# Patient Record
Sex: Female | Born: 1935
Health system: Southern US, Community
[De-identification: ages and names within clinical notes are randomized; demographics above are authoritative.]

## PROBLEM LIST (undated history)

## (undated) DIAGNOSIS — F418 Other specified anxiety disorders: Secondary | ICD-10-CM

## (undated) DIAGNOSIS — D649 Anemia, unspecified: Secondary | ICD-10-CM

## (undated) DIAGNOSIS — E871 Hypo-osmolality and hyponatremia: Secondary | ICD-10-CM

## (undated) DIAGNOSIS — R600 Localized edema: Secondary | ICD-10-CM

## (undated) DIAGNOSIS — Z8619 Personal history of other infectious and parasitic diseases: Secondary | ICD-10-CM

## (undated) DIAGNOSIS — Z5189 Encounter for other specified aftercare: Secondary | ICD-10-CM

## (undated) DIAGNOSIS — M1990 Unspecified osteoarthritis, unspecified site: Secondary | ICD-10-CM

## (undated) DIAGNOSIS — H269 Unspecified cataract: Secondary | ICD-10-CM

## (undated) DIAGNOSIS — L0390 Cellulitis, unspecified: Secondary | ICD-10-CM

## (undated) DIAGNOSIS — F419 Anxiety disorder, unspecified: Secondary | ICD-10-CM

## (undated) DIAGNOSIS — E669 Obesity, unspecified: Secondary | ICD-10-CM

## (undated) DIAGNOSIS — I739 Peripheral vascular disease, unspecified: Secondary | ICD-10-CM

## (undated) DIAGNOSIS — I10 Essential (primary) hypertension: Secondary | ICD-10-CM

## (undated) DIAGNOSIS — R32 Unspecified urinary incontinence: Secondary | ICD-10-CM

## (undated) DIAGNOSIS — K219 Gastro-esophageal reflux disease without esophagitis: Secondary | ICD-10-CM

## (undated) DIAGNOSIS — E785 Hyperlipidemia, unspecified: Secondary | ICD-10-CM

## (undated) DIAGNOSIS — K449 Diaphragmatic hernia without obstruction or gangrene: Secondary | ICD-10-CM

## (undated) HISTORY — PX: TRACHEOSTOMY CLOSURE: SHX458

## (undated) HISTORY — DX: Personal history of other infectious and parasitic diseases: Z86.19

## (undated) HISTORY — DX: Encounter for other specified aftercare: Z51.89

## (undated) HISTORY — DX: Anxiety disorder, unspecified: F41.9

## (undated) HISTORY — DX: Gastro-esophageal reflux disease without esophagitis: K21.9

## (undated) HISTORY — PX: APPENDECTOMY: SHX54

## (undated) HISTORY — DX: Unspecified cataract: H26.9

## (undated) HISTORY — DX: Hypo-osmolality and hyponatremia: E87.1

## (undated) HISTORY — PX: TONSILLECTOMY: SUR1361

## (undated) HISTORY — PX: OTHER SURGICAL HISTORY: SHX169

## (undated) HISTORY — PX: TRACHEOSTOMY: SUR1362

## (undated) HISTORY — DX: Essential (primary) hypertension: I10

## (undated) HISTORY — PX: SKIN GRAFT: SHX250

## (undated) HISTORY — DX: Diaphragmatic hernia without obstruction or gangrene: K44.9

## (undated) HISTORY — DX: Hyperlipidemia, unspecified: E78.5

## (undated) HISTORY — DX: Unspecified urinary incontinence: R32

## (undated) HISTORY — DX: Localized edema: R60.0

## (undated) HISTORY — DX: Cellulitis, unspecified: L03.90

## (undated) HISTORY — DX: Anemia, unspecified: D64.9

## (undated) HISTORY — DX: Obesity, unspecified: E66.9

## (undated) HISTORY — PX: JEJUNOSTOMY FEEDING TUBE: SUR737

## (undated) HISTORY — DX: Peripheral vascular disease, unspecified: I73.9

## (undated) HISTORY — DX: Other specified anxiety disorders: F41.8

## (undated) HISTORY — PX: COSMETIC SURGERY: SHX468

## (undated) HISTORY — DX: Unspecified osteoarthritis, unspecified site: M19.90

---

## 2012-09-16 ENCOUNTER — Ambulatory Visit (INDEPENDENT_AMBULATORY_CARE_PROVIDER_SITE_OTHER): Payer: Medicare Other | Admitting: Family Medicine

## 2012-09-16 VITALS — BP 133/68 | HR 72 | Temp 97.7°F | Resp 16 | Ht 64.0 in | Wt 226.4 lb

## 2012-09-16 DIAGNOSIS — L02419 Cutaneous abscess of limb, unspecified: Secondary | ICD-10-CM

## 2012-09-16 DIAGNOSIS — I8310 Varicose veins of unspecified lower extremity with inflammation: Secondary | ICD-10-CM

## 2012-09-16 DIAGNOSIS — I872 Venous insufficiency (chronic) (peripheral): Secondary | ICD-10-CM

## 2012-09-16 DIAGNOSIS — L03115 Cellulitis of right lower limb: Secondary | ICD-10-CM

## 2012-09-16 DIAGNOSIS — R600 Localized edema: Secondary | ICD-10-CM

## 2012-09-16 DIAGNOSIS — R609 Edema, unspecified: Secondary | ICD-10-CM

## 2012-09-16 MED ORDER — NYSTATIN 100000 UNIT/GM EX CREA: TOPICAL_CREAM | Freq: Two times a day (BID) | CUTANEOUS | Status: DC
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
## 2012-09-16 NOTE — Patient Instructions (Signed)
Primary care: [email or web address]  Continue clindamycin 4 times per day as prescribed by your previous doctor,  Keep legs elevated as able, compressive stockings, and ok to use nystatin cream twice per day. Lasix as prescribed by your previous primary provider.  Recheck in 2 days. Return to the clinic or go to the nearest emergency room if any of your symptoms worsen or new symptoms occur.   Cellulitis Cellulitis is an infection of the skin and the tissue beneath it. The infected area is usually red and tender. Cellulitis occurs most often in the arms and lower legs.  CAUSES  Cellulitis is caused by bacteria that enter the skin through cracks or cuts in the skin. The most common types of bacteria that cause cellulitis are Staphylococcus and Streptococcus. SYMPTOMS   Redness and warmth.  Swelling.  Tenderness or pain.  Fever. DIAGNOSIS  Your caregiver can usually determine what is wrong based on a physical exam. Blood tests may also be done. TREATMENT  Treatment usually involves taking an antibiotic medicine. HOME CARE INSTRUCTIONS   Take your antibiotics as directed. Finish them even if you start to feel better.  Keep the infected arm or leg elevated to reduce swelling.  Apply a warm cloth to the affected area up to 4 times per day to relieve pain.  Only take over-the-counter or prescription medicines for pain, discomfort, or fever as directed by your caregiver.  Keep all follow-up appointments as directed by your caregiver. SEEK MEDICAL CARE IF:   You notice red streaks coming from the infected area.  Your red area gets larger or turns dark in color.  Your bone or joint underneath the infected area becomes painful after the skin has healed.  Your infection returns in the same area or another area.  You notice a swollen bump in the infected area.  You develop new symptoms. SEEK IMMEDIATE MEDICAL CARE IF:   You have a fever.  You feel  very sleepy.  You develop vomiting or diarrhea.  You have a general ill feeling (malaise) with muscle aches and pains. MAKE SURE YOU:   Understand these instructions.  Will watch your condition.  Will get help right away if you are not doing well or get worse. Document Released: 11/03/2004 Document Revised: 07/26/2011 Document Reviewed: 04/11/2011 [facility] Patient Information 2014 [place], [place].   Stasis Dermatitis Stasis dermatitis occurs when veins lose the ability to pump blood back to the heart (poor venous circulation). It causes a reddish-purple to brownish scaly, itchy rash on the legs. The rash comes from pooling of blood (stasis). CAUSES  This occurs because the veins do not work very well anymore or because pressure may be increased in the veins due to other conditions. With blood pooling, the increased pressure in the tiny blood vessels (capillaries) causes fluid to leak out of the capillaries into the tissue. The extra fluid makes it harder for the blood to feed the cells and get rid of waste products. SYMPTOMS  Stasis dermatitis appears as red, scaly, itchy patches on the legs. A yellowish or light brown discoloration is also present. Due to scratching or other injury, these patches can become an ulcer. This ulcer may remain for long periods of time. The ulcer can also become infected. Swelling of the legs is often present with stasis dermatitis. If the leg is swollen, this increases the risk of infection and further damage to the skin. Sometimes, intense itching, tingling, and burning occurs before signs of stasis dermatitis appear. You may find  yourself scratching the insides of your ankles or rubbing your ankles together before the rash appears. After healing, there are often brown spots on the affected skin. DIAGNOSIS  Your caregiver makes this diagnosis based on an exam. Other tests may be done to better understand the cause. TREATMENT  If underlying conditions are  present, they must be treated. Some of these conditions are heart failure, thyroid problems, poor nutrition, and varicose veins.  Cortisone creams and ointments applied to the skin (topically) may be needed, as well as medicine to reduce swelling in the legs (diuretics).  Compression stockings or an elastic wrap may also be needed to reduce swelling.  If there is an infection, antibiotic medicines may also be used. HOME CARE INSTRUCTIONS   Try to rest and raise (elevate) the affected leg above the level of the heart, if possible.  Burow's solution wet packs applied for 30 minutes, 3 times daily, will help the weepy rash. Stop using the packs before your skin gets too dry. You can also use a mixture of 3 parts white vinegar to 1 quart water.  Grease your legs daily with ointments, such as petroleum jelly, to fight dryness.  Avoid scratching or injuring the area. SEEK IMMEDIATE MEDICAL CARE IF:   Your rash gets worse.  An ulcer forms.  You have an oral temperature above 102 F (38.9 C), not controlled by medicine.  You have any other severe symptoms. Document Released: 05/05/2005 Document Revised: 04/18/2011 Document Reviewed: 06/15/2009 [facility] Patient Information 2014 [place], [place].

## 2012-09-18 ENCOUNTER — Ambulatory Visit (INDEPENDENT_AMBULATORY_CARE_PROVIDER_SITE_OTHER): Payer: Medicare Other | Admitting: Family Medicine

## 2012-09-18 VITALS — BP 130/84 | HR 69 | Temp 97.7°F | Resp 18 | Ht 64.0 in | Wt 225.0 lb

## 2012-09-18 DIAGNOSIS — L02419 Cutaneous abscess of limb, unspecified: Secondary | ICD-10-CM

## 2012-09-18 DIAGNOSIS — I8310 Varicose veins of unspecified lower extremity with inflammation: Secondary | ICD-10-CM

## 2012-09-18 DIAGNOSIS — R600 Localized edema: Secondary | ICD-10-CM

## 2012-09-18 DIAGNOSIS — L03119 Cellulitis of unspecified part of limb: Secondary | ICD-10-CM

## 2012-09-18 DIAGNOSIS — I872 Venous insufficiency (chronic) (peripheral): Secondary | ICD-10-CM

## 2012-09-18 DIAGNOSIS — E871 Hypo-osmolality and hyponatremia: Secondary | ICD-10-CM

## 2012-09-18 DIAGNOSIS — R609 Edema, unspecified: Secondary | ICD-10-CM

## 2012-09-18 LAB — BASIC METABOLIC PANEL
BUN: 21 mg/dL (ref 6–23)
CO2: 27 mEq/L (ref 19–32)
Chloride: 96 mEq/L (ref 96–112)
Creat: 0.79 mg/dL (ref 0.50–1.10)
Glucose, Bld: 95 mg/dL (ref 70–99)
Potassium: 4.3 mEq/L (ref 3.5–5.3)

## 2012-09-18 MED ORDER — FUROSEMIDE 20 MG PO TABS: 20.0000 mg | ORAL_TABLET | ORAL | Status: DC

## 2012-09-18 NOTE — Progress Notes (Signed)
  Subjective:    Patient ID: Chelsea Browning, female    DOB: 22-Sep-1932, 79 y.o.   MRN: [id number]  HPI Chelsea Browning is a 79 y.o. female See last ov 2 days ago - here for follow up stasis dermatitis/cellulitis.  Using the nystatin cream - feels like this is helping.  Taking clindamycin since 09/15/12 - has had C diff in January of  last year, resolved with Flagyl. Taking yogurt and acidophilus.  No fever. Redness lessening in legs, less warm.    Only took 1 lasix. Ran out - needs refill. Has had low sodium in past - had to have bag of sodium here 2 years ago. Na 133 July 7th at PCP - plan to recheck in September. On water restriction for hyponatremia. Usually feels fatigued if it runs low. Last creatinine in normal range in July. No known hx of kidney disease. Was taking lasix about twice per week. - prior recommendation 3 x/week.    Has appt October 28th with [facility].   Review of Systems  Constitutional: Negative for fever, chills and fatigue.  Respiratory: Negative for shortness of breath.   Cardiovascular: Positive for leg swelling.  Gastrointestinal: Negative for abdominal pain and diarrhea.  Skin: Positive for color change.         Objective:   Physical Exam  Vitals reviewed. Constitutional: She is oriented to person, place, and time. She appears well-developed and well-nourished. No distress.  HENT:  Head: Normocephalic and atraumatic.  Eyes: Conjunctivae and EOM are normal. Pupils are equal, round, and reactive to light.  Neck: Carotid bruit is not present.  Cardiovascular:  Pulses:      Dorsalis pedis pulses are 1+ on the right side, and 1+ on the left side.  Pulmonary/Chest: Effort normal.  Abdominal: Soft. She exhibits no pulsatile midline mass. There is no tenderness.  Neurological: She is alert and oriented to person, place, and time.  Skin: Skin is warm and dry. There is erythema.     Psychiatric: She has a normal mood and affect. Her behavior is  normal.       Assessment & Plan:  Chelsea Browning is a 79 y.o. female Leg edema - Plan: furosemide (LASIX) 20 MG tablet 2x/week for now - may need additional dosing, but with hx of hyponatremia - check BMP, stay at current dosing for now.   Stasis dermatitis of both legs, with cellulitis - improving. Continue clindamycin and nystatin top for now - goal of 7-10 days of treatment.  rtc if any diarrhea as hx of cdiff prior.    Hx of hyponatremia - asx currently and prior Na 133.  BMP as above. rtc precautions.    Meds ordered this encounter  Medications  . furosemide (LASIX) 20 MG tablet    Sig: Take 1 tablet (20 mg total) by mouth 2 (two) times a week.    Dispense:  30 tablet    Refill:  0   Patient Instructions  Continue clindamycin for now.  Recheck Saturday 09/15/12 8am - 2pm, or Sunday 09/16/12 10am - 4pm.  Lasix sent to your pharmacy - 2 times per week for now. Stockings as discussed. Elevate legs, ok to continue nystatin cream and clindamycin for now. If any diarrhea - return to clinic to discuss.  Return to the clinic or go to the nearest emergency room if any of your symptoms worsen or new symptoms occur.

## 2012-09-18 NOTE — Patient Instructions (Addendum)
Continue clindamycin for now.  Recheck Saturday 09/15/12 8am - 2pm, or Sunday 09/16/12 10am - 4pm.  Lasix sent to your pharmacy - 2 times per week for now. Stockings as discussed. Elevate legs, ok to continue nystatin cream and clindamycin for now. If any diarrhea - return to clinic to discuss.  Return to the clinic or go to the nearest emergency room if any of your symptoms worsen or new symptoms occur.

## 2012-09-20 ENCOUNTER — Telehealth: Payer: Self-pay

## 2012-09-20 NOTE — Telephone Encounter (Signed)
Patient called concerned over lab results. Was told she would receive a call today. Please call at [phone].

## 2012-09-21 ENCOUNTER — Encounter: Payer: Self-pay | Admitting: Family Medicine

## 2012-09-21 NOTE — Telephone Encounter (Signed)
Please advise on lab results.

## 2012-09-21 NOTE — Telephone Encounter (Signed)
See lab notes. Thanks.  

## 2012-09-23 ENCOUNTER — Ambulatory Visit (INDEPENDENT_AMBULATORY_CARE_PROVIDER_SITE_OTHER): Payer: Medicare Other | Admitting: Family Medicine

## 2012-09-23 VITALS — BP 156/70 | HR 69 | Temp 98.9°F | Resp 16 | Ht 64.0 in | Wt 226.0 lb

## 2012-09-23 DIAGNOSIS — L0291 Cutaneous abscess, unspecified: Secondary | ICD-10-CM

## 2012-09-23 DIAGNOSIS — I8310 Varicose veins of unspecified lower extremity with inflammation: Secondary | ICD-10-CM

## 2012-09-23 DIAGNOSIS — E876 Hypokalemia: Secondary | ICD-10-CM

## 2012-09-23 DIAGNOSIS — L0390 Cellulitis, unspecified: Secondary | ICD-10-CM

## 2012-09-23 DIAGNOSIS — E871 Hypo-osmolality and hyponatremia: Secondary | ICD-10-CM

## 2012-09-23 LAB — BASIC METABOLIC PANEL WITH GFR
BUN: 29 mg/dL — ABNORMAL HIGH (ref 6–23)
CO2: 23 meq/L (ref 19–32)
Calcium: 9.1 mg/dL (ref 8.4–10.5)
Chloride: 100 meq/L (ref 96–112)
Creat: 0.93 mg/dL (ref 0.50–1.10)
Glucose, Bld: 114 mg/dL — ABNORMAL HIGH (ref 70–99)
Potassium: 4.8 meq/L (ref 3.5–5.3)
Sodium: 129 meq/L — ABNORMAL LOW (ref 135–145)

## 2012-09-23 NOTE — Progress Notes (Signed)
Subjective:    Patient ID: Chelsea Browning, female    DOB: 09-17-1932, 79 y.o.   MRN: [id number]  HPI Chelsea Browning is a 79 y.o. female See prior ov's.   Stasis dermatitis/cellulitis of legs.  Last ov 09/18/12. Improving/stable then on clindamycin and nystatin topical.  Feels much better.  Less red, no fever, not as warm.  Still taking clindamycin -s/p 1 week of this. No diarrhea. Still taking probiotic yogurt.   Hyponatremia - borderline low in past as well.  See prior labs below.  Here for follow up. Feels tired at times. Lowest in past 128or 129? Has only taken 1 lasix since last ov.     Results for orders placed in visit on 09/18/12  BASIC METABOLIC PANEL      Result Value Range   Sodium 130 (*) 135 - 145 mEq/L   Potassium 4.3  3.5 - 5.3 mEq/L   Chloride 96  96 - 112 mEq/L   CO2 27  19 - 32 mEq/L   Glucose, Bld 95  70 - 99 mg/dL   BUN 21  6 - 23 mg/dL   Creat 4.78  2.95 - 6.21 mg/dL   Calcium 9.8  8.4 - 30.8 mg/dL   Review of Systems  Constitutional: Negative for fever and chills.  Respiratory: Negative for shortness of breath.   Musculoskeletal: Negative for myalgias.  Skin: Positive for rash (less redness. ).       Objective:   Physical Exam  Vitals reviewed. Constitutional: She is oriented to person, place, and time. She appears well-developed and well-nourished.  HENT:  Head: Normocephalic.  Pulmonary/Chest: Effort normal.  Musculoskeletal: She exhibits edema (1 + le's bilaterally. ).  Neurological: She is alert and oriented to person, place, and time.  Skin: Skin is warm and dry. There is erythema (min erythema with stasis changes of lower legs, dry skin , but without warmth. few dry abraded areas on l lower leg. ).  Psychiatric: She has a normal mood and affect. Her behavior is normal.        Assessment & Plan:  Chelsea Browning is a 79 y.o. female Stasis dermatitis, unspecified laterality Cellulitis - improving.  Less warmth and symptomatically feels like  at baseline. continue clindamycin for 3 more days, nystatin topical for 3 more days (for total 10 days treatment).  Continue eucerin or aveeno for dry skin, compression stockings, elevation, and rtc precautions.   Hyponatremia - Plan: Basic metabolic panel.  Ok to hold on lasix for now as LE's appear improved. Recheck BMP, then decide on further workup. ER/RTC precautions.   Discussed primary care options - has paperwork for [facility], but also requested info on provider associated with teaching hospital.  Numbers given for [facility] and Internal Medicine teaching program. Can also check into [facility] if needed.   Patient Instructions  You should receive a call or letter about your lab results within the next week to 10 days.  3 more days of clindamycin and nystatin, then recheck if not continuing to improve or redness returns. continue eucerin or Aveeno for dry skin, elevate legs, stockings for compression.  Can hold Lasix for now until result of sodium level.  [facility]: [phone]. Can call to determine if they are taking new patients as another option for primary care. There is also an Internal Medicine Residency Program at [facility] - the main hospital number is [phone]. Return to the clinic or go to the nearest emergency room if any  of your symptoms worsen or new symptoms occur.

## 2012-09-23 NOTE — Patient Instructions (Addendum)
You should receive a call or letter about your lab results within the next week to 10 days.  3 more days of clindamycin and nystatin, then recheck if not continuing to improve or redness returns. continue eucerin or Aveeno for dry skin, elevate legs, stockings for compression.  Can hold Lasix for now until result of sodium level.  [facility]: [phone]. Can call to determine if they are taking new patients as another option for primary care. There is also an Internal Medicine Residency Program at [facility] - the main hospital number is [phone]. Return to the clinic or go to the nearest emergency room if any of your symptoms worsen or new symptoms occur.

## 2012-09-25 NOTE — Telephone Encounter (Signed)
Pt calling again regarding labs.   [phone]

## 2012-09-26 ENCOUNTER — Telehealth: Payer: Self-pay

## 2012-09-26 NOTE — Telephone Encounter (Signed)
Pt wants to know her lab results she is very anxious she says!!! Call back number is [phone] or [phone]

## 2012-09-26 NOTE — Telephone Encounter (Signed)
Dr Neva Seat please review and comment.

## 2012-09-27 ENCOUNTER — Ambulatory Visit (INDEPENDENT_AMBULATORY_CARE_PROVIDER_SITE_OTHER): Payer: Medicare Other | Admitting: Family Medicine

## 2012-09-27 VITALS — BP 140/50 | HR 68 | Temp 98.6°F | Resp 17 | Ht 64.0 in | Wt 227.0 lb

## 2012-09-27 DIAGNOSIS — R600 Localized edema: Secondary | ICD-10-CM

## 2012-09-27 DIAGNOSIS — E871 Hypo-osmolality and hyponatremia: Secondary | ICD-10-CM

## 2012-09-27 DIAGNOSIS — R609 Edema, unspecified: Secondary | ICD-10-CM

## 2012-09-27 NOTE — Progress Notes (Signed)
Subjective:    Patient ID: Chelsea Browning, female    DOB: April 05, 1932, 79 y.o.   MRN: [id number]  HPI Chelsea Browning is a 79 y.o. female  See prior office visits. intially seen 09/16/12 for LE cellulitis/stasis dermatitis after driving from [place]. Prior primary care provider in [place] and will be establishing primary care locally. Stasis derm/cellulitis improving last ov, but asked to return for discussion of hyponatremia. Still on clindamycin and nystatin cream - legs much better. Not much swelling. Stopping antibiotics tonight. appt with PCP on Sept 28th?  Hyponatremia - apparetnly Na 133 at PCP office in [facility] July 7th, then 130 on 09/18/12 here. Noted to me it has been low in past as well, with lowest in past 118? Had only taken 1 lasix prior to last ov, and held since then. Na 129 on 09/23/12. Has taken lasix a total of 3 times since moving him. Feels fatigued, but no headache, n/v, or diarrhea.  No hx of seizures. Decreased energy. Similar to when low in the past - lowest 118. Had sodium infusion then in emergency room.  No trouble breathing, no chest pain. Has had low sodium for about 4-5 years and has been on fluid restriction of 4 glasses of water during the day,    Urinary frequency at night with leaking - chronic, started on Detrol by OBGYN few years ago, not taking recently (no Detrol LA  in months).    Takes Persantine twice per day, mother had carotid artery blockage.  Has been on persantine for few years for prevention of Carotid artery disease, started by primary care doctor few years. Has ultrasound of carotids yearly, but has been ok by report - about 12-13%. No hx of CHF.  Results for orders placed in visit on 09/23/12  BASIC METABOLIC PANEL      Result Value Range   Sodium 129 (*) 135 - 145 mEq/L   Potassium 4.8  3.5 - 5.3 mEq/L   Chloride 100  96 - 112 mEq/L   CO2 23  19 - 32 mEq/L   Glucose, Bld 114 (*) 70 - 99 mg/dL   BUN 29 (*) 6 - 23 mg/dL   Creat 4.09  8.11 - 9.14 mg/dL   Calcium 9.1  8.4 - 78.2 mg/dL    Review of Systems  Constitutional: Positive for fatigue. Negative for fever and chills.  Respiratory: Negative for cough and shortness of breath.   Cardiovascular: Negative for chest pain.   And as above.       Objective:   Physical Exam  Vitals reviewed. Constitutional: She is oriented to person, place, and time. She appears well-developed and well-nourished.  HENT:  Head: Normocephalic and atraumatic.  Eyes: Conjunctivae and EOM are normal. Pupils are equal, round, and reactive to light.  Neck: Carotid bruit is not present.  Cardiovascular: Normal rate, regular rhythm, normal heart sounds and intact distal pulses.   Pulmonary/Chest: Effort normal and breath sounds normal. She has no wheezes. She has no rales.  Abdominal: Soft. She exhibits no pulsatile midline mass. There is no tenderness.  Musculoskeletal: She exhibits edema (1-2+ le pretibial with stasis changes, min erythema without apparent warmth. ).  Neurological: She is alert and oriented to person, place, and time.  Skin: Skin is warm and dry.  Psychiatric: She has a normal mood and affect. Her behavior is normal.      Assessment & Plan:  Chelsea Browning is a 79 y.o. female Hyponatremia - Plan: Comprehensive metabolic panel, TSH  Bilateral leg edema - Plan: Comprehensive metabolic panel, TSH   Hyponatremia - mild to moderate on recent testing, and only symptom of fatigue.  Chronic by history, and fluid restriction in past.  Consider hypertonic saline/ER eval if worsening levels, or new/worsening symptoms as only fatigue at present.  If worsening ha, n/v, or other worsening - to go to ER. Will check CMP (to look for cirrhosis and recheck Na) and TSH.  Consider BNP and other testing if these are normal. understanding expressed. Continue to avoid lasix.   Le edema improved. Can stop clindamycin, but watch for worsening, rtc precautions.   Outside records not available, and unsure of  reason for Persantine other than preventive with FH of Carotid stenosis (but by her report the recent ultrasound no obstruction), but did not see that this medicine contributes to hyponatremia. Can also be discussed with PCP.   Can discuss with PCP re: detrol LA.    Rtc/er precautions discussed.    Patient Instructions  You should receive a call or letter about your lab results within the next 4-5 days.  If any nausea, vomiting, headache, diarrhea, or worsening of fatigue (or other new worsening symptoms) go to the emergency room. Fluid restriction as discussed. No lasix for now.

## 2012-09-27 NOTE — Patient Instructions (Signed)
You should receive a call or letter about your lab results within the next 4-5 days.  If any nausea, vomiting, headache, diarrhea, or worsening of fatigue (or other new worsening symptoms) go to the emergency room. Fluid restriction as discussed. No lasix for now.

## 2012-09-28 LAB — COMPREHENSIVE METABOLIC PANEL
ALT: 16 U/L (ref 0–35)
Alkaline Phosphatase: 105 U/L (ref 39–117)
Creat: 0.85 mg/dL (ref 0.50–1.10)
Sodium: 135 mEq/L (ref 135–145)
Total Bilirubin: 0.5 mg/dL (ref 0.3–1.2)
Total Protein: 6.2 g/dL (ref 6.0–8.3)

## 2012-09-28 LAB — TSH: TSH: 1.043 u[IU]/mL (ref 0.350–4.500)

## 2012-09-29 ENCOUNTER — Other Ambulatory Visit: Payer: Self-pay | Admitting: Family Medicine

## 2012-09-29 DIAGNOSIS — E871 Hypo-osmolality and hyponatremia: Secondary | ICD-10-CM

## 2012-10-13 ENCOUNTER — Other Ambulatory Visit (INDEPENDENT_AMBULATORY_CARE_PROVIDER_SITE_OTHER): Payer: Medicare Other

## 2012-10-13 DIAGNOSIS — E871 Hypo-osmolality and hyponatremia: Secondary | ICD-10-CM

## 2012-10-13 LAB — BASIC METABOLIC PANEL
CO2: 27 mEq/L (ref 19–32)
Calcium: 9.2 mg/dL (ref 8.4–10.5)
Chloride: 104 mEq/L (ref 96–112)
Glucose, Bld: 88 mg/dL (ref 70–99)
Sodium: 135 mEq/L (ref 135–145)

## 2012-10-16 ENCOUNTER — Telehealth: Payer: Self-pay

## 2012-10-16 DIAGNOSIS — E871 Hypo-osmolality and hyponatremia: Secondary | ICD-10-CM

## 2012-10-16 NOTE — Telephone Encounter (Signed)
Pt called back to get results of labs. Gave pt normal levels including sodium. Pt wanted to ask Dr Neva Seat if he wants to repeat the Na test in another 2 weeks or when he wants her to follow up. Please advise.

## 2012-10-19 NOTE — Telephone Encounter (Signed)
It depends on when her appt is with new primary care.  If that is in next few weeks, can be repeated then.  If it is more time than that - I can do a lab order for BMP in 2-3 weeks for hyponatremia.

## 2012-10-19 NOTE — Telephone Encounter (Signed)
appt with PCP has already come and gone.  The new PCP did not check this, patient wants you to do this I have put in order for the BMP and patient will have this done in 2weeks. To you FYI

## 2012-11-04 ENCOUNTER — Other Ambulatory Visit (INDEPENDENT_AMBULATORY_CARE_PROVIDER_SITE_OTHER): Payer: Medicare Other

## 2012-11-04 DIAGNOSIS — E871 Hypo-osmolality and hyponatremia: Secondary | ICD-10-CM

## 2012-11-04 LAB — BASIC METABOLIC PANEL
BUN: 22 mg/dL (ref 6–23)
CO2: 28 mEq/L (ref 19–32)
Calcium: 9.5 mg/dL (ref 8.4–10.5)
Chloride: 102 mEq/L (ref 96–112)
Creat: 0.83 mg/dL (ref 0.50–1.10)

## 2012-11-08 ENCOUNTER — Telehealth: Payer: Self-pay

## 2012-11-08 ENCOUNTER — Encounter: Payer: Self-pay | Admitting: *Deleted

## 2012-11-08 NOTE — Telephone Encounter (Signed)
See labs-we sent a letter-pt notified of lab results.

## 2012-11-08 NOTE — Telephone Encounter (Signed)
Pt is very anxious to get her lab work about her sodium please call [phone] and if busy please use the cell at [phone]

## 2012-11-14 ENCOUNTER — Telehealth: Payer: Self-pay

## 2012-11-14 NOTE — Telephone Encounter (Signed)
Pt called to thank Denny Peon for sending her a copy of her labs. She said it was very important for her to have the info for her PCP and she just received it in the mail. I advised her that I will send a message to Denny Peon thanking her.

## 2012-12-04 ENCOUNTER — Ambulatory Visit: Payer: Medicare Other | Admitting: Internal Medicine

## 2013-01-18 ENCOUNTER — Ambulatory Visit (INDEPENDENT_AMBULATORY_CARE_PROVIDER_SITE_OTHER): Payer: Medicare Other | Admitting: Family Medicine

## 2013-01-18 VITALS — BP 116/16 | HR 64 | Temp 97.4°F | Resp 17 | Ht 64.0 in | Wt 227.0 lb

## 2013-01-18 DIAGNOSIS — L259 Unspecified contact dermatitis, unspecified cause: Secondary | ICD-10-CM

## 2013-01-18 DIAGNOSIS — B351 Tinea unguium: Secondary | ICD-10-CM

## 2013-01-18 DIAGNOSIS — L02419 Cutaneous abscess of limb, unspecified: Secondary | ICD-10-CM

## 2013-01-18 DIAGNOSIS — L239 Allergic contact dermatitis, unspecified cause: Secondary | ICD-10-CM

## 2013-01-18 LAB — POCT CBC
Granulocyte percent: 61.9 %G (ref 37–80)
HCT, POC: 31.4 % — AB (ref 37.7–47.9)
Hemoglobin: 9.5 g/dL — AB (ref 12.2–16.2)
MCV: 96 fL (ref 80–97)
POC Granulocyte: 3.4 (ref 2–6.9)
POC LYMPH PERCENT: 27.1 %L (ref 10–50)
RBC: 3.27 M/uL — AB (ref 4.04–5.48)
RDW, POC: 16.3 %

## 2013-01-18 LAB — COMPREHENSIVE METABOLIC PANEL
ALT: 14 U/L (ref 0–35)
Albumin: 4.1 g/dL (ref 3.5–5.2)
Alkaline Phosphatase: 112 U/L (ref 39–117)
CO2: 24 mEq/L (ref 19–32)
Glucose, Bld: 109 mg/dL — ABNORMAL HIGH (ref 70–99)
Potassium: 4.5 mEq/L (ref 3.5–5.3)
Sodium: 137 mEq/L (ref 135–145)
Total Bilirubin: 0.4 mg/dL (ref 0.3–1.2)
Total Protein: 6.3 g/dL (ref 6.0–8.3)

## 2013-01-18 MED ORDER — CEPHALEXIN 500 MG PO CAPS: 500.0000 mg | ORAL_CAPSULE | Freq: Four times a day (QID) | ORAL | Status: DC

## 2013-01-18 MED ORDER — METHYLPREDNISOLONE ACETATE 80 MG/ML IJ SUSP
80.0000 mg | Freq: Once | INTRAMUSCULAR | Status: AC
  Administered 2013-01-18: 80 mg via INTRAMUSCULAR

## 2013-01-18 MED ORDER — TRIAMCINOLONE ACETONIDE 0.1 % EX LOTN: 1.0000 "application " | TOPICAL_LOTION | Freq: Three times a day (TID) | CUTANEOUS | Status: DC

## 2013-01-18 NOTE — Progress Notes (Signed)
Subjective: Elderly lady who has a long history of cellulitis of her legs. She's been treated with antibiotics and not numerous times. She is allergic to doxycycline and Augmentin. She has been on a long course of clindamycin several times in the past, but last year had bad Clostridium from that. About 3 days ago her cellulitis started to a lot worse on her legs again. She is on terbinafine from a doctor in [place] for the fungus of her toenails. She apparently had just taken a course of sulfamethoxazole which she finished about 10 days ago for her cellulitis. 3 days ago she also broke out with a pruritic rash on her forearms and on her legs up to the lower thighs.  She took a Benadryl at midnight still groggy from that.  She also has apparently used some Neosporin on some places. She also uses some Silvadene on her legs.  Objective: Somewhat groggy but very talkative lady. Her daughter is with her. The rash is diffusely on her forearms and she has a bright red cellulitis of both legs from ankle to just below the knee. She has some vesicular areas which are weeping. She has some chronic stasis edema of both legs. She is very obese. She has onychomycosis on all of her toenails. She keeps scratching herself.  Assessment: Onychomycosis Cellulitis Allergic dermatitis, by pattern on the forearms and below the mid-thighs I believe it is something she is contacted rather than a systemic problem. There is no the truck. That makes sulfa allergy less likely though not impossible.  Plan: Culture leg Check CBC Depo-Medrol 80 Return in several days Triamcinolone cream Keflex 500 4 times a day Results for orders placed in visit on 01/18/13  POCT CBC      Result Value Range   WBC 5.5  4.6 - 10.2 K/uL   Lymph, poc 1.5  0.6 - 3.4   POC LYMPH PERCENT 27.1  10 - 50 %L   MID (cbc) 0.6  0 - 0.9   POC MID % 11.0  0 - 12 %M   POC Granulocyte 3.4  2 - 6.9   Granulocyte percent 61.9  37 - 80 %G   RBC  3.27 (*) 4.04 - 5.48 M/uL   Hemoglobin 9.5 (*) 12.2 - 16.2 g/dL   HCT, POC 91.4 (*) 78.2 - 47.9 %   MCV 96.0  80 - 97 fL   MCH, POC 29.1  27 - 31.2 pg   MCHC 30.3 (*) 31.8 - 35.4 g/dL   RDW, POC 95.6     Platelet Count, POC 201  142 - 424 K/uL   MPV 7.8  0 - 99.8 fL

## 2013-01-18 NOTE — Patient Instructions (Signed)
Take cephalexin 500 mg 4 times daily for cellulitis  Use triamcinolone lotion twice daily on the allergic rash  Continue your Silvadene on the skin ulcers from the cellulitis  Take Claritin or Allegra(loratadine or fexofenadine) one daily for antihistamine  Avoid scratching  Keep her legs elevated  Stop using any Neosporin. Continue to think carefully about anything else that could have caused an allergic rash. I do have a little concerned about you recently been on sulfa, and actually the Silvadene has some sulfa in it also. If it seems to make anything worse we will have to stop using it.  Return in about 3 days for recheck. Sooner if worse

## 2013-01-19 ENCOUNTER — Telehealth: Payer: Self-pay

## 2013-01-19 NOTE — Telephone Encounter (Signed)
Pt will be in on Monday for recheck.  Pt will stop Lamisil per Dr. Alwyn Ren for now until lab results come back in.  She also states that her rash has moved a little bit to her shoulders and back.

## 2013-01-19 NOTE — Telephone Encounter (Signed)
Patient saw Chelsea Browning yesterday and she said that her rash is getting worse.  It is now on her shoulders, chest, and back.  She is wondering if she should stop using the medication.  She is hoping to get a call back this morning.

## 2013-01-19 NOTE — Telephone Encounter (Signed)
I advise her to come back in for a recheck.

## 2013-01-21 ENCOUNTER — Ambulatory Visit (INDEPENDENT_AMBULATORY_CARE_PROVIDER_SITE_OTHER): Payer: Medicare Other | Admitting: Family Medicine

## 2013-01-21 VITALS — BP 132/78 | HR 57 | Temp 97.2°F | Resp 18 | Ht 62.5 in | Wt 232.6 lb

## 2013-01-21 DIAGNOSIS — L02419 Cutaneous abscess of limb, unspecified: Secondary | ICD-10-CM

## 2013-01-21 DIAGNOSIS — L97909 Non-pressure chronic ulcer of unspecified part of unspecified lower leg with unspecified severity: Secondary | ICD-10-CM

## 2013-01-21 DIAGNOSIS — L259 Unspecified contact dermatitis, unspecified cause: Secondary | ICD-10-CM

## 2013-01-21 DIAGNOSIS — I872 Venous insufficiency (chronic) (peripheral): Secondary | ICD-10-CM | POA: Insufficient documentation

## 2013-01-21 DIAGNOSIS — L97911 Non-pressure chronic ulcer of unspecified part of right lower leg limited to breakdown of skin: Secondary | ICD-10-CM

## 2013-01-21 LAB — WOUND CULTURE: Gram Stain: NONE SEEN

## 2013-01-21 NOTE — Progress Notes (Addendum)
Subjective: Here for recheck of the cellulitis. It has improved.  Objective: Cellulitis is much improved on her lower legs. There is still erythema, but it is not nearly the bright red that was last time. Ulcer is about the same, nice and clean. Her arms and lower thighs or not nearly as inflamed though still excoriated. She thinks it is from wool that makes her strength so much especially at nighttime.  Assessment: Cellulitis Leg ulcer Contact dermatitis Stasis dermatitis  Plan: Return next week for one more recheck. Have to to mark out the Keflex back to 3 times a day. With her history of clostridium she is to stop antibiotics immediately if she gets diarrhea.

## 2013-01-21 NOTE — Patient Instructions (Signed)
On Wednesday decrease the cephalexin to 3 times daily  Return for recheck Christmas Eve, sooner if problems  Elevate legs frequently as discussed. It is okay to use the compression stockings when you're up and around.  Try to let air get to the wound at least once a day for an hour or so

## 2013-01-30 ENCOUNTER — Ambulatory Visit (INDEPENDENT_AMBULATORY_CARE_PROVIDER_SITE_OTHER): Payer: Medicare Other | Admitting: Family Medicine

## 2013-01-30 VITALS — BP 132/68 | HR 70 | Temp 97.9°F | Resp 16 | Ht 63.75 in | Wt 235.0 lb

## 2013-01-30 DIAGNOSIS — L0291 Cutaneous abscess, unspecified: Secondary | ICD-10-CM

## 2013-01-30 DIAGNOSIS — D649 Anemia, unspecified: Secondary | ICD-10-CM | POA: Insufficient documentation

## 2013-01-30 DIAGNOSIS — I872 Venous insufficiency (chronic) (peripheral): Secondary | ICD-10-CM

## 2013-01-30 DIAGNOSIS — I8310 Varicose veins of unspecified lower extremity with inflammation: Secondary | ICD-10-CM

## 2013-01-30 DIAGNOSIS — L0390 Cellulitis, unspecified: Secondary | ICD-10-CM

## 2013-01-30 LAB — POCT CBC
HCT, POC: 32.3 % — AB (ref 37.7–47.9)
Hemoglobin: 9.9 g/dL — AB (ref 12.2–16.2)
Lymph, poc: 0.8 (ref 0.6–3.4)
MCHC: 30.7 g/dL — AB (ref 31.8–35.4)
MID (cbc): 0.4 (ref 0–0.9)
MPV: 7.7 fL (ref 0–99.8)
POC Granulocyte: 3.3 (ref 2–6.9)
POC LYMPH PERCENT: 17.6 %L (ref 10–50)
POC MID %: 8.1 %M (ref 0–12)
Platelet Count, POC: 192 10*3/uL (ref 142–424)
RDW, POC: 16.6 %

## 2013-01-30 LAB — BASIC METABOLIC PANEL
CO2: 27 mEq/L (ref 19–32)
Calcium: 9.4 mg/dL (ref 8.4–10.5)
Creat: 0.84 mg/dL (ref 0.50–1.10)
Sodium: 139 mEq/L (ref 135–145)

## 2013-01-30 LAB — FERRITIN: Ferritin: 65 ng/mL (ref 10–291)

## 2013-01-30 MED ORDER — MUPIROCIN 2 % EX OINT: 1.0000 "application " | TOPICAL_OINTMENT | Freq: Three times a day (TID) | CUTANEOUS | Status: DC

## 2013-01-30 NOTE — Progress Notes (Signed)
Subjective: Patient is here for a recheck of her cellulitis her legs. She says it's continued to improve. Not oozing as much. No new complaints.  Objective: We discussed her anemia from last visit Cellulitis has less erythema and the area of it has continued to decrease. She does have several little sore still but they're healing in. She has been using Silvadene on them, and we talked about changing to mupirocin. 2-3+ pitting edema  Assessment: Cellulitis, edema, anemia  Plan  Results for orders placed in visit on 01/30/13  POCT CBC      Result Value Range   WBC 4.5 (*) 4.6 - 10.2 K/uL   Lymph, poc 0.8  0.6 - 3.4   POC LYMPH PERCENT 17.6  10 - 50 %L   MID (cbc) 0.4  0 - 0.9   POC MID % 8.1  0 - 12 %M   POC Granulocyte 3.3  2 - 6.9   Granulocyte percent 74.3  37 - 80 %G   RBC 3.36 (*) 4.04 - 5.48 M/uL   Hemoglobin 9.9 (*) 12.2 - 16.2 g/dL   HCT, POC 19.1 (*) 47.8 - 47.9 %   MCV 96.0  80 - 97 fL   MCH, POC 29.5  27 - 31.2 pg   MCHC 30.7 (*) 31.8 - 35.4 g/dL   RDW, POC 29.5     Platelet Count, POC 192  142 - 424 K/uL   MPV 7.7  0 - 99.8 fL   Check stool Hemoccult See her back in 2 weeks Let me know if the cellulitis does not continue to improve after she finishes the Keflex in another 3 or 4 days. I may have to extend the course of the antibiotic were changed to a different one. They're many antibiotics she did not tolerate.

## 2013-01-30 NOTE — Patient Instructions (Signed)
Drink plenty of fluids  Get sufficient rest heating your legs elevated.  Use the mupirocin ointment on the sore areas  Continue out the antibiotics  We will let you know the results of your labs.  I think I would like to check this again in a couple of weeks unless you have any concerns it is getting worse. You will need to call and find out my schedule since it is not yet finalized

## 2013-02-04 ENCOUNTER — Telehealth: Payer: Self-pay | Admitting: Family Medicine

## 2013-02-04 LAB — IFOBT (OCCULT BLOOD): IFOBT: POSITIVE

## 2013-02-04 NOTE — Telephone Encounter (Signed)
Informed pt about diminished kidney function. Encouraged more water and less salt. She is coming Jan 8 for a recheck.

## 2013-02-04 NOTE — Telephone Encounter (Signed)
Message copied by Tilman Neat on Mon Feb 04, 2013  8:58 AM ------      Message from: HOPPER, DAVID H      Created: Fri Feb 01, 2013  8:36 PM       Call:      The labs are good except for the kidney function is not rinsing the waste products out as well as they should.  I still encourage drinking more water, but do not eat too much salt.             Although she is anemic, her iron level is okay.  We will see what it does, and if it persists I many need to send her to a hematologist to get checked. ------

## 2013-02-04 NOTE — Telephone Encounter (Signed)
LMOM to CB. 

## 2013-02-04 NOTE — Telephone Encounter (Signed)
Message copied by Tilman Neat on Mon Feb 04, 2013  8:38 AM ------      Message from: HOPPER, DAVID H      Created: Fri Feb 01, 2013  8:36 PM       Call:      The labs are good except for the kidney function is not rinsing the waste products out as well as they should.  I still encourage drinking more water, but do not eat too much salt.             Although she is anemic, her iron level is okay.  We will see what it does, and if it persists I many need to send her to a hematologist to get checked. ------

## 2013-02-12 ENCOUNTER — Telehealth: Payer: Self-pay

## 2013-02-12 ENCOUNTER — Other Ambulatory Visit: Payer: Self-pay

## 2013-02-12 DIAGNOSIS — K921 Melena: Secondary | ICD-10-CM

## 2013-02-12 NOTE — Telephone Encounter (Signed)
Referral was made to GI , to [facility] patient states she would like Dr Deatra Ina, have asked Becky in Referrals to send to Dr Deatra Ina.

## 2013-02-12 NOTE — Telephone Encounter (Signed)
Pt states someone called their house this morning and spoke with her husband, but the pt has questions regarding a referral that is going to be made on her behalf

## 2013-02-14 ENCOUNTER — Ambulatory Visit (INDEPENDENT_AMBULATORY_CARE_PROVIDER_SITE_OTHER): Payer: Medicare HMO | Admitting: Family Medicine

## 2013-02-14 VITALS — BP 132/82 | HR 66 | Temp 98.6°F | Resp 17 | Ht 64.0 in | Wt 235.0 lb

## 2013-02-14 DIAGNOSIS — L02419 Cutaneous abscess of limb, unspecified: Secondary | ICD-10-CM

## 2013-02-14 DIAGNOSIS — L03116 Cellulitis of left lower limb: Principal | ICD-10-CM

## 2013-02-14 DIAGNOSIS — I8310 Varicose veins of unspecified lower extremity with inflammation: Secondary | ICD-10-CM

## 2013-02-14 DIAGNOSIS — I872 Venous insufficiency (chronic) (peripheral): Secondary | ICD-10-CM

## 2013-02-14 DIAGNOSIS — R609 Edema, unspecified: Secondary | ICD-10-CM

## 2013-02-14 DIAGNOSIS — L03119 Cellulitis of unspecified part of limb: Secondary | ICD-10-CM

## 2013-02-14 DIAGNOSIS — L03115 Cellulitis of right lower limb: Secondary | ICD-10-CM

## 2013-02-14 DIAGNOSIS — R7989 Other specified abnormal findings of blood chemistry: Secondary | ICD-10-CM

## 2013-02-14 LAB — POCT CBC
Granulocyte percent: 66.8 %G (ref 37–80)
HEMATOCRIT: 32.8 % — AB (ref 37.7–47.9)
HEMOGLOBIN: 9.9 g/dL — AB (ref 12.2–16.2)
LYMPH, POC: 1.1 (ref 0.6–3.4)
MCH: 28.9 pg (ref 27–31.2)
MCHC: 30.2 g/dL — AB (ref 31.8–35.4)
MCV: 95.7 fL (ref 80–97)
MID (cbc): 0.4 (ref 0–0.9)
MPV: 6.7 fL (ref 0–99.8)
POC Granulocyte: 2.9 (ref 2–6.9)
POC LYMPH PERCENT: 24.3 %L (ref 10–50)
POC MID %: 8.9 %M (ref 0–12)
Platelet Count, POC: 226 10*3/uL (ref 142–424)
RBC: 3.43 M/uL — AB (ref 4.04–5.48)
RDW, POC: 16.5 %
WBC: 4.4 10*3/uL — AB (ref 4.6–10.2)

## 2013-02-14 LAB — BASIC METABOLIC PANEL
BUN: 28 mg/dL — AB (ref 6–23)
CHLORIDE: 103 meq/L (ref 96–112)
CO2: 27 mEq/L (ref 19–32)
Calcium: 9.6 mg/dL (ref 8.4–10.5)
Creat: 0.82 mg/dL (ref 0.50–1.10)
Glucose, Bld: 103 mg/dL — ABNORMAL HIGH (ref 70–99)
Potassium: 4.5 mEq/L (ref 3.5–5.3)
Sodium: 138 mEq/L (ref 135–145)

## 2013-02-14 NOTE — Progress Notes (Signed)
Subjective: Patient is feeling fairly well. She is here for a followup with regard to her stasis dermatitis. She finds if she stretches her legs during the night.  Objective: Much less erythema. She still has 3+ pitting edema of her ankles and shins. Her legs both have stasis dermatitis there is just some superficial ulcerations on the medial aspect of the left leg where she is has scratched herself.  Chest clear. Heart regular.  Reviewed her recent labs with her and discussed them.  Assessment: Stasis dermatitis and superficial ulcerations Itching of legs Obesity Edema Renal insufficiency/azotemia  Plan: Drink plenty of fluids and watch her salt intake. Recheck her in about 3 weeks.   Results for orders placed in visit on 02/14/13  POCT CBC      Result Value Range   WBC 4.4 (*) 4.6 - 10.2 K/uL   Lymph, poc 1.1  0.6 - 3.4   POC LYMPH PERCENT 24.3  10 - 50 %L   MID (cbc) 0.4  0 - 0.9   POC MID % 8.9  0 - 12 %M   POC Granulocyte 2.9  2 - 6.9   Granulocyte percent 66.8  37 - 80 %G   RBC 3.43 (*) 4.04 - 5.48 M/uL   Hemoglobin 9.9 (*) 12.2 - 16.2 g/dL   HCT, POC 32.8 (*) 37.7 - 47.9 %   MCV 95.7  80 - 97 fL   MCH, POC 28.9  27 - 31.2 pg   MCHC 30.2 (*) 31.8 - 35.4 g/dL   RDW, POC 16.5     Platelet Count, POC 226  142 - 424 K/uL   MPV 6.7  0 - 99.8 fL

## 2013-02-14 NOTE — Patient Instructions (Signed)
Drink more water on a regular basis  Avoid excessive salt  Keep legs elevated when possible  Wear cotton gloves at night to keep you from scratching her legs  Return in 3 weeks for recheck

## 2013-02-18 ENCOUNTER — Encounter: Payer: Self-pay | Admitting: Family Medicine

## 2013-02-20 ENCOUNTER — Telehealth: Payer: Self-pay

## 2013-02-20 NOTE — Telephone Encounter (Signed)
Pt notified of labs and that a copy was mailed.

## 2013-02-20 NOTE — Telephone Encounter (Signed)
Patient is calling to get the results of some bloodwork. Patient requested to speak with Erin.  [phone]

## 2013-02-25 ENCOUNTER — Encounter: Payer: Self-pay | Admitting: Podiatry

## 2013-02-25 ENCOUNTER — Ambulatory Visit (INDEPENDENT_AMBULATORY_CARE_PROVIDER_SITE_OTHER): Payer: Medicare HMO | Admitting: Podiatry

## 2013-02-25 VITALS — BP 173/73 | HR 64 | Resp 16

## 2013-02-25 DIAGNOSIS — B351 Tinea unguium: Secondary | ICD-10-CM

## 2013-02-25 DIAGNOSIS — M79609 Pain in unspecified limb: Secondary | ICD-10-CM

## 2013-02-25 NOTE — Progress Notes (Signed)
   Subjective:    Patient ID: Chelsea Browning, female    DOB: [birth year], 80 y.o.   MRN: [id number]  HPI Comments: "I need my feet checked"  Pt moved here in August and needs her regular check up. Trim the toenails. She is on lamisil for her nail fungus by infectious disease doc at [facility]. Doctor stated that it would help her cellulitis.     Review of Systems  Cardiovascular: Positive for chest pain, palpitations and leg swelling.       Calf pain with walking   Musculoskeletal: Positive for arthralgias, back pain and gait problem.  Neurological: Positive for dizziness, weakness and numbness.  All other systems reviewed and are negative.       Objective:   Physical Exam        Assessment & Plan:

## 2013-02-25 NOTE — Progress Notes (Signed)
Subjective:     Patient ID: Chelsea Browning, female   DOB: March 12, 1932, 80 y.o.   MRN: [id number]  HPI patient presents with painful nailbeds 1-5 both feet with long history of cellulitis edema in the legs and diminished circulatory status   Review of Systems  All other systems reviewed and are negative.       Objective:   Physical Exam  Constitutional: She is oriented to person, place, and time.  Musculoskeletal: Normal range of motion.  Neurological: She is oriented to person, place, and time.  Skin: Skin is warm.   neurovascular status mildly diminished with significant edema in the lower legs both feet diminished circulatory status neurological status and hair growth     Assessment:     Chronic painful mycotic nails 1-5 both feet with at risk condition surrounding the area    Plan:     H&P performed and debridement nailbeds 1-5 both feet with no bleeding noted reappoint her recheck in 3 months

## 2013-02-27 ENCOUNTER — Encounter: Payer: Self-pay | Admitting: Gastroenterology

## 2013-03-06 ENCOUNTER — Ambulatory Visit (INDEPENDENT_AMBULATORY_CARE_PROVIDER_SITE_OTHER): Payer: Medicare HMO | Admitting: Family Medicine

## 2013-03-06 VITALS — BP 170/60 | HR 69 | Temp 98.7°F | Resp 20

## 2013-03-06 DIAGNOSIS — I8310 Varicose veins of unspecified lower extremity with inflammation: Secondary | ICD-10-CM

## 2013-03-06 DIAGNOSIS — I872 Venous insufficiency (chronic) (peripheral): Secondary | ICD-10-CM | POA: Insufficient documentation

## 2013-03-06 DIAGNOSIS — R609 Edema, unspecified: Secondary | ICD-10-CM

## 2013-03-06 NOTE — Patient Instructions (Signed)
Continue to treat your legs in the same fashion. Elevate whenever possible. Wear the compression hose when you are going to need to have your legs down.  Take a regular aspirin before flying. Make sure you ambulate often or at least stretch her legs out outside the seat.  Return in about 2 months, sooner if problems

## 2013-03-06 NOTE — Progress Notes (Signed)
Subjective: Patient is here for a regular visit to followup on the stasis dermatitis. She feels like it doing better. She hopes to fly out to [place] next week to visit her friend for a week. She has bought some compression stockings.  Objective: Continues to have erythema two thirds of her lower leg, but is not as red as it has been at time. She does have some excoriated areas. We again discussed that.  Assessment: Stasis dermatitis and edema  Plan: Continue current treatments. Return in about 2 months for recheck.

## 2013-04-01 ENCOUNTER — Ambulatory Visit (INDEPENDENT_AMBULATORY_CARE_PROVIDER_SITE_OTHER): Payer: Commercial Managed Care - HMO | Admitting: Gastroenterology

## 2013-04-01 ENCOUNTER — Encounter: Payer: Self-pay | Admitting: Gastroenterology

## 2013-04-01 VITALS — BP 150/68 | HR 68 | Ht 62.5 in | Wt 229.0 lb

## 2013-04-01 DIAGNOSIS — K921 Melena: Secondary | ICD-10-CM

## 2013-04-01 DIAGNOSIS — D649 Anemia, unspecified: Secondary | ICD-10-CM

## 2013-04-01 NOTE — Assessment & Plan Note (Signed)
Anemia .  With Hemoccult-positive stool she could be suffering from chronic GI blood loss.  A colonic bleeding sources unlikely in the face of relatively recent colonoscopy.  Bleeding from AVMs, active peptic disease are possibilities (patient is on Prilosec) minimal rectal bleeding and heme-positive stool could be do to hemorrhoidal disease.    Recommendations #1 serial Hemoccults #2 upper endoscopy

## 2013-04-01 NOTE — Patient Instructions (Signed)
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
## 2013-04-03 ENCOUNTER — Ambulatory Visit (INDEPENDENT_AMBULATORY_CARE_PROVIDER_SITE_OTHER): Payer: Medicare HMO | Admitting: Family Medicine

## 2013-04-03 VITALS — BP 168/64 | HR 64 | Temp 97.8°F | Resp 16 | Ht 64.0 in | Wt 232.4 lb

## 2013-04-03 DIAGNOSIS — L02419 Cutaneous abscess of limb, unspecified: Secondary | ICD-10-CM

## 2013-04-03 DIAGNOSIS — L03119 Cellulitis of unspecified part of limb: Secondary | ICD-10-CM

## 2013-04-03 DIAGNOSIS — R300 Dysuria: Secondary | ICD-10-CM

## 2013-04-03 DIAGNOSIS — I872 Venous insufficiency (chronic) (peripheral): Secondary | ICD-10-CM

## 2013-04-03 DIAGNOSIS — D649 Anemia, unspecified: Secondary | ICD-10-CM

## 2013-04-03 DIAGNOSIS — I8310 Varicose veins of unspecified lower extremity with inflammation: Secondary | ICD-10-CM

## 2013-04-03 DIAGNOSIS — R350 Frequency of micturition: Secondary | ICD-10-CM

## 2013-04-03 LAB — POCT UA - MICROSCOPIC ONLY
CASTS, UR, LPF, POC: NEGATIVE
Crystals, Ur, HPF, POC: NEGATIVE
Mucus, UA: NEGATIVE
Yeast, UA: NEGATIVE

## 2013-04-03 LAB — POCT URINALYSIS DIPSTICK
Bilirubin, UA: NEGATIVE
Blood, UA: NEGATIVE
Glucose, UA: NEGATIVE
Ketones, UA: NEGATIVE
Leukocytes, UA: NEGATIVE
Nitrite, UA: NEGATIVE
Protein, UA: 100
Urobilinogen, UA: 0.2
pH, UA: 5.5

## 2013-04-03 LAB — POCT CBC
Granulocyte percent: 80.5 %G — AB (ref 37–80)
HCT, POC: 36.9 % — AB (ref 37.7–47.9)
Hemoglobin: 11.3 g/dL — AB (ref 12.2–16.2)
LYMPH, POC: 0.8 (ref 0.6–3.4)
MCH, POC: 29.1 pg (ref 27–31.2)
MCHC: 30.6 g/dL — AB (ref 31.8–35.4)
MCV: 95.2 fL (ref 80–97)
MID (CBC): 0.6 (ref 0–0.9)
MPV: 8 fL (ref 0–99.8)
PLATELET COUNT, POC: 178 10*3/uL (ref 142–424)
POC Granulocyte: 5.6 (ref 2–6.9)
POC LYMPH PERCENT: 11.5 %L (ref 10–50)
POC MID %: 8 % (ref 0–12)
RBC: 3.88 M/uL — AB (ref 4.04–5.48)
RDW, POC: 15.7 %
WBC: 7 10*3/uL (ref 4.6–10.2)

## 2013-04-03 MED ORDER — CEPHALEXIN 500 MG PO CAPS: 500.0000 mg | ORAL_CAPSULE | Freq: Four times a day (QID) | ORAL | Status: DC

## 2013-04-03 NOTE — Patient Instructions (Addendum)
Hold off on antibiotics yet. If the redness gets worse over the next couple of weeks, then go ahead and take another round of antibiotics.  Drink more fluids as discussed. Contact us if urinary symptoms get worse. I will check a culture and let you know if it shows any problems.  Return in 2

## 2013-04-03 NOTE — Progress Notes (Signed)
Subjective: Patient is continued to have problems with inflammation in her legs. She got enough cellulitis that she was unable to go on her trip to [place]. She did get a 5 day course of antibiotics from her PCP. She continues to scratch at her legs are good deal, and says she is going to get some gloves today.  Last night she had eaten some popcorn and had a very bad episode of gas within it gas ex did not help. She is feeling well this morning.  She has had some dysuria and use a topical numbing medication around the opening. Is here for a urine check.  Objective: Pleasant alert no acute distress. Chest clear. Heart regular without murmur. 1+ edema of the ankles. Has erythema but is not real angry looking of the whole area of cellulitis from her shins down. Multiple excoriations.   Results for orders placed in visit on 04/03/13  POCT UA - MICROSCOPIC ONLY      Result Value Ref Range   WBC, Ur, HPF, POC 2-5     RBC, urine, microscopic 0-3     Bacteria, U Microscopic 1+     Mucus, UA neg     Epithelial cells, urine per micros 3-5     Crystals, Ur, HPF, POC neg     Casts, Ur, LPF, POC neg     Yeast, UA neg    POCT URINALYSIS DIPSTICK      Result Value Ref Range   Color, UA yellow     Clarity, UA clear     Glucose, UA neg     Bilirubin, UA neg     Ketones, UA neg     Spec Grav, UA >=1.030     Blood, UA neg     pH, UA 5.5     Protein, UA 100     Urobilinogen, UA 0.2     Nitrite, UA neg     Leukocytes, UA Negative    POCT CBC      Result Value Ref Range   WBC 7.0  4.6 - 10.2 K/uL   Lymph, poc 0.8  0.6 - 3.4   POC LYMPH PERCENT 11.5  10 - 50 %L   MID (cbc) 0.6  0 - 0.9   POC MID % 8.0  0 - 12 %M   POC Granulocyte 5.6  2 - 6.9   Granulocyte percent 80.5 (*) 37 - 80 %G   RBC 3.88 (*) 4.04 - 5.48 M/uL   Hemoglobin 11.3 (*) 12.2 - 16.2 g/dL   HCT, POC 36.9 (*) 37.7 - 47.9 %   MCV 95.2  80 - 97 fL   MCH, POC 29.1  27 - 31.2 pg   MCHC 30.6 (*) 31.8 - 35.4 g/dL   RDW, POC 15.7      Platelet Count, POC 178  142 - 424 K/uL   MPV 8.0  0 - 99.8 fL    Assessment: Stasis dermatitis Dysuria Anemia improved  Plan: Hold off on antibiotics. If the legs are getting redder her more angry seeming over the next couple weeks she is to take a course of antibiotics for which is giving her a prescription today. Return in about 2 months for me to recheck her.

## 2013-04-04 LAB — URINE CULTURE

## 2013-04-06 ENCOUNTER — Encounter: Payer: Self-pay | Admitting: Family Medicine

## 2013-04-26 ENCOUNTER — Ambulatory Visit (INDEPENDENT_AMBULATORY_CARE_PROVIDER_SITE_OTHER): Payer: Medicare HMO | Admitting: Family Medicine

## 2013-04-26 VITALS — BP 180/66 | HR 66 | Temp 97.8°F | Resp 20 | Ht 64.0 in | Wt 231.0 lb

## 2013-04-26 DIAGNOSIS — L239 Allergic contact dermatitis, unspecified cause: Secondary | ICD-10-CM

## 2013-04-26 DIAGNOSIS — I872 Venous insufficiency (chronic) (peripheral): Secondary | ICD-10-CM

## 2013-04-26 DIAGNOSIS — L0390 Cellulitis, unspecified: Secondary | ICD-10-CM

## 2013-04-26 DIAGNOSIS — I8310 Varicose veins of unspecified lower extremity with inflammation: Secondary | ICD-10-CM

## 2013-04-26 MED ORDER — TRIAMCINOLONE ACETONIDE 0.1 % EX LOTN: 1.0000 "application " | TOPICAL_LOTION | Freq: Three times a day (TID) | CUTANEOUS | Status: DC

## 2013-04-26 MED ORDER — CEPHALEXIN 500 MG PO CAPS: 500.0000 mg | ORAL_CAPSULE | Freq: Four times a day (QID) | ORAL | Status: DC

## 2013-04-26 MED ORDER — DIPYRIDAMOLE 75 MG PO TABS: 75.0000 mg | ORAL_TABLET | Freq: Two times a day (BID) | ORAL | Status: DC

## 2013-04-26 NOTE — Progress Notes (Signed)
Subjective: Patient is here for a recheck with regard to the cellulitis or legs. The left leg is a little worse, red and warm. Both are red. She would like to be rechecked and put back on antibiotics. They itch she scratches them. She bumped it on the edge of an exercise bike the other day and got a new excoriation in the medial aspect of the left calf. She has a large cyst above her left knee which has been aspirated by her orthopedist. They are waiting the fluid results. She apparently has had that for years. She also has been on Persantine for years. She is very fearful of stroke since her mother had a bad stroke at an earlier age. The patient's insurance company has ceased be willing to pay for it, stating he needed prior approval. She feels like she's done well on it with her circulation in her legs and her head.  Objective: Pleasant lady in no major distress. Chest clear. Heart regular without murmurs. Blood pressure was high when she came in to repeat was 162/70. Her left leg feels warmer to touch than the right. Both have cellulitis from just below the knee all the way down to the ankle. Large cyst medial and above the left knee. It is been there for years and presumably is benign.  Assessment: Stasis dermatitis Cellulitis Cyst left leg Peripheral vascular venous insufficiency Hypertension  Plan: Continue her on the Persantine Go back on Keflex 500 4 times a day return one month, sooner if worse. I told her I am happy to see her regularly though I don't take regular patient's his other appointments. However it will not always be here and she will have see others when I'm not available. Continue current medications

## 2013-04-26 NOTE — Patient Instructions (Addendum)
Take the Persantine one twice daily as you have been for mild blood thinner effect. Because you have tolerated well for many years I see no need to change  Take the cephalexin 500 mg one 4 times daily for cellulitis  Use a very thin amount of the triamcinolone once or twice daily on the rash to prevent itching

## 2013-05-03 ENCOUNTER — Telehealth: Payer: Self-pay | Admitting: Gastroenterology

## 2013-05-03 ENCOUNTER — Telehealth: Payer: Self-pay

## 2013-05-03 MED ORDER — OMEPRAZOLE 20 MG PO CPDR: 20.0000 mg | DELAYED_RELEASE_CAPSULE | Freq: Every day | ORAL | Status: DC

## 2013-05-03 NOTE — Telephone Encounter (Signed)
Medication sent to pharmacy  

## 2013-05-03 NOTE — Telephone Encounter (Signed)
PA approved for dipyridamole 75 through 02/06/2014. Notified pharmacy.Dr Linna Darner cont pt's Rx that pt has been taking for years (pt fears stroke d/t fam hx). Diagnoses used for PA were chronic venous insufficiency (459.81) and stasis dermatitis (454.1) w/note that pt has been stable on med for years and Dr sees no reason to change therapy.

## 2013-05-17 ENCOUNTER — Telehealth: Payer: Self-pay | Admitting: Gastroenterology

## 2013-05-17 ENCOUNTER — Other Ambulatory Visit: Payer: Self-pay | Admitting: *Deleted

## 2013-05-17 DIAGNOSIS — D649 Anemia, unspecified: Secondary | ICD-10-CM

## 2013-05-17 NOTE — Telephone Encounter (Signed)
Pt aware.

## 2013-05-17 NOTE — Telephone Encounter (Signed)
Okay for endoscopy next week

## 2013-05-17 NOTE — Telephone Encounter (Signed)
Just came off Kelflex wants to make sure she is ok for her procedure on 4/14  Was taking for cellulitis.Marland Kitchen  Please advise Dr Deatra Ina

## 2013-05-20 ENCOUNTER — Telehealth: Payer: Self-pay | Admitting: Gastroenterology

## 2013-05-20 NOTE — Telephone Encounter (Signed)
Patient called to cancel 8am procedure She is sick with high blood pressure

## 2013-05-20 NOTE — Telephone Encounter (Signed)
Message copied by Oda Kilts on Mon May 20, 2013  3:33 PM ------      Message from: Webb Laws D      Created: Mon May 20, 2013  2:19 PM       Pt said she wants to talk to you before she makes a decision to cancel tomorrow or not            [phone]  ------

## 2013-05-21 ENCOUNTER — Encounter: Payer: Commercial Managed Care - HMO | Admitting: Gastroenterology

## 2013-05-21 NOTE — Telephone Encounter (Signed)
Need to reschedule this pts procedure

## 2013-05-29 ENCOUNTER — Telehealth: Payer: Self-pay

## 2013-05-29 NOTE — Telephone Encounter (Signed)
Tried to contact patient to see if she is ready to reschedule her EGD.

## 2013-05-29 NOTE — Telephone Encounter (Signed)
Confirmed with pt Dr. Clayborn Heron hours.

## 2013-05-29 NOTE — Telephone Encounter (Signed)
Patient want to leave a message for Dr. Linna Darner. She is coming in on April 27th to see Dr. Linna Darner. Please call patient if Dr. Linna Darner schedule change for that day.

## 2013-06-03 ENCOUNTER — Ambulatory Visit (INDEPENDENT_AMBULATORY_CARE_PROVIDER_SITE_OTHER): Payer: Medicare HMO | Admitting: Family Medicine

## 2013-06-03 VITALS — BP 138/62 | HR 76 | Temp 98.3°F | Resp 16 | Ht 64.0 in | Wt 224.0 lb

## 2013-06-03 DIAGNOSIS — L0390 Cellulitis, unspecified: Secondary | ICD-10-CM

## 2013-06-03 DIAGNOSIS — L0291 Cutaneous abscess, unspecified: Secondary | ICD-10-CM

## 2013-06-03 DIAGNOSIS — I872 Venous insufficiency (chronic) (peripheral): Secondary | ICD-10-CM

## 2013-06-03 DIAGNOSIS — I8310 Varicose veins of unspecified lower extremity with inflammation: Secondary | ICD-10-CM

## 2013-06-03 DIAGNOSIS — D649 Anemia, unspecified: Secondary | ICD-10-CM

## 2013-06-03 LAB — POCT CBC
Granulocyte percent: 76.4 %G (ref 37–80)
HCT, POC: 33.3 % — AB (ref 37.7–47.9)
HEMOGLOBIN: 11.2 g/dL — AB (ref 12.2–16.2)
LYMPH, POC: 1 (ref 0.6–3.4)
MCH: 29.9 pg (ref 27–31.2)
MCHC: 33.6 g/dL (ref 31.8–35.4)
MCV: 89.1 fL (ref 80–97)
MID (CBC): 0.4 (ref 0–0.9)
MPV: 7.7 fL (ref 0–99.8)
PLATELET COUNT, POC: 244 10*3/uL (ref 142–424)
POC GRANULOCYTE: 4.5 (ref 2–6.9)
POC LYMPH PERCENT: 17.5 %L (ref 10–50)
POC MID %: 6.1 % (ref 0–12)
RBC: 3.74 M/uL — AB (ref 4.04–5.48)
RDW, POC: 16.1 %
WBC: 5.9 10*3/uL (ref 4.6–10.2)

## 2013-06-03 LAB — COMPREHENSIVE METABOLIC PANEL
ALK PHOS: 95 U/L (ref 39–117)
ALT: 14 U/L (ref 0–35)
AST: 18 U/L (ref 0–37)
Albumin: 4.5 g/dL (ref 3.5–5.2)
BUN: 26 mg/dL — ABNORMAL HIGH (ref 6–23)
CO2: 25 meq/L (ref 19–32)
CREATININE: 0.84 mg/dL (ref 0.50–1.10)
Calcium: 9.8 mg/dL (ref 8.4–10.5)
Chloride: 98 mEq/L (ref 96–112)
Glucose, Bld: 127 mg/dL — ABNORMAL HIGH (ref 70–99)
Potassium: 5.1 mEq/L (ref 3.5–5.3)
Sodium: 131 mEq/L — ABNORMAL LOW (ref 135–145)
TOTAL PROTEIN: 6.6 g/dL (ref 6.0–8.3)
Total Bilirubin: 0.5 mg/dL (ref 0.2–1.2)

## 2013-06-03 MED ORDER — TRAMADOL HCL 50 MG PO TABS: 50.0000 mg | ORAL_TABLET | Freq: Two times a day (BID) | ORAL | Status: DC | PRN

## 2013-06-03 MED ORDER — MUPIROCIN 2 % EX OINT: 1.0000 "application " | TOPICAL_OINTMENT | Freq: Three times a day (TID) | CUTANEOUS | Status: DC

## 2013-06-03 NOTE — Patient Instructions (Signed)
Take tramadol when needed for severe pain  Tramadol taken with one tylenol makes it work even better  Return in 2 months

## 2013-06-03 NOTE — Progress Notes (Signed)
Subjective: 80 year old lady who is here for recheck. She is very happy today because she says her legs are the best they've been. She's been going exercising 3 days a week. Apparently the sports therapist that she is has helping her is someone who knows me. She has a negative review of systems. A no chest pain or shortness of breath except if she exerts real hard she gets a little winded. She has no GI or GU complaints. The legs are nonswollen, but do swell some as the day goes on. She has not been wearing her stockings all the time, but sometimes does.  Objective: Has one deeper area of excoriation on her right leg and 2 other superficial erythematous areas but the rest of the legs have dry skin without excoriations. She does not have the red cellulitis appears that she has had at times. Chest clear. Heart regular without murmurs. Soft without mass or tenderness. Trace edema.  Assessment: Stasis dermatitis Anemia Leg excoriations, improved  Plan: Commended her on her weight loss Hemoglobin is about the same See her back in 6 or 8 weeks. Same treatment   Results for orders placed in visit on 06/03/13  POCT CBC      Result Value Ref Range   WBC 5.9  4.6 - 10.2 K/uL   Lymph, poc 1.0  0.6 - 3.4   POC LYMPH PERCENT 17.5  10 - 50 %L   MID (cbc) 0.4  0 - 0.9   POC MID % 6.1  0 - 12 %M   POC Granulocyte 4.5  2 - 6.9   Granulocyte percent 76.4  37 - 80 %G   RBC 3.74 (*) 4.04 - 5.48 M/uL   Hemoglobin 11.2 (*) 12.2 - 16.2 g/dL   HCT, POC 33.3 (*) 37.7 - 47.9 %   MCV 89.1  80 - 97 fL   MCH, POC 29.9  27 - 31.2 pg   MCHC 33.6  31.8 - 35.4 g/dL   RDW, POC 16.1     Platelet Count, POC 244  142 - 424 K/uL   MPV 7.7  0 - 99.8 fL

## 2013-06-10 ENCOUNTER — Ambulatory Visit (INDEPENDENT_AMBULATORY_CARE_PROVIDER_SITE_OTHER): Payer: Medicare HMO | Admitting: Family Medicine

## 2013-06-10 VITALS — BP 156/86 | HR 78 | Temp 97.5°F | Resp 16 | Ht 64.0 in | Wt 226.0 lb

## 2013-06-10 DIAGNOSIS — E871 Hypo-osmolality and hyponatremia: Secondary | ICD-10-CM

## 2013-06-10 LAB — BASIC METABOLIC PANEL
BUN: 29 mg/dL — ABNORMAL HIGH (ref 6–23)
CO2: 23 mEq/L (ref 19–32)
Calcium: 9.2 mg/dL (ref 8.4–10.5)
Chloride: 104 mEq/L (ref 96–112)
Creat: 0.84 mg/dL (ref 0.50–1.10)
Glucose, Bld: 104 mg/dL — ABNORMAL HIGH (ref 70–99)
POTASSIUM: 4.5 meq/L (ref 3.5–5.3)
Sodium: 134 mEq/L — ABNORMAL LOW (ref 135–145)

## 2013-06-10 NOTE — Patient Instructions (Signed)
If you do not hear the results of the sodium in the next couple of days please check back with the lab  If the labs looked good I would not persist with eating more salt, to try and mildly decreased fluid intake  Return at any time if problems, otherwise see me in June  Continue to keep the leg elevated.

## 2013-06-10 NOTE — Progress Notes (Signed)
Subjective: I have asked the patient to come back to recheck her labs because her sodium was low. She still feels a little fatigued, but is doing well.  Her legs are doing satisfactorily and the sores are healing up.  Objective: Chest clear. Heart regular without murmurs. 1-2+ pitting edema of the lower ankles, right slightly worse than left. Stasis dermatitis of both lower legs, mild erythema, and the small abrasion on the right leg are almost completely healed.  Assessment: Hyponatremia Stasis dermatitis with abrasions  Plan: Don't scratch her legs. We'll recheck chemistries today and let you know the results of them.

## 2013-06-25 DIAGNOSIS — N3946 Mixed incontinence: Secondary | ICD-10-CM | POA: Insufficient documentation

## 2013-06-30 ENCOUNTER — Ambulatory Visit (INDEPENDENT_AMBULATORY_CARE_PROVIDER_SITE_OTHER): Payer: Medicare HMO | Admitting: Family Medicine

## 2013-06-30 VITALS — BP 128/46 | HR 66 | Temp 98.4°F | Ht 64.0 in | Wt 228.7 lb

## 2013-06-30 DIAGNOSIS — I8310 Varicose veins of unspecified lower extremity with inflammation: Secondary | ICD-10-CM

## 2013-06-30 DIAGNOSIS — L0390 Cellulitis, unspecified: Secondary | ICD-10-CM

## 2013-06-30 DIAGNOSIS — I872 Venous insufficiency (chronic) (peripheral): Secondary | ICD-10-CM

## 2013-06-30 DIAGNOSIS — L0291 Cutaneous abscess, unspecified: Secondary | ICD-10-CM

## 2013-06-30 DIAGNOSIS — L299 Pruritus, unspecified: Secondary | ICD-10-CM

## 2013-06-30 MED ORDER — CEPHALEXIN 500 MG PO CAPS: 500.0000 mg | ORAL_CAPSULE | Freq: Four times a day (QID) | ORAL | Status: DC

## 2013-06-30 MED ORDER — MUPIROCIN 2 % EX OINT: 1.0000 "application " | TOPICAL_OINTMENT | Freq: Three times a day (TID) | CUTANEOUS | Status: DC

## 2013-06-30 NOTE — Patient Instructions (Signed)
Use lubriderm lotion, triamcinolone (or cortizone cream), and the bactroban on your legs twice daily. Do not scratch them. If you notice increased redness, or if they feel hot take the keflex, you were given a prescription for this today. If you get significantly worse, come back in, even if you have already started the keflex. Try to elevate your legs as much as possible.   Venous Stasis or Chronic Venous Insufficiency Chronic venous insufficiency, also called venous stasis, is a condition that affects the veins in the legs. The condition prevents blood from being pumped through these veins effectively. Blood may no longer be pumped effectively from the legs back to the heart. This condition can range from mild to severe. With proper treatment, you should be able to continue with an active life. CAUSES  Chronic venous insufficiency occurs when the vein walls become stretched, weakened, or damaged or when valves within the vein are damaged. Some common causes of this include:  High blood pressure inside the veins (venous hypertension).  Increased blood pressure in the leg veins from long periods of sitting or standing.  A blood clot that blocks blood flow in a vein (deep vein thrombosis).  Inflammation of a superficial vein (phlebitis) that causes a blood clot to form. RISK FACTORS Various things can make you more likely to develop chronic venous insufficiency, including:  Family history of this condition.  Obesity.  Pregnancy.  Sedentary lifestyle.  Smoking.  Jobs requiring long periods of standing or sitting in one place.  Being a certain age. Women in their 45s and 42s and men in their 50s are more likely to develop this condition. SIGNS AND SYMPTOMS  Symptoms may include:   Varicose veins.  Skin breakdown or ulcers.  Reddened or discolored skin on the leg.  Brown, smooth, tight, and painful skin just above the ankle, usually on the inside surface  (lipodermatosclerosis).  Swelling. DIAGNOSIS  To diagnose this condition, your health care provider will take a medical history and do a physical exam. The following tests may be ordered to confirm the diagnosis:  Duplex ultrasound A procedure that produces a picture of a blood vessel and nearby organs and also provides information on blood flow through the blood vessel.  Plethysmography A procedure that tests blood flow.  A venogram, or venography A procedure used to look at the veins using X-ray and dye. TREATMENT The goals of treatment are to help you return to an active life and to minimize pain or disability. Treatment will depend on the severity of the condition. Medical procedures may be needed for severe cases. Treatment options may include:   Use of compression stockings. These can help with symptoms and lower the chances of the problem getting worse, but they do not cure the problem.  Sclerotherapy A procedure involving an injection of a material that "dissolves" the damaged veins. Other veins in the network of blood vessels take over the function of the damaged veins.  Surgery to remove the vein or cut off blood flow through the vein (vein stripping or laser ablation surgery).  Surgery to repair a valve. HOME CARE INSTRUCTIONS   Wear compression stockings as directed by your health care provider.  Only take over-the-counter or prescription medicines for pain, discomfort, or fever as directed by your health care provider.  Follow up with your health care provider as directed. SEEK MEDICAL CARE IF:   You have redness, swelling, or increasing pain in the affected area.  You see a red streak  or line that extends up or down from the affected area.  You have a breakdown or loss of skin in the affected area, even if the breakdown is small.  You have an injury to the affected area. SEEK IMMEDIATE MEDICAL CARE IF:   You have an injury and open wound in the affected  area.  Your pain is severe and does not improve with medicine.  You have sudden numbness or weakness in the foot or ankle below the affected area, or you have trouble moving your foot or ankle.  You have a fever or persistent symptoms for more than 2 3 days.  You have a fever and your symptoms suddenly get worse. MAKE SURE YOU:   Understand these instructions.  Will watch your condition.  Will get help right away if you are not doing well or get worse. Document Released: 05/30/2006 Document Revised: 11/14/2012 Document Reviewed: 10/01/2012 [facility] Patient Information 2014 [place].
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
## 2013-07-14 ENCOUNTER — Emergency Department (HOSPITAL_COMMUNITY)
Admission: EM | Admit: 2013-07-14 | Discharge: 2013-07-14 | Disposition: A | Discharge status: Home / Self Care | Payer: Medicare PPO | Attending: Emergency Medicine | Admitting: Emergency Medicine

## 2013-07-14 ENCOUNTER — Encounter (HOSPITAL_COMMUNITY): Payer: Self-pay | Admitting: Emergency Medicine

## 2013-07-14 DIAGNOSIS — Z8669 Personal history of other diseases of the nervous system and sense organs: Secondary | ICD-10-CM | POA: Insufficient documentation

## 2013-07-14 DIAGNOSIS — R4689 Other symptoms and signs involving appearance and behavior: Secondary | ICD-10-CM

## 2013-07-14 DIAGNOSIS — F1510 Other stimulant abuse, uncomplicated: Secondary | ICD-10-CM | POA: Insufficient documentation

## 2013-07-14 DIAGNOSIS — F919 Conduct disorder, unspecified: Secondary | ICD-10-CM | POA: Insufficient documentation

## 2013-07-14 DIAGNOSIS — M129 Arthropathy, unspecified: Secondary | ICD-10-CM | POA: Insufficient documentation

## 2013-07-14 DIAGNOSIS — IMO0002 Reserved for concepts with insufficient information to code with codable children: Secondary | ICD-10-CM | POA: Insufficient documentation

## 2013-07-14 DIAGNOSIS — F411 Generalized anxiety disorder: Secondary | ICD-10-CM | POA: Insufficient documentation

## 2013-07-14 DIAGNOSIS — L539 Erythematous condition, unspecified: Secondary | ICD-10-CM | POA: Insufficient documentation

## 2013-07-14 DIAGNOSIS — Z87828 Personal history of other (healed) physical injury and trauma: Secondary | ICD-10-CM | POA: Insufficient documentation

## 2013-07-14 DIAGNOSIS — E785 Hyperlipidemia, unspecified: Secondary | ICD-10-CM | POA: Insufficient documentation

## 2013-07-14 DIAGNOSIS — Z79899 Other long term (current) drug therapy: Secondary | ICD-10-CM | POA: Insufficient documentation

## 2013-07-14 DIAGNOSIS — M25559 Pain in unspecified hip: Secondary | ICD-10-CM | POA: Insufficient documentation

## 2013-07-14 DIAGNOSIS — R51 Headache: Secondary | ICD-10-CM | POA: Insufficient documentation

## 2013-07-14 DIAGNOSIS — Z792 Long term (current) use of antibiotics: Secondary | ICD-10-CM | POA: Insufficient documentation

## 2013-07-14 DIAGNOSIS — R609 Edema, unspecified: Secondary | ICD-10-CM | POA: Insufficient documentation

## 2013-07-14 DIAGNOSIS — F1110 Opioid abuse, uncomplicated: Secondary | ICD-10-CM | POA: Insufficient documentation

## 2013-07-14 DIAGNOSIS — D649 Anemia, unspecified: Secondary | ICD-10-CM | POA: Insufficient documentation

## 2013-07-14 LAB — RAPID URINE DRUG SCREEN, HOSP PERFORMED
AMPHETAMINES: POSITIVE — AB
Barbiturates: NOT DETECTED
Benzodiazepines: NOT DETECTED
Cocaine: NOT DETECTED
Opiates: POSITIVE — AB
TETRAHYDROCANNABINOL: NOT DETECTED

## 2013-07-14 LAB — PROTIME-INR
INR: 0.98 (ref 0.00–1.49)
Prothrombin Time: 12.8 s (ref 11.6–15.2)

## 2013-07-14 LAB — URINALYSIS, ROUTINE W REFLEX MICROSCOPIC
Bilirubin Urine: NEGATIVE
Glucose, UA: NEGATIVE mg/dL
Hgb urine dipstick: NEGATIVE
Ketones, ur: NEGATIVE mg/dL
Leukocytes, UA: NEGATIVE
Nitrite: NEGATIVE
Protein, ur: NEGATIVE mg/dL
Specific Gravity, Urine: 1.014 (ref 1.005–1.030)
Urobilinogen, UA: 0.2 mg/dL (ref 0.0–1.0)
pH: 5.5 (ref 5.0–8.0)

## 2013-07-14 LAB — CBC WITH DIFFERENTIAL/PLATELET
Basophils Absolute: 0 K/uL (ref 0.0–0.1)
Basophils Relative: 0 % (ref 0–1)
Eosinophils Absolute: 0.2 K/uL (ref 0.0–0.7)
Eosinophils Relative: 4 % (ref 0–5)
HCT: 31.7 % — ABNORMAL LOW (ref 36.0–46.0)
Hemoglobin: 10.7 g/dL — ABNORMAL LOW (ref 12.0–15.0)
Lymphocytes Relative: 18 % (ref 12–46)
Lymphs Abs: 1 K/uL (ref 0.7–4.0)
MCH: 29.8 pg (ref 26.0–34.0)
MCHC: 33.8 g/dL (ref 30.0–36.0)
MCV: 88.3 fL (ref 78.0–100.0)
Monocytes Absolute: 0.7 K/uL (ref 0.1–1.0)
Monocytes Relative: 12 % (ref 3–12)
Neutro Abs: 3.6 K/uL (ref 1.7–7.7)
Neutrophils Relative %: 66 % (ref 43–77)
Platelets: 181 K/uL (ref 150–400)
RBC: 3.59 MIL/uL — ABNORMAL LOW (ref 3.87–5.11)
RDW: 14.7 % (ref 11.5–15.5)
WBC: 5.5 K/uL (ref 4.0–10.5)

## 2013-07-14 LAB — COMPREHENSIVE METABOLIC PANEL
ALK PHOS: 124 U/L — AB (ref 39–117)
ALT: 11 U/L (ref 0–35)
AST: 18 U/L (ref 0–37)
Albumin: 3.9 g/dL (ref 3.5–5.2)
BILIRUBIN TOTAL: 0.5 mg/dL (ref 0.3–1.2)
BUN: 26 mg/dL — AB (ref 6–23)
CHLORIDE: 99 meq/L (ref 96–112)
CO2: 23 meq/L (ref 19–32)
Calcium: 9.7 mg/dL (ref 8.4–10.5)
Creatinine, Ser: 0.93 mg/dL (ref 0.50–1.10)
GFR, EST AFRICAN AMERICAN: 66 mL/min — AB (ref >90)
GFR, EST NON AFRICAN AMERICAN: 57 mL/min — AB (ref >90)
GLUCOSE: 104 mg/dL — AB (ref 70–99)
POTASSIUM: 4.3 meq/L (ref 3.7–5.3)
SODIUM: 135 meq/L — AB (ref 137–147)
Total Protein: 6.8 g/dL (ref 6.0–8.3)

## 2013-07-14 LAB — AMMONIA: Ammonia: 20 umol/L (ref 11–60)

## 2013-07-14 LAB — ETHANOL: Alcohol, Ethyl (B): <11 mg/dL (ref 0–11)

## 2013-07-14 MED ORDER — SODIUM CHLORIDE 0.9 % IV SOLN: 1000.0000 mL | INTRAVENOUS | Status: DC

## 2013-07-14 MED ORDER — ALPRAZOLAM 0.25 MG PO TABS: 0.2500 mg | ORAL_TABLET | Freq: Three times a day (TID) | ORAL | Status: DC | PRN

## 2013-07-14 NOTE — ED Notes (Signed)
Per family, pt has been altered.  Pt has been more "hyper" than normal.  Pt very talkative on assessment.  Pt is alert and oriented but family reports some confusion noted over past few days.  Pt recently started detrol for urinary frequency/urgency.
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
## 2013-07-22 ENCOUNTER — Telehealth: Payer: Self-pay | Admitting: Gastroenterology

## 2013-07-22 NOTE — Telephone Encounter (Signed)
Patient cancelled a procedure needs to reschedule an EGD  Have to see if its here or hospital

## 2013-07-23 NOTE — Telephone Encounter (Signed)
PATIENT NEEDS TO RESCHEDULE EGD WITH DR Deatra Ina BUT WANTS TO SEE STACY BLYTH FIRST AND CALL BACK TO SCHEDULE.

## 2013-07-24 ENCOUNTER — Encounter: Payer: Self-pay | Admitting: Family Medicine

## 2013-07-24 ENCOUNTER — Ambulatory Visit (INDEPENDENT_AMBULATORY_CARE_PROVIDER_SITE_OTHER): Payer: Commercial Managed Care - HMO | Admitting: Family Medicine

## 2013-07-24 VITALS — BP 124/62 | HR 72 | Temp 98.0°F | Ht 64.0 in | Wt 223.0 lb

## 2013-07-24 DIAGNOSIS — I10 Essential (primary) hypertension: Secondary | ICD-10-CM

## 2013-07-24 DIAGNOSIS — I779 Disorder of arteries and arterioles, unspecified: Secondary | ICD-10-CM

## 2013-07-24 DIAGNOSIS — R109 Unspecified abdominal pain: Secondary | ICD-10-CM

## 2013-07-24 DIAGNOSIS — I739 Peripheral vascular disease, unspecified: Secondary | ICD-10-CM

## 2013-07-24 DIAGNOSIS — R600 Localized edema: Secondary | ICD-10-CM

## 2013-07-24 DIAGNOSIS — K219 Gastro-esophageal reflux disease without esophagitis: Secondary | ICD-10-CM | POA: Insufficient documentation

## 2013-07-24 DIAGNOSIS — M129 Arthropathy, unspecified: Secondary | ICD-10-CM

## 2013-07-24 DIAGNOSIS — E785 Hyperlipidemia, unspecified: Secondary | ICD-10-CM

## 2013-07-24 DIAGNOSIS — I8310 Varicose veins of unspecified lower extremity with inflammation: Secondary | ICD-10-CM

## 2013-07-24 DIAGNOSIS — E669 Obesity, unspecified: Secondary | ICD-10-CM

## 2013-07-24 DIAGNOSIS — M1990 Unspecified osteoarthritis, unspecified site: Secondary | ICD-10-CM | POA: Insufficient documentation

## 2013-07-24 DIAGNOSIS — R609 Edema, unspecified: Secondary | ICD-10-CM

## 2013-07-24 DIAGNOSIS — I872 Venous insufficiency (chronic) (peripheral): Secondary | ICD-10-CM

## 2013-07-24 DIAGNOSIS — F341 Dysthymic disorder: Secondary | ICD-10-CM

## 2013-07-24 DIAGNOSIS — E871 Hypo-osmolality and hyponatremia: Secondary | ICD-10-CM | POA: Insufficient documentation

## 2013-07-24 DIAGNOSIS — F418 Other specified anxiety disorders: Secondary | ICD-10-CM | POA: Insufficient documentation

## 2013-07-24 DIAGNOSIS — D649 Anemia, unspecified: Secondary | ICD-10-CM

## 2013-07-24 DIAGNOSIS — K449 Diaphragmatic hernia without obstruction or gangrene: Secondary | ICD-10-CM

## 2013-07-24 HISTORY — DX: Obesity, unspecified: E66.9

## 2013-07-24 HISTORY — DX: Essential (primary) hypertension: I10

## 2013-07-24 HISTORY — DX: Gastro-esophageal reflux disease without esophagitis: K21.9

## 2013-07-24 HISTORY — DX: Localized edema: R60.0

## 2013-07-24 HISTORY — DX: Disorder of arteries and arterioles, unspecified: I77.9

## 2013-07-24 LAB — CBC
HEMATOCRIT: 30.3 % — AB (ref 36.0–46.0)
Hemoglobin: 10.5 g/dL — ABNORMAL LOW (ref 12.0–15.0)
MCH: 29.9 pg (ref 26.0–34.0)
MCHC: 34.7 g/dL (ref 30.0–36.0)
MCV: 86.3 fL (ref 78.0–100.0)
Platelets: 189 10*3/uL (ref 150–400)
RBC: 3.51 MIL/uL — ABNORMAL LOW (ref 3.87–5.11)
RDW: 15.6 % — ABNORMAL HIGH (ref 11.5–15.5)
WBC: 4.5 10*3/uL (ref 4.0–10.5)
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
## 2013-07-24 NOTE — Patient Instructions (Signed)
Preventive Care for Adults A healthy lifestyle and preventive care can promote health and wellness. Preventive health guidelines for women include the following key practices.  A routine yearly physical is a good way to check with your health care provider about your health and preventive screening. It is a chance to share any concerns and updates on your health and to receive a thorough exam.  Visit your dentist for a routine exam and preventive care every 6 months. Brush your teeth twice a day and floss once a day. Good oral hygiene prevents tooth decay and gum disease.  The frequency of eye exams is based on your age, health, family medical history, use of contact lenses, and other factors. Follow your health care provider's recommendations for frequency of eye exams.  Eat a healthy diet. Foods like vegetables, fruits, whole grains, low-fat dairy products, and lean protein foods contain the nutrients you need without too many calories. Decrease your intake of foods high in solid fats, added sugars, and salt. Eat the right amount of calories for you.Get information about a proper diet from your health care provider, if necessary.  Regular physical exercise is one of the most important things you can do for your health. Most adults should get at least 150 minutes of moderate-intensity exercise (any activity that increases your heart rate and causes you to sweat) each week. In addition, most adults need muscle-strengthening exercises on 2 or more days a week.  Maintain a healthy weight. The body mass index (BMI) is a screening tool to identify possible weight problems. It provides an estimate of body fat based on height and weight. Your health care provider can find your BMI, and can help you achieve or maintain a healthy weight.For adults 20 years and older:  A BMI below 18.5 is considered underweight.  A BMI of 18.5 to 24.9 is normal.  A BMI of 25 to 29.9 is considered overweight.  A BMI of  30 and above is considered obese.  Maintain normal blood lipids and cholesterol levels by exercising and minimizing your intake of saturated fat. Eat a balanced diet with plenty of fruit and vegetables. Blood tests for lipids and cholesterol should begin at age 52 and be repeated every 5 years. If your lipid or cholesterol levels are high, you are over 50, or you are at high risk for heart disease, you may need your cholesterol levels checked more frequently.Ongoing high lipid and cholesterol levels should be treated with medicines if diet and exercise are not working.  If you smoke, find out from your health care provider how to quit. If you do not use tobacco, do not start.  Lung cancer screening is recommended for adults aged 37-80 years who are at high risk for developing lung cancer because of a history of smoking. A yearly low-dose CT scan of the lungs is recommended for people who have at least a 30-pack-year history of smoking and are a current smoker or have quit within the past 15 years. A pack year of smoking is smoking an average of 1 pack of cigarettes a day for 1 year (for example: 1 pack a day for 30 years or 2 packs a day for 15 years). Yearly screening should continue until the smoker has stopped smoking for at least 15 years. Yearly screening should be stopped for people who develop a health problem that would prevent them from having lung cancer treatment.  If you are pregnant, do not drink alcohol. If you are breastfeeding,  be very cautious about drinking alcohol. If you are not pregnant and choose to drink alcohol, do not have more than 1 drink per day. One drink is considered to be 12 ounces (355 mL) of beer, 5 ounces (148 mL) of wine, or 1.5 ounces (44 mL) of liquor.  Avoid use of street drugs. Do not share needles with anyone. Ask for help if you need support or instructions about stopping the use of drugs.  High blood pressure causes heart disease and increases the risk of  stroke. Your blood pressure should be checked at least every 1 to 2 years. Ongoing high blood pressure should be treated with medicines if weight loss and exercise do not work.  If you are 3-86 years old, ask your health care provider if you should take aspirin to prevent strokes.  Diabetes screening involves taking a blood sample to check your fasting blood sugar level. This should be done once every 3 years, after age 67, if you are within normal weight and without risk factors for diabetes. Testing should be considered at a younger age or be carried out more frequently if you are overweight and have at least 1 risk factor for diabetes.  Breast cancer screening is essential preventive care for women. You should practice "breast self-awareness." This means understanding the normal appearance and feel of your breasts and may include breast self-examination. Any changes detected, no matter how small, should be reported to a health care provider. Women in their 8s and 30s should have a clinical breast exam (CBE) by a health care provider as part of a regular health exam every 1 to 3 years. After age 70, women should have a CBE every year. Starting at age 25, women should consider having a mammogram (breast X-ray test) every year. Women who have a family history of breast cancer should talk to their health care provider about genetic screening. Women at a high risk of breast cancer should talk to their health care providers about having an MRI and a mammogram every year.  Breast cancer gene (BRCA)-related cancer risk assessment is recommended for women who have family members with BRCA-related cancers. BRCA-related cancers include breast, ovarian, tubal, and peritoneal cancers. Having family members with these cancers may be associated with an increased risk for harmful changes (mutations) in the breast cancer genes BRCA1 and BRCA2. Results of the assessment will determine the need for genetic counseling and  BRCA1 and BRCA2 testing.  Routine pelvic exams to screen for cancer are no longer recommended for nonpregnant women who are considered low risk for cancer of the pelvic organs (ovaries, uterus, and vagina) and who do not have symptoms. Ask your health care provider if a screening pelvic exam is right for you.  If you have had past treatment for cervical cancer or a condition that could lead to cancer, you need Pap tests and screening for cancer for at least 20 years after your treatment. If Pap tests have been discontinued, your risk factors (such as having a new sexual partner) need to be reassessed to determine if screening should be resumed. Some women have medical problems that increase the chance of getting cervical cancer. In these cases, your health care provider may recommend more frequent screening and Pap tests.  The HPV test is an additional test that may be used for cervical cancer screening. The HPV test looks for the virus that can cause the cell changes on the cervix. The cells collected during the Pap test can be  tested for HPV. The HPV test could be used to screen women aged 47 years and older, and should be used in women of any age who have unclear Pap test results. After the age of 36, women should have HPV testing at the same frequency as a Pap test.  Colorectal cancer can be detected and often prevented. Most routine colorectal cancer screening begins at the age of 38 years and continues through age 58 years. However, your health care provider may recommend screening at an earlier age if you have risk factors for colon cancer. On a yearly basis, your health care provider may provide home test kits to check for hidden blood in the stool. Use of a small camera at the end of a tube, to directly examine the colon (sigmoidoscopy or colonoscopy), can detect the earliest forms of colorectal cancer. Talk to your health care provider about this at age 64, when routine screening begins. Direct  exam of the colon should be repeated every 5-10 years through age 21 years, unless early forms of pre-cancerous polyps or small growths are found.  People who are at an increased risk for hepatitis B should be screened for this virus. You are considered at high risk for hepatitis B if:  You were born in a country where hepatitis B occurs often. Talk with your health care provider about which countries are considered high risk.  Your parents were born in a high-risk country and you have not received a shot to protect against hepatitis B (hepatitis B vaccine).  You have HIV or AIDS.  You use needles to inject street drugs.  You live with, or have sex with, someone who has Hepatitis B.  You get hemodialysis treatment.  You take certain medicines for conditions like cancer, organ transplantation, and autoimmune conditions.  Hepatitis C blood testing is recommended for all people born from [birth year] through 1965 and any individual with known risks for hepatitis C.  Practice safe sex. Use condoms and avoid high-risk sexual practices to reduce the spread of sexually transmitted infections (STIs). STIs include gonorrhea, chlamydia, syphilis, trichomonas, herpes, HPV, and human immunodeficiency virus (HIV). Herpes, HIV, and HPV are viral illnesses that have no cure. They can result in disability, cancer, and death.  You should be screened for sexually transmitted illnesses (STIs) including gonorrhea and chlamydia if:  You are sexually active and are younger than 24 years.  You are older than 24 years and your health care provider tells you that you are at risk for this type of infection.  Your sexual activity has changed since you were last screened and you are at an increased risk for chlamydia or gonorrhea. Ask your health care provider if you are at risk.  If you are at risk of being infected with HIV, it is recommended that you take a prescription medicine daily to prevent HIV infection. This is  called preexposure prophylaxis (PrEP). You are considered at risk if:  You are a heterosexual woman, are sexually active, and are at increased risk for HIV infection.  You take drugs by injection.  You are sexually active with a partner who has HIV.  Talk with your health care provider about whether you are at high risk of being infected with HIV. If you choose to begin PrEP, you should first be tested for HIV. You should then be tested every 3 months for as long as you are taking PrEP.  Osteoporosis is a disease in which the bones lose minerals and strength  with aging. This can result in serious bone fractures or breaks. The risk of osteoporosis can be identified using a bone density scan. Women ages 65 years and over and women at risk for fractures or osteoporosis should discuss screening with their health care providers. Ask your health care provider whether you should take a calcium supplement or vitamin D to reduce the rate of osteoporosis.  Menopause can be associated with physical symptoms and risks. Hormone replacement therapy is available to decrease symptoms and risks. You should talk to your health care provider about whether hormone replacement therapy is right for you.  Use sunscreen. Apply sunscreen liberally and repeatedly throughout the day. You should seek shade when your shadow is shorter than you. Protect yourself by wearing long sleeves, pants, a wide-brimmed hat, and sunglasses year round, whenever you are outdoors.  Once a month, do a whole body skin exam, using a mirror to look at the skin on your back. Tell your health care provider of new moles, moles that have irregular borders, moles that are larger than a pencil eraser, or moles that have changed in shape or color.  Stay current with required vaccines (immunizations).  Influenza vaccine. All adults should be immunized every year.  Tetanus, diphtheria, and acellular pertussis (Td, Tdap) vaccine. Pregnant women should  receive 1 dose of Tdap vaccine during each pregnancy. The dose should be obtained regardless of the length of time since the last dose. Immunization is preferred during the 27th-36th week of gestation. An adult who has not previously received Tdap or who does not know her vaccine status should receive 1 dose of Tdap. This initial dose should be followed by tetanus and diphtheria toxoids (Td) booster doses every 10 years. Adults with an unknown or incomplete history of completing a 3-dose immunization series with Td-containing vaccines should begin or complete a primary immunization series including a Tdap dose. Adults should receive a Td booster every 10 years.  Varicella vaccine. An adult without evidence of immunity to varicella should receive 2 doses or a second dose if she has previously received 1 dose. Pregnant females who do not have evidence of immunity should receive the first dose after pregnancy. This first dose should be obtained before leaving the health care facility. The second dose should be obtained 4-8 weeks after the first dose.  Human papillomavirus (HPV) vaccine. Females aged 13-26 years who have not received the vaccine previously should obtain the 3-dose series. The vaccine is not recommended for use in pregnant females. However, pregnancy testing is not needed before receiving a dose. If a female is found to be pregnant after receiving a dose, no treatment is needed. In that case, the remaining doses should be delayed until after the pregnancy. Immunization is recommended for any person with an immunocompromised condition through the age of 26 years if she did not get any or all doses earlier. During the 3-dose series, the second dose should be obtained 4-8 weeks after the first dose. The third dose should be obtained 24 weeks after the first dose and 16 weeks after the second dose.  Zoster vaccine. One dose is recommended for adults aged 60 years or older unless certain conditions are  present.  Measles, mumps, and rubella (MMR) vaccine. Adults born before 1957 generally are considered immune to measles and mumps. Adults born in 1957 or later should have 1 or more doses of MMR vaccine unless there is a contraindication to the vaccine or there is laboratory evidence of immunity to   each of the three diseases. A routine second dose of MMR vaccine should be obtained at least 28 days after the first dose for students attending postsecondary schools, health care workers, or international travelers. People who received inactivated measles vaccine or an unknown type of measles vaccine during 1963-1967 should receive 2 doses of MMR vaccine. People who received inactivated mumps vaccine or an unknown type of mumps vaccine before 1979 and are at high risk for mumps infection should consider immunization with 2 doses of MMR vaccine. For females of childbearing age, rubella immunity should be determined. If there is no evidence of immunity, females who are not pregnant should be vaccinated. If there is no evidence of immunity, females who are pregnant should delay immunization until after pregnancy. Unvaccinated health care workers born before 1957 who lack laboratory evidence of measles, mumps, or rubella immunity or laboratory confirmation of disease should consider measles and mumps immunization with 2 doses of MMR vaccine or rubella immunization with 1 dose of MMR vaccine.  Pneumococcal 13-valent conjugate (PCV13) vaccine. When indicated, a person who is uncertain of her immunization history and has no record of immunization should receive the PCV13 vaccine. An adult aged 19 years or older who has certain medical conditions and has not been previously immunized should receive 1 dose of PCV13 vaccine. This PCV13 should be followed with a dose of pneumococcal polysaccharide (PPSV23) vaccine. The PPSV23 vaccine dose should be obtained at least 8 weeks after the dose of PCV13 vaccine. An adult aged 19  years or older who has certain medical conditions and previously received 1 or more doses of PPSV23 vaccine should receive 1 dose of PCV13. The PCV13 vaccine dose should be obtained 1 or more years after the last PPSV23 vaccine dose.  Pneumococcal polysaccharide (PPSV23) vaccine. When PCV13 is also indicated, PCV13 should be obtained first. All adults aged 65 years and older should be immunized. An adult younger than age 65 years who has certain medical conditions should be immunized. Any person who resides in a nursing home or long-term care facility should be immunized. An adult smoker should be immunized. People with an immunocompromised condition and certain other conditions should receive both PCV13 and PPSV23 vaccines. People with human immunodeficiency virus (HIV) infection should be immunized as soon as possible after diagnosis. Immunization during chemotherapy or radiation therapy should be avoided. Routine use of PPSV23 vaccine is not recommended for American Indians, [place] Natives, or people younger than 65 years unless there are medical conditions that require PPSV23 vaccine. When indicated, people who have unknown immunization and have no record of immunization should receive PPSV23 vaccine. One-time revaccination 5 years after the first dose of PPSV23 is recommended for people aged 19-64 years who have chronic kidney failure, nephrotic syndrome, asplenia, or immunocompromised conditions. People who received 1-2 doses of PPSV23 before age 65 years should receive another dose of PPSV23 vaccine at age 65 years or later if at least 5 years have passed since the previous dose. Doses of PPSV23 are not needed for people immunized with PPSV23 at or after age 65 years.  Meningococcal vaccine. Adults with asplenia or persistent complement component deficiencies should receive 2 doses of quadrivalent meningococcal conjugate (MenACWY-D) vaccine. The doses should be obtained at least 2 months apart.  Microbiologists working with certain meningococcal bacteria, military recruits, people at risk during an outbreak, and people who travel to or live in countries with a high rate of meningitis should be immunized. A first-year college student up through age   21 years who is living in a residence hall should receive a dose if she did not receive a dose on or after her 16th birthday. Adults who have certain high-risk conditions should receive one or more doses of vaccine.  Hepatitis A vaccine. Adults who wish to be protected from this disease, have certain high-risk conditions, work with hepatitis A-infected animals, work in hepatitis A research labs, or travel to or work in countries with a high rate of hepatitis A should be immunized. Adults who were previously unvaccinated and who anticipate close contact with an international adoptee during the first 60 days after arrival in the [place] from a country with a high rate of hepatitis A should be immunized.  Hepatitis B vaccine. Adults who wish to be protected from this disease, have certain high-risk conditions, may be exposed to blood or other infectious body fluids, are household contacts or sex partners of hepatitis B positive people, are clients or workers in certain care facilities, or travel to or work in countries with a high rate of hepatitis B should be immunized.  Haemophilus influenzae type b (Hib) vaccine. A previously unvaccinated person with asplenia or sickle cell disease or having a scheduled splenectomy should receive 1 dose of Hib vaccine. Regardless of previous immunization, a recipient of a hematopoietic stem cell transplant should receive a 3-dose series 6-12 months after her successful transplant. Hib vaccine is not recommended for adults with HIV infection. Preventive Services / Frequency Ages 43 to 39years  Blood pressure check.** / Every 1 to 2 years.  Lipid and cholesterol check.** / Every 5 years beginning at age  75.  Clinical breast exam.** / Every 3 years for women in their 32s and 74s.  BRCA-related cancer risk assessment.** / For women who have family members with a BRCA-related cancer (breast, ovarian, tubal, or peritoneal cancers).  Pap test.** / Every 2 years from ages 65 through 91. Every 3 years starting at age 34 through age 93 or 72 with a history of 3 consecutive normal Pap tests.  HPV screening.** / Every 3 years from ages 46 through ages 53 to 26 with a history of 3 consecutive normal Pap tests.  Hepatitis C blood test.** / For any individual with known risks for hepatitis C.  Skin self-exam. / Monthly.  Influenza vaccine. / Every year.  Tetanus, diphtheria, and acellular pertussis (Tdap, Td) vaccine.** / Consult your health care provider. Pregnant women should receive 1 dose of Tdap vaccine during each pregnancy. 1 dose of Td every 10 years.  Varicella vaccine.** / Consult your health care provider. Pregnant females who do not have evidence of immunity should receive the first dose after pregnancy.  HPV vaccine. / 3 doses over 6 months, if 70 and younger. The vaccine is not recommended for use in pregnant females. However, pregnancy testing is not needed before receiving a dose.  Measles, mumps, rubella (MMR) vaccine.** / You need at least 1 dose of MMR if you were born in 1957 or later. You may also need a 2nd dose. For females of childbearing age, rubella immunity should be determined. If there is no evidence of immunity, females who are not pregnant should be vaccinated. If there is no evidence of immunity, females who are pregnant should delay immunization until after pregnancy.  Pneumococcal 13-valent conjugate (PCV13) vaccine.** / Consult your health care provider.  Pneumococcal polysaccharide (PPSV23) vaccine.** / 1 to 2 doses if you smoke cigarettes or if you have certain conditions.  Meningococcal vaccine.** /  1 dose if you are age 70 to 51 years and a [place] living in a residence hall, or have one of several medical conditions, you need to get vaccinated against meningococcal disease. You may also need additional booster doses.  Hepatitis A vaccine.** / Consult your health care provider.  Hepatitis B vaccine.** / Consult your health care provider.  Haemophilus influenzae type b (Hib) vaccine.** / Consult your health care provider. Ages 40 to 64years  Blood pressure check.** / Every 1 to 2 years.  Lipid and cholesterol check.** / Every 5 years beginning at age 58 years.  Lung cancer screening. / Every year if you are aged 56-80 years and have a 30-pack-year history of smoking and currently smoke or have quit within the past 15 years. Yearly screening is stopped once you have quit smoking for at least 15 years or develop a health problem that would prevent you from having lung cancer treatment.  Clinical breast exam.** / Every year after age 35 years.  BRCA-related cancer risk assessment.** / For women who have family members with a BRCA-related cancer (breast, ovarian, tubal, or peritoneal cancers).  Mammogram.** / Every year beginning at age 109 years and continuing for as long as you are in good health. Consult with your health care provider.  Pap test.** / Every 3 years starting at age 44 years through age 94 or 70 years with a history of 3 consecutive normal Pap tests.  HPV screening.** / Every 3 years from ages 109 years through ages 50 to 30 years with a history of 3 consecutive normal Pap tests.  Fecal occult blood test (FOBT) of stool. / Every year beginning at age 73 years and continuing until age 59 years. You may not need to do this test if you get a colonoscopy every 10 years.  Flexible sigmoidoscopy or colonoscopy.** / Every 5 years for a flexible sigmoidoscopy or every 10 years for a colonoscopy beginning at age 68 years and continuing until age 12 years.  Hepatitis C blood test.** / For all people born from [birth year] through  1965 and any individual with known risks for hepatitis C.  Skin self-exam. / Monthly.  Influenza vaccine. / Every year.  Tetanus, diphtheria, and acellular pertussis (Tdap/Td) vaccine.** / Consult your health care provider. Pregnant women should receive 1 dose of Tdap vaccine during each pregnancy. 1 dose of Td every 10 years.  Varicella vaccine.** / Consult your health care provider. Pregnant females who do not have evidence of immunity should receive the first dose after pregnancy.  Zoster vaccine.** / 1 dose for adults aged 2 years or older.  Measles, mumps, rubella (MMR) vaccine.** / You need at least 1 dose of MMR if you were born in 1957 or later. You may also need a 2nd dose. For females of childbearing age, rubella immunity should be determined. If there is no evidence of immunity, females who are not pregnant should be vaccinated. If there is no evidence of immunity, females who are pregnant should delay immunization until after pregnancy.  Pneumococcal 13-valent conjugate (PCV13) vaccine.** / Consult your health care provider.  Pneumococcal polysaccharide (PPSV23) vaccine.** / 1 to 2 doses if you smoke cigarettes or if you have certain conditions.  Meningococcal vaccine.** / Consult your health care provider.  Hepatitis A vaccine.** / Consult your health care provider.  Hepatitis B vaccine.** / Consult your health care provider.  Haemophilus influenzae type b (Hib) vaccine.** / Consult your health care provider. Ages 48 years  and over  Blood pressure check.** / Every 1 to 2 years.  Lipid and cholesterol check.** / Every 5 years beginning at age 84 years.  Lung cancer screening. / Every year if you are aged 50-80 years and have a 30-pack-year history of smoking and currently smoke or have quit within the past 15 years. Yearly screening is stopped once you have quit smoking for at least 15 years or develop a health problem that would prevent you from having lung cancer  treatment.  Clinical breast exam.** / Every year after age 24 years.  BRCA-related cancer risk assessment.** / For women who have family members with a BRCA-related cancer (breast, ovarian, tubal, or peritoneal cancers).  Mammogram.** / Every year beginning at age 14 years and continuing for as long as you are in good health. Consult with your health care provider.  Pap test.** / Every 3 years starting at age 17 years through age 31 or 74 years with 3 consecutive normal Pap tests. Testing can be stopped between 65 and 70 years with 3 consecutive normal Pap tests and no abnormal Pap or HPV tests in the past 10 years.  HPV screening.** / Every 3 years from ages 30 years through ages 70 or 28 years with a history of 3 consecutive normal Pap tests. Testing can be stopped between 65 and 70 years with 3 consecutive normal Pap tests and no abnormal Pap or HPV tests in the past 10 years.  Fecal occult blood test (FOBT) of stool. / Every year beginning at age 64 years and continuing until age 92 years. You may not need to do this test if you get a colonoscopy every 10 years.  Flexible sigmoidoscopy or colonoscopy.** / Every 5 years for a flexible sigmoidoscopy or every 10 years for a colonoscopy beginning at age 73 years and continuing until age 39 years.  Hepatitis C blood test.** / For all people born from [birth year] through 1963 and any individual with known risks for hepatitis C.  Osteoporosis screening.** / A one-time screening for women ages 35 years and over and women at risk for fractures or osteoporosis.  Skin self-exam. / Monthly.  Influenza vaccine. / Every year.  Tetanus, diphtheria, and acellular pertussis (Tdap/Td) vaccine.** / 1 dose of Td every 10 years.  Varicella vaccine.** / Consult your health care provider.  Zoster vaccine.** / 1 dose for adults aged 59 years or older.  Pneumococcal 13-valent conjugate (PCV13) vaccine.** / Consult your health care provider.  Pneumococcal  polysaccharide (PPSV23) vaccine.** / 1 dose for all adults aged 8 years and older.  Meningococcal vaccine.** / Consult your health care provider.  Hepatitis A vaccine.** / Consult your health care provider.  Hepatitis B vaccine.** / Consult your health care provider.  Haemophilus influenzae type b (Hib) vaccine.** / Consult your health care provider. ** Family history and personal history of risk and conditions may change your health care provider's recommendations. Document Released: 03/22/2001 Document Revised: 01/29/2013 Document Reviewed: 06/21/2010 [facility] Patient Information 2015 [place], [place]. This information is not intended to replace advice given to you by your health care provider. Make sure you discuss any questions you have with your health care provider.

## 2013-07-24 NOTE — Assessment & Plan Note (Signed)
Well controlled, no changes to meds. Encouraged heart healthy diet such as the DASH diet and exercise as tolerated.  °

## 2013-07-24 NOTE — Assessment & Plan Note (Signed)
Has used small amounts of Diazepam or Alprazolam prn in the past

## 2013-07-24 NOTE — Progress Notes (Signed)
Pre visit review using our clinic review tool, if applicable. No additional management support is needed unless otherwise documented below in the visit note. 

## 2013-07-24 NOTE — Assessment & Plan Note (Signed)
Encouraged DASH diet, decrease po intake and increase exercise as tolerated. Needs 7-8 hours of sleep nightly. Avoid trans fats, eat small, frequent meals every 4-5 hours with lean proteins, complex carbs and healthy fats. Minimize simple carbs, GMO foods. 

## 2013-07-24 NOTE — Assessment & Plan Note (Deleted)
Uses

## 2013-07-24 NOTE — Assessment & Plan Note (Signed)
Increase leafy greens, consider increased lean red meat and using cast iron cookware. Continue to monitor, report any concerns 

## 2013-07-24 NOTE — Assessment & Plan Note (Signed)
Pain is worst in right hip, no longer a surgical candidate

## 2013-07-25 ENCOUNTER — Other Ambulatory Visit: Payer: Self-pay | Admitting: Family Medicine

## 2013-07-25 LAB — HEPATIC FUNCTION PANEL
ALT: 12 U/L (ref 0–35)
AST: 21 U/L (ref 0–37)
Albumin: 4.4 g/dL (ref 3.5–5.2)
Alkaline Phosphatase: 116 U/L (ref 39–117)
BILIRUBIN TOTAL: 0.7 mg/dL (ref 0.2–1.2)
Bilirubin, Direct: 0.1 mg/dL (ref 0.0–0.3)
Indirect Bilirubin: 0.6 mg/dL (ref 0.2–1.2)
TOTAL PROTEIN: 6.3 g/dL (ref 6.0–8.3)

## 2013-07-25 LAB — LIPID PANEL
CHOLESTEROL: 163 mg/dL (ref 0–200)
HDL: 59 mg/dL (ref >39)
LDL Cholesterol: 90 mg/dL (ref 0–99)
Total CHOL/HDL Ratio: 2.8 Ratio
Triglycerides: 69 mg/dL (ref <150)
VLDL: 14 mg/dL (ref 0–40)

## 2013-07-25 LAB — RENAL FUNCTION PANEL
Albumin: 4.4 g/dL (ref 3.5–5.2)
BUN: 24 mg/dL — AB (ref 6–23)
CALCIUM: 9.4 mg/dL (ref 8.4–10.5)
CO2: 22 mEq/L (ref 19–32)
Chloride: 105 mEq/L (ref 96–112)
Creat: 0.98 mg/dL (ref 0.50–1.10)
Glucose, Bld: 91 mg/dL (ref 70–99)
Phosphorus: 3.3 mg/dL (ref 2.3–4.6)
Potassium: 4.1 mEq/L (ref 3.5–5.3)
Sodium: 137 mEq/L (ref 135–145)

## 2013-07-25 LAB — URINALYSIS

## 2013-07-25 LAB — TSH: TSH: 0.982 u[IU]/mL (ref 0.350–4.500)

## 2013-07-26 LAB — URINALYSIS
Bilirubin Urine: NEGATIVE
GLUCOSE, UA: NEGATIVE mg/dL
Hgb urine dipstick: NEGATIVE
Ketones, ur: NEGATIVE mg/dL
Leukocytes, UA: NEGATIVE
NITRITE: NEGATIVE
PH: 5 (ref 5.0–8.0)
Protein, ur: 30 mg/dL — AB
SPECIFIC GRAVITY, URINE: 1.024 (ref 1.005–1.030)
Urobilinogen, UA: 0.2 mg/dL (ref 0.0–1.0)

## 2013-07-27 LAB — URINE CULTURE: Colony Count: 50000

## 2013-07-29 ENCOUNTER — Ambulatory Visit (INDEPENDENT_AMBULATORY_CARE_PROVIDER_SITE_OTHER): Payer: Medicare HMO | Admitting: Family Medicine

## 2013-07-29 VITALS — BP 154/78 | HR 76 | Temp 98.2°F | Resp 16 | Wt 227.0 lb

## 2013-07-29 DIAGNOSIS — I10 Essential (primary) hypertension: Secondary | ICD-10-CM

## 2013-07-29 DIAGNOSIS — R609 Edema, unspecified: Secondary | ICD-10-CM

## 2013-07-29 DIAGNOSIS — L02419 Cutaneous abscess of limb, unspecified: Secondary | ICD-10-CM

## 2013-07-29 DIAGNOSIS — L03119 Cellulitis of unspecified part of limb: Secondary | ICD-10-CM

## 2013-07-29 DIAGNOSIS — E669 Obesity, unspecified: Secondary | ICD-10-CM

## 2013-07-29 DIAGNOSIS — R600 Localized edema: Secondary | ICD-10-CM

## 2013-07-29 DIAGNOSIS — I872 Venous insufficiency (chronic) (peripheral): Secondary | ICD-10-CM

## 2013-07-29 DIAGNOSIS — I8310 Varicose veins of unspecified lower extremity with inflammation: Secondary | ICD-10-CM

## 2013-07-29 MED ORDER — MUPIROCIN 2 % EX OINT: 1.0000 "application " | TOPICAL_OINTMENT | Freq: Three times a day (TID) | CUTANEOUS | Status: DC

## 2013-07-29 NOTE — Patient Instructions (Signed)
Current treatments  Try wearing the support stocking at nighttime  Continue with the good exercises  Return in about 6 weeks

## 2013-07-29 NOTE — Progress Notes (Signed)
Subjective

## 2013-08-02 ENCOUNTER — Encounter: Payer: Self-pay | Admitting: Family Medicine

## 2013-08-02 NOTE — Assessment & Plan Note (Signed)
Long standing history of recurrent infection, will treat as needed

## 2013-08-02 NOTE — Assessment & Plan Note (Signed)
Avoid offending foods, start probiotics. Do not eat large meals in late evening and consider raising head of bed.  

## 2013-08-02 NOTE — Assessment & Plan Note (Addendum)
Mild via imaging per patient, will monitor

## 2013-08-02 NOTE — Assessment & Plan Note (Signed)
Tolerating statin, encouraged heart healthy diet, avoid trans fats, minimize simple carbs and saturated fats. Increase exercise as tolerated 

## 2013-08-05 ENCOUNTER — Telehealth: Payer: Self-pay | Admitting: *Deleted

## 2013-08-05 NOTE — Telephone Encounter (Signed)
There is a note open where Chelsea Browning lmovm to return call. Closing this one

## 2013-08-05 NOTE — Telephone Encounter (Signed)
Office Message [address] [place], [place] [address] p. [phone] f. [phone] To: Jarome Lamas (After Hours Triage) Fax: [phone] From: [facility] Date/ Time: 08/03/2013 4:09 PM Taken By: Fransisca Connors, CSR Caller: [place]: home Patient: Chelsea Browning, Chelsea Browning DOB: 1933-01-13 Phone: [phone] Reason for Call: Pt is calling to request her lab results. Regarding Appointment: Appt Date: Appt Time: Unknown Ousmane Seeman: Reason: Details: Outcome:

## 2013-08-13 DIAGNOSIS — M169 Osteoarthritis of hip, unspecified: Secondary | ICD-10-CM | POA: Insufficient documentation

## 2013-08-22 ENCOUNTER — Ambulatory Visit (INDEPENDENT_AMBULATORY_CARE_PROVIDER_SITE_OTHER): Payer: Medicare HMO | Admitting: Family Medicine

## 2013-08-22 VITALS — BP 138/74 | HR 73 | Temp 97.3°F | Resp 16 | Ht 64.0 in | Wt 228.0 lb

## 2013-08-22 DIAGNOSIS — R600 Localized edema: Secondary | ICD-10-CM

## 2013-08-22 DIAGNOSIS — G479 Sleep disorder, unspecified: Secondary | ICD-10-CM

## 2013-08-22 DIAGNOSIS — L02419 Cutaneous abscess of limb, unspecified: Secondary | ICD-10-CM

## 2013-08-22 DIAGNOSIS — R609 Edema, unspecified: Secondary | ICD-10-CM

## 2013-08-22 DIAGNOSIS — L03119 Cellulitis of unspecified part of limb: Secondary | ICD-10-CM

## 2013-08-22 MED ORDER — CEPHALEXIN 500 MG PO CAPS: 500.0000 mg | ORAL_CAPSULE | Freq: Four times a day (QID) | ORAL | Status: DC

## 2013-08-22 MED ORDER — FUROSEMIDE 20 MG PO TABS: 20.0000 mg | ORAL_TABLET | ORAL | Status: DC

## 2013-08-22 NOTE — Progress Notes (Signed)
Subjective: Patient is here for recheck of her legs. She thinks the cellulitis is getting more red and inflamed. Her legs are swelling more. She denies scratching them.  Objective: She has 2-3+ edema of the legs from just below the knee down. There is increased erythema, more on the left than the right. She has a lot of very superficial excoriations, with some linear areas. I assured her that that means that something scratch them. She has been wearing her toes. She has a lot of erythema. On the anterior upper shin on both sides have some more areas that she is keeping dressed with Telfa. They're weeping some. The deeper ulcerations are all healed up at this time.  Assessment: Cellulitis and stasis dermatitis of legs with excoriations Sleep disturbance  Plan: Keflex Try taking a Benadryl at bedtime to see if that helps her sleep for Sleep disturbance Lasix 20 mg daily for the fluid Return in 2 or 3 weeks for a recheck

## 2013-08-22 NOTE — Patient Instructions (Addendum)
Continue to refrain from scratching.  Trying the Benadryl at nighttime might work well for you. It also would help you not scratching inadvertently.  Keflex 4 times daily  Furosemide 20 mg one each morning  Recheck in 2-3 weeks, sooner if necessary

## 2013-08-29 ENCOUNTER — Ambulatory Visit (INDEPENDENT_AMBULATORY_CARE_PROVIDER_SITE_OTHER): Payer: Medicare HMO | Admitting: Family Medicine

## 2013-08-29 ENCOUNTER — Telehealth: Payer: Self-pay

## 2013-08-29 VITALS — BP 174/58 | HR 76 | Temp 97.8°F | Resp 16 | Ht 64.0 in | Wt 227.0 lb

## 2013-08-29 DIAGNOSIS — R600 Localized edema: Secondary | ICD-10-CM

## 2013-08-29 DIAGNOSIS — R609 Edema, unspecified: Secondary | ICD-10-CM

## 2013-08-29 DIAGNOSIS — I8310 Varicose veins of unspecified lower extremity with inflammation: Secondary | ICD-10-CM

## 2013-08-29 DIAGNOSIS — I872 Venous insufficiency (chronic) (peripheral): Secondary | ICD-10-CM

## 2013-08-29 NOTE — Telephone Encounter (Signed)
Called pt and advised she needs to RTC for re-eval. Pt agreed and will come in to see Dr Linna Darner when he gets here this afternoon. Dr Linna Darner, Juluis Rainier

## 2013-08-29 NOTE — Telephone Encounter (Signed)
Pt states she was recently evaluated by dr hopper and is taking medications he prescribed as directed, but she is concerned because "the skin below her knee is weeping" And she has had to take off the compression stocking because they are wet. Please would like to speak with someone as soon as possible to advise what to do

## 2013-08-29 NOTE — Patient Instructions (Signed)
Take 2 Lasix daily for 4 days, then drop back to one daily  Discontinue if lightheaded  Continue trying to elevate the leg when possible.  When just sitting around at home try and let air get to that weeping area for the skin to have an opportunity to dry a little  Return in one week

## 2013-08-29 NOTE — Progress Notes (Signed)
Subjective: Patient is concerned because her right leg was weeping fluid. She has been taking the Lasix. The cellulitis looks better.  Objective: Much less erythema than a week ago. Still has chronic stasis dermatitis and edema. The excoriated areas below her right knee along the shin have been weeping fluid.  Assessment: Stasis dermatitis and edema, with weeping of fluid from leg  Plan: Increase Lasix to 40 mg daily for about 4 days, then drop back to 20 mg daily. Continue elevation. Try and stay off of them better. See her back in one week for recheck.

## 2013-08-30 NOTE — Telephone Encounter (Signed)
Was seen Thursday evening

## 2013-08-31 ENCOUNTER — Telehealth: Payer: Self-pay

## 2013-08-31 NOTE — Telephone Encounter (Signed)
Pt being treated for cellulitis by Dr Linna Darner - but has also seen Dr Carlota Raspberry. Pt states she is on Keflex and her cellulitis has gone from red to pink and is feeling a bit better. She calls mainly for concern on her stools. She states her stools are 'blacker than black' and is concerned about why. She states she is not having any pain associated with her movements.   I advised the pt she is welcome to see [place] if needed until the message is returned.   Bf

## 2013-09-01 NOTE — Telephone Encounter (Signed)
Left message to return call 

## 2013-09-02 ENCOUNTER — Telehealth: Payer: Self-pay

## 2013-09-02 NOTE — Telephone Encounter (Signed)
Pt called stating the Cephalexin that Dr. Linna Darner has put her on is causing wet black stools.   Pt is also eating meds with yogurt.   Pt is very concerned. Pt # [phone]

## 2013-09-02 NOTE — Telephone Encounter (Signed)
Black stools Saturday only. Stool color has returned to normal. She states has been eating a great deal of blueberries. i advised if stools become black again, please return to office to check for blood in stool. She has an appointment on thursday.  Please advise

## 2013-09-04 ENCOUNTER — Ambulatory Visit (INDEPENDENT_AMBULATORY_CARE_PROVIDER_SITE_OTHER): Payer: Medicare HMO | Admitting: Family Medicine

## 2013-09-04 VITALS — BP 130/80 | HR 73 | Temp 98.3°F | Resp 17 | Ht 64.0 in | Wt 224.0 lb

## 2013-09-04 DIAGNOSIS — R609 Edema, unspecified: Secondary | ICD-10-CM

## 2013-09-04 DIAGNOSIS — I872 Venous insufficiency (chronic) (peripheral): Secondary | ICD-10-CM

## 2013-09-04 DIAGNOSIS — I8310 Varicose veins of unspecified lower extremity with inflammation: Secondary | ICD-10-CM

## 2013-09-04 DIAGNOSIS — R600 Localized edema: Secondary | ICD-10-CM

## 2013-09-04 DIAGNOSIS — R252 Cramp and spasm: Secondary | ICD-10-CM

## 2013-09-04 LAB — BASIC METABOLIC PANEL
BUN: 41 mg/dL — AB (ref 6–23)
CO2: 23 mEq/L (ref 19–32)
Calcium: 9.7 mg/dL (ref 8.4–10.5)
Chloride: 101 mEq/L (ref 96–112)
Creat: 1.15 mg/dL — ABNORMAL HIGH (ref 0.50–1.10)
Glucose, Bld: 105 mg/dL — ABNORMAL HIGH (ref 70–99)
POTASSIUM: 4.4 meq/L (ref 3.5–5.3)
Sodium: 136 mEq/L (ref 135–145)

## 2013-09-04 LAB — MAGNESIUM: MAGNESIUM: 2.1 mg/dL (ref 1.5–2.5)

## 2013-09-04 MED ORDER — FUROSEMIDE 20 MG PO TABS: 20.0000 mg | ORAL_TABLET | ORAL | Status: DC

## 2013-09-04 NOTE — Patient Instructions (Addendum)
Take the Lasix 20 mg most days, 40 mg when more swelling, but you do not need to take in the if you are having a good stretch where you're not swelling badly.  Wear the stockings when you're going to be up and about.  Continue to try to keep the legs elevated as possible  Return in one month, sooner if problems

## 2013-09-04 NOTE — Progress Notes (Signed)
Subjective: Patient uses some cream on her legs every day. They continue to weep. She has not been wearing the stockings since I gave the legs a little rest from them. However she continues to weep clear fluid from her legs. She does not have as much inflammation. She is finishing up her cephalexin. She's been having some leg cramps in her thighs. She started to take an OTC medication that has some magnesium in it.  Objective: Pleasant as always. 3+ edema of the legs. Some erythema still of the stasis dermatitis, not as bad as some time. The excoriations are much improved. No deep wounds. Weeping some serous fluid. No thigh tenderness where she cramps.  Assessment: Edema Stasis dermatitis Legs weeping  Plan: Elevate Stockings No antibiotics Lasix to be taken on a when necessary basis depending on the amount of swelling Check labs Return in one month

## 2013-09-05 ENCOUNTER — Telehealth: Payer: Self-pay

## 2013-09-05 DIAGNOSIS — R600 Localized edema: Secondary | ICD-10-CM

## 2013-09-05 MED ORDER — FUROSEMIDE 20 MG PO TABS: 20.0000 mg | ORAL_TABLET | Freq: Two times a day (BID) | ORAL | Status: DC

## 2013-09-05 NOTE — Telephone Encounter (Signed)
Pharm faxed for clarification of Rx for Lasix. Rx reads "take 1 tablet by mouth 2 times a week", but for quantity of #60. Read Dr Clayborn Heron OV notes which instr' pt to take 1 tab most mornings and a 2nd one later in day if needed. I have sent in new Rx for twice daily

## 2013-09-10 ENCOUNTER — Other Ambulatory Visit: Payer: Self-pay | Admitting: *Deleted

## 2013-09-10 ENCOUNTER — Telehealth: Payer: Self-pay

## 2013-09-10 DIAGNOSIS — R748 Abnormal levels of other serum enzymes: Secondary | ICD-10-CM

## 2013-09-10 NOTE — Telephone Encounter (Signed)
Please call this patient. Pt left another message on my voice mail needing a gynecological exam

## 2013-09-10 NOTE — Telephone Encounter (Signed)
Left message with husband for her to return my call. Dr. Charlett Blake has a cancellation for 09/12/13. I'm holding this slot for this patient.

## 2013-09-10 NOTE — Telephone Encounter (Signed)
Patient left a message stating that she needs to schedule an appt (I believe it said for a gyn?)  Please call and schedule this with patient

## 2013-09-11 ENCOUNTER — Telehealth: Payer: Self-pay | Admitting: Family Medicine

## 2013-09-11 NOTE — Telephone Encounter (Signed)
Patient states that she called [place] insurance and states that Dr. Charlett Blake is not covered for "doing pap smears". Patient states that she still wants to come in for the appointment tomorrow but wants to know how much the visit would be? Patient did say that she is willing to pay out of pocket

## 2013-09-12 ENCOUNTER — Ambulatory Visit: Payer: Commercial Managed Care - HMO | Admitting: Family Medicine

## 2013-09-12 NOTE — Telephone Encounter (Signed)
Chelsea Browning cancelled this appointment because she isn't sure how much she would have to pay out of pocket. Please do not charge patient no show fee

## 2013-09-19 ENCOUNTER — Telehealth: Payer: Self-pay

## 2013-09-19 NOTE — Telephone Encounter (Signed)
Pt called in and states she will be coming tomorrow for her lab work. There is an order in the computer. Thank you

## 2013-09-20 ENCOUNTER — Other Ambulatory Visit (INDEPENDENT_AMBULATORY_CARE_PROVIDER_SITE_OTHER): Payer: Medicare HMO | Admitting: Radiology

## 2013-09-20 VITALS — BP 172/62

## 2013-09-20 DIAGNOSIS — R748 Abnormal levels of other serum enzymes: Secondary | ICD-10-CM

## 2013-09-21 LAB — BASIC METABOLIC PANEL
BUN: 23 mg/dL (ref 6–23)
CALCIUM: 8.9 mg/dL (ref 8.4–10.5)
CO2: 23 meq/L (ref 19–32)
Chloride: 104 mEq/L (ref 96–112)
Creat: 0.94 mg/dL (ref 0.50–1.10)
Glucose, Bld: 97 mg/dL (ref 70–99)
POTASSIUM: 4.5 meq/L (ref 3.5–5.3)
SODIUM: 139 meq/L (ref 135–145)

## 2013-09-23 ENCOUNTER — Ambulatory Visit (INDEPENDENT_AMBULATORY_CARE_PROVIDER_SITE_OTHER): Payer: Medicare HMO | Admitting: Family Medicine

## 2013-09-23 ENCOUNTER — Telehealth: Payer: Self-pay

## 2013-09-23 VITALS — BP 174/62 | HR 69 | Temp 97.7°F | Resp 18 | Ht 65.0 in | Wt 225.0 lb

## 2013-09-23 DIAGNOSIS — I872 Venous insufficiency (chronic) (peripheral): Secondary | ICD-10-CM

## 2013-09-23 DIAGNOSIS — I8310 Varicose veins of unspecified lower extremity with inflammation: Secondary | ICD-10-CM

## 2013-09-23 DIAGNOSIS — L02419 Cutaneous abscess of limb, unspecified: Secondary | ICD-10-CM

## 2013-09-23 DIAGNOSIS — L03119 Cellulitis of unspecified part of limb: Secondary | ICD-10-CM

## 2013-09-23 MED ORDER — CEPHALEXIN 500 MG PO CAPS: 500.0000 mg | ORAL_CAPSULE | Freq: Four times a day (QID) | ORAL | Status: DC
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
## 2013-09-23 NOTE — Telephone Encounter (Signed)
Pt stated that " she wanted to talk to [facility] about something gynecologically but its not urgent." LDM

## 2013-09-23 NOTE — Patient Instructions (Signed)
1. Elevate legs as much as possible. 2.  Avoid exercise until follow-up.   3.  Avoid prolonged walking or prolonged standing.

## 2013-09-24 ENCOUNTER — Encounter: Payer: Self-pay | Admitting: Family Medicine

## 2013-09-24 ENCOUNTER — Telehealth: Payer: Self-pay | Admitting: *Deleted

## 2013-09-24 NOTE — Telephone Encounter (Signed)
Spoke to pt, she is aware this is not a problem with our patients seeing whom they wish.

## 2013-09-24 NOTE — Telephone Encounter (Signed)
Patient called back to make sure she would not be upsetting Dr. Tamala Julian by seeing Dr. Linna Darner.  She wants Dr. Tamala Julian to know she was wonderful, smart and a great doctor.    She will be coming in tomorrow to see Dr. Linna Darner.  He knows her well and she really likes his expert care.

## 2013-09-24 NOTE — Telephone Encounter (Signed)
Chelsea Browning came in on 09/23/2013 to see Dr. Linna Darner, but saw Dr. Tamala Julian instead.   Pt leg was weeping and Dr. Tamala Julian but a boot on both legs, then informed pt to come back on Thursday at 5:00 to have a recheck.  Pt wanted to see if Dr. Linna Darner could look at her leg on Thursday morning or could she come in sooner for him to look at the legs, instead without hurting Dr. Tamala Julian.    Pt wants someone to call her back asap - [phone].     Emmit Alexanders and Thank you,

## 2013-09-24 NOTE — Telephone Encounter (Signed)
I returned patients call and she states that she was told by Colletta Maryland that she would call her back and let her know if [facility] covered her pap smear? Pt states that she called Colletta Maryland a couple times and was told the last time that manager was in a meeting but still has not heard anything back?  I informed pt that I would send this to [place] and I would have [place] call her back with this information. Pt states she would still like to have a pap even if her insurance doesn't cover but would still like to know?  Please advise and inform patient at your earliest convenience. Thanks

## 2013-09-25 ENCOUNTER — Ambulatory Visit (INDEPENDENT_AMBULATORY_CARE_PROVIDER_SITE_OTHER): Payer: Medicare HMO | Admitting: Family Medicine

## 2013-09-25 VITALS — BP 154/84 | HR 74 | Temp 98.0°F | Resp 17 | Ht 64.0 in | Wt 227.0 lb

## 2013-09-25 DIAGNOSIS — L98491 Non-pressure chronic ulcer of skin of other sites limited to breakdown of skin: Secondary | ICD-10-CM

## 2013-09-25 DIAGNOSIS — I8310 Varicose veins of unspecified lower extremity with inflammation: Secondary | ICD-10-CM

## 2013-09-25 DIAGNOSIS — I872 Venous insufficiency (chronic) (peripheral): Secondary | ICD-10-CM

## 2013-09-25 DIAGNOSIS — L98499 Non-pressure chronic ulcer of skin of other sites with unspecified severity: Secondary | ICD-10-CM

## 2013-09-25 DIAGNOSIS — R609 Edema, unspecified: Secondary | ICD-10-CM

## 2013-09-25 NOTE — Patient Instructions (Signed)
Return Monday for recheck 4PM  Continue to elevate foot as much as possible.

## 2013-09-25 NOTE — Progress Notes (Signed)
Subjective:  80 year old lady who has been having problems with the stasis and cellulitis and ulcerations on her leg. She was in here 3 days ago and an in a boot was applied. Part of it have come loose and she is tightening it up with tape. She's feeling okay. She didn't strike elevated as much as possible. Is concerned because she was told she had a little murmur.  Objective: Lungs clear to auscultation. Grade 1/6 systolic murmur, left upper sternal border Cellulitis both legs with stasis dermatitis. The redness is not as bad as it is at times. It is also less swollen than it has been sometimes. She does have some excoriations and cellulitis and superficial ulcerations, more on the right leg.  The wound was undressed, examined, and redressed with food. She tolerated this well.  Assessment: Stasis dermatitis and cellulitis and skin ulcerations Chronic edema  Plan: Continue current meds. Stay off foot as possible. Return Monday

## 2013-09-27 LAB — WOUND CULTURE
Gram Stain: NONE SEEN
Gram Stain: NONE SEEN
Gram Stain: NONE SEEN
ORGANISM ID, BACTERIA: NO GROWTH

## 2013-09-30 ENCOUNTER — Ambulatory Visit (INDEPENDENT_AMBULATORY_CARE_PROVIDER_SITE_OTHER): Payer: Medicare HMO | Admitting: Family Medicine

## 2013-09-30 VITALS — HR 72 | Temp 97.8°F | Resp 18 | Ht 64.0 in | Wt 229.6 lb

## 2013-09-30 DIAGNOSIS — R609 Edema, unspecified: Secondary | ICD-10-CM

## 2013-09-30 DIAGNOSIS — I872 Venous insufficiency (chronic) (peripheral): Secondary | ICD-10-CM

## 2013-09-30 DIAGNOSIS — R600 Localized edema: Secondary | ICD-10-CM

## 2013-09-30 DIAGNOSIS — I8310 Varicose veins of unspecified lower extremity with inflammation: Secondary | ICD-10-CM

## 2013-09-30 NOTE — Progress Notes (Signed)
Subjective: Patient is here for a recheck of her stasis dermatitis. We put the unna boot back on a few days ago. She feels like it is doing better.  Objective: Swelling has decreased. The erythema is much much better. The areas where there were chronic superficial ulcerations or mostly healed. In a boot came off 2 areas and a tiny bit of bleeding.  Assessment: Stasis dermatitis with chronic edema and superficial ulcerations, much improved  Plan: Can resume a little more active lifestyle. Should still keep her legs elevated as much of the time if possible. Keep the wound area dressed. Return in about 10 days for recheck.

## 2013-09-30 NOTE — Patient Instructions (Signed)
Gently wash the legs.  If exercises make it seem worse please stop doing them.  Try to keep elevating as much is possible.  Keep a light dressing on the knees open sore areas  Where your support hose when you are up.  Return if worse at anytime, otherwise recheck in about 10 days.

## 2013-10-10 ENCOUNTER — Ambulatory Visit (INDEPENDENT_AMBULATORY_CARE_PROVIDER_SITE_OTHER): Payer: Medicare HMO | Admitting: Family Medicine

## 2013-10-10 VITALS — BP 142/72 | HR 64 | Temp 97.9°F | Resp 16 | Ht 64.0 in | Wt 228.0 lb

## 2013-10-10 DIAGNOSIS — I872 Venous insufficiency (chronic) (peripheral): Secondary | ICD-10-CM

## 2013-10-10 DIAGNOSIS — E669 Obesity, unspecified: Secondary | ICD-10-CM

## 2013-10-10 DIAGNOSIS — L02419 Cutaneous abscess of limb, unspecified: Secondary | ICD-10-CM

## 2013-10-10 DIAGNOSIS — R600 Localized edema: Secondary | ICD-10-CM

## 2013-10-10 DIAGNOSIS — L03119 Cellulitis of unspecified part of limb: Secondary | ICD-10-CM

## 2013-10-10 DIAGNOSIS — L03115 Cellulitis of right lower limb: Secondary | ICD-10-CM

## 2013-10-10 DIAGNOSIS — R609 Edema, unspecified: Secondary | ICD-10-CM

## 2013-10-10 DIAGNOSIS — I8310 Varicose veins of unspecified lower extremity with inflammation: Secondary | ICD-10-CM

## 2013-10-10 NOTE — Patient Instructions (Addendum)
Come in anytime between 8-4 on Saturday 10/26/2013.  Dress with dry dressing

## 2013-10-10 NOTE — Progress Notes (Signed)
Subjective: 80 year old lady who is back for her recheck on the stasis dermatitis of her legs. Her right one is doing a little worse than the left. She is one superficial ulceration on the right upper anterior thigh that weeps constantly. No pain. She has been trying to stay off the leg most of the time, though not always. Usually wears compression stockings rolled.  Objective: Bilateral stasis dermatitis worse on right than left. Chronic erythematous cellulitis appearance, not as bad as at times. No obvious infection. Has one area that weeps.  Assessment: Stasis dermatitis and cellulitis Chronic edema Obesity  Plan: Dry dressing it with a dry dressing and we'll see if it dries up continue other treatment. Prescribe new stockings. Return in 2 weeks.

## 2013-11-01 ENCOUNTER — Ambulatory Visit (INDEPENDENT_AMBULATORY_CARE_PROVIDER_SITE_OTHER): Payer: Medicare HMO | Admitting: Family Medicine

## 2013-11-01 VITALS — BP 170/64 | HR 64 | Temp 97.9°F | Resp 20 | Ht 64.0 in | Wt 224.0 lb

## 2013-11-01 DIAGNOSIS — L02419 Cutaneous abscess of limb, unspecified: Secondary | ICD-10-CM

## 2013-11-01 DIAGNOSIS — R609 Edema, unspecified: Secondary | ICD-10-CM

## 2013-11-01 DIAGNOSIS — R600 Localized edema: Secondary | ICD-10-CM

## 2013-11-01 DIAGNOSIS — I8310 Varicose veins of unspecified lower extremity with inflammation: Secondary | ICD-10-CM

## 2013-11-01 DIAGNOSIS — E669 Obesity, unspecified: Secondary | ICD-10-CM

## 2013-11-01 DIAGNOSIS — I872 Venous insufficiency (chronic) (peripheral): Secondary | ICD-10-CM

## 2013-11-01 DIAGNOSIS — L97921 Non-pressure chronic ulcer of unspecified part of left lower leg limited to breakdown of skin: Secondary | ICD-10-CM

## 2013-11-01 DIAGNOSIS — L97909 Non-pressure chronic ulcer of unspecified part of unspecified lower leg with unspecified severity: Secondary | ICD-10-CM

## 2013-11-01 DIAGNOSIS — L03119 Cellulitis of unspecified part of limb: Secondary | ICD-10-CM

## 2013-11-01 MED ORDER — CEPHALEXIN 500 MG PO CAPS: 500.0000 mg | ORAL_CAPSULE | Freq: Four times a day (QID) | ORAL | Status: DC

## 2013-11-01 NOTE — Patient Instructions (Addendum)
Take the cephalexin 1 pill 4 times daily.  Return in 6 days for recheck  Continue to try and avoid excessive salt and keep legs elevated as often as possible.  Referral is being made to the vein clinic on [address] Dr. Aleda Grana

## 2013-11-01 NOTE — Progress Notes (Signed)
Subjective: 80 year old lady well-known to me. She is here with no problems with her left leg. Right leg is doing well but the left gotten red with multiple areas of skin breakdown. She has been trying to watch her diet and keep her legs elevated. She's not been having chest pains or shortness of breath. She is preparing to travel in a couple of weeks up to [place]. No GI or GU complaints.  Objective: Pleasant lady alert and oriented. Extremities have 1-2+ pitting edema bilaterally. He right is only mildly erythematous, hasdermatitis still. The left testis dermatitis with multiple areas of superficial ulceration. Several places were draining pus. Culture was taken. In a boot applied.  Assessment:  Stasis dermatitis Edema Probable venous insufficiency  Plan: Talk to her about referral to a pain specialist. She is going to do that. If she can to have a specialist yesterday with her mind. Will make referral. Placed on Keflex. Return next week

## 2013-11-03 LAB — WOUND CULTURE
GRAM STAIN: NONE SEEN
Gram Stain: NONE SEEN

## 2013-11-07 ENCOUNTER — Ambulatory Visit (INDEPENDENT_AMBULATORY_CARE_PROVIDER_SITE_OTHER): Payer: Medicare HMO | Admitting: Family Medicine

## 2013-11-07 VITALS — BP 136/68 | HR 69 | Temp 98.3°F | Resp 16 | Ht 64.0 in | Wt 225.0 lb

## 2013-11-07 DIAGNOSIS — I8312 Varicose veins of left lower extremity with inflammation: Secondary | ICD-10-CM

## 2013-11-07 DIAGNOSIS — I872 Venous insufficiency (chronic) (peripheral): Secondary | ICD-10-CM

## 2013-11-07 DIAGNOSIS — I8311 Varicose veins of right lower extremity with inflammation: Secondary | ICD-10-CM

## 2013-11-07 NOTE — Progress Notes (Signed)
Subjective: Patient is back for recheck of that leg. It is doing much better  Objective:  Stasis dermatitis symptomatically better. unna boot was removed. One little tear in the skin and the boot was removed. Otherwise only is covered with dry flaking skin. Much less swelling. It looks the best it has for some time. Medium dressing applied with Ace wrap on top..  Assessment: Stasis dermatitis and ulcerations, much improved with cellulitis resolved  Plan: Return for a recheck next week or more time. Instructions given on caring for it.

## 2013-11-07 NOTE — Patient Instructions (Addendum)
Soaks leg and then wash with soap and water  Return next Thursday for a recheck  Contact Dr. Doreene Burke office and find out if they need anything else to complete a referral in to give you an appointment. If he referral has not gone through please contact our office and speak to the referrals desk.Address: [address], [place], [place] [address]  [phone]  Wear ACE wrap to try to help reduce swelling

## 2014-01-06 ENCOUNTER — Telehealth: Payer: Self-pay

## 2014-01-06 NOTE — Telephone Encounter (Signed)
Robert with [employer] states they received a referral from [facility] about a lift chair, however that referral would have to come from pt's PCP which is DR. HOPPER Please call [phone]

## 2014-01-07 ENCOUNTER — Telehealth: Payer: Self-pay

## 2014-01-07 NOTE — Telephone Encounter (Signed)
Dr. Linna Darner - patient wanted me to let you know that she is coming in on 12/02 to see you and needs help with a referral.

## 2014-01-08 ENCOUNTER — Telehealth: Payer: Self-pay

## 2014-01-08 DIAGNOSIS — I890 Lymphedema, not elsewhere classified: Secondary | ICD-10-CM

## 2014-01-08 DIAGNOSIS — I872 Venous insufficiency (chronic) (peripheral): Secondary | ICD-10-CM

## 2014-01-15 ENCOUNTER — Encounter: Payer: Self-pay | Admitting: Family Medicine

## 2014-01-15 ENCOUNTER — Ambulatory Visit (INDEPENDENT_AMBULATORY_CARE_PROVIDER_SITE_OTHER): Payer: Medicare HMO | Admitting: Family Medicine

## 2014-01-15 VITALS — BP 134/60 | HR 69 | Temp 98.1°F | Resp 16 | Ht 64.0 in | Wt 228.0 lb

## 2014-01-15 DIAGNOSIS — L03119 Cellulitis of unspecified part of limb: Secondary | ICD-10-CM

## 2014-01-15 DIAGNOSIS — I8312 Varicose veins of left lower extremity with inflammation: Secondary | ICD-10-CM

## 2014-01-15 DIAGNOSIS — I8311 Varicose veins of right lower extremity with inflammation: Secondary | ICD-10-CM

## 2014-01-15 DIAGNOSIS — I872 Venous insufficiency (chronic) (peripheral): Secondary | ICD-10-CM

## 2014-01-15 MED ORDER — CEPHALEXIN 500 MG PO CAPS: 500.0000 mg | ORAL_CAPSULE | Freq: Four times a day (QID) | ORAL | Status: DC

## 2014-01-15 NOTE — Progress Notes (Signed)
Subjective: Patient has been seeing Dr. Aleda Grana for the pain issue in her legs since I last saw her. She had a lot of testing done and there was nothing he could do definitively to solve the problem. He treated her with Unna boots and she has been doing much better. However the antibiotic came off a week or 2 ago and her left leg has swollen back up and become excoriated again. She says she fell on it. She denies scratching it. She started herself back on her Keflex that she had left over last night.  She also has been about her lipid whether she should be taking red rice. She wondered whether her lipid meds were any good. She tolerated been over a year since her bladder been tested. I went back and reviewed her records with her. The last lipid studies in the summer looked excellent. We will check it again seen.  Objective: Right leg looks much better. Only 3 or 4 tiny excoriations. Minimal edema. No longer weeping like it was last time. The left leg is more swollen. Erythema in the high stocking area. Multiple excoriations. A couple of places that she had hit when she fell. There is some pus draining.  Assessment: Cellulitis Stasis dermatitis Hyperlipidemia  Plan: Keflex 500 4 times a day Let see how it looks in 2 days before proceeding with putting an Unna boot on it. She is to return in 2 days. Recheck her lipids on return.

## 2014-01-15 NOTE — Patient Instructions (Signed)
Take the Keflex 4 times daily  Return in 2 days for Unna boot  Try and keep the leg elevated as possible.  You can wear the compression hose when you are going to be up and about

## 2014-01-16 ENCOUNTER — Telehealth: Payer: Self-pay | Admitting: *Deleted

## 2014-01-16 NOTE — Telephone Encounter (Signed)
Per pt received flu shot at [facility], [place].

## 2014-01-17 ENCOUNTER — Ambulatory Visit (INDEPENDENT_AMBULATORY_CARE_PROVIDER_SITE_OTHER): Payer: Medicare HMO | Admitting: Family Medicine

## 2014-01-17 VITALS — BP 150/70 | HR 65 | Temp 98.3°F | Resp 16 | Wt 227.0 lb

## 2014-01-17 DIAGNOSIS — I8312 Varicose veins of left lower extremity with inflammation: Secondary | ICD-10-CM

## 2014-01-17 DIAGNOSIS — L03116 Cellulitis of left lower limb: Secondary | ICD-10-CM

## 2014-01-17 NOTE — Patient Instructions (Signed)
Return on Tuesday. Return sooner if problems.

## 2014-01-17 NOTE — Progress Notes (Signed)
Subjective: Patient is here for recheck of her leg.  Objective: Still erythematous with multiple excoriations, looks a little better than it was a few days ago.  Unna boot was applied  Assessment: Stasis dermatitis and cellulitis left leg  Continue her cephalexin. Keep the [employer] on until I see her on Tuesday at least.

## 2014-01-21 ENCOUNTER — Ambulatory Visit (INDEPENDENT_AMBULATORY_CARE_PROVIDER_SITE_OTHER): Payer: Medicare HMO | Admitting: Family Medicine

## 2014-01-21 VITALS — BP 138/74 | HR 71 | Temp 98.2°F | Resp 18 | Wt 227.0 lb

## 2014-01-21 DIAGNOSIS — I872 Venous insufficiency (chronic) (peripheral): Secondary | ICD-10-CM

## 2014-01-21 DIAGNOSIS — I8312 Varicose veins of left lower extremity with inflammation: Secondary | ICD-10-CM

## 2014-01-21 DIAGNOSIS — L03115 Cellulitis of right lower limb: Secondary | ICD-10-CM

## 2014-01-21 NOTE — Progress Notes (Signed)
Subjective: Patient is here for recheck with regard to her stasis dermatitis and cellulitis of her leg.  Objective: Much less erythema. The excoriated areas are healing in. The wounds all look good.  Assessment: Stasis dermatitis, improving with cellulitis essentially resolved  Plan: Reapply Unna boot. Have her return in one week.

## 2014-01-21 NOTE — Patient Instructions (Signed)
Same care

## 2014-01-28 ENCOUNTER — Ambulatory Visit (INDEPENDENT_AMBULATORY_CARE_PROVIDER_SITE_OTHER): Payer: Medicare HMO | Admitting: Family Medicine

## 2014-01-28 VITALS — BP 146/76 | HR 74 | Temp 98.2°F | Resp 18

## 2014-01-28 DIAGNOSIS — I8312 Varicose veins of left lower extremity with inflammation: Secondary | ICD-10-CM

## 2014-01-28 DIAGNOSIS — I8311 Varicose veins of right lower extremity with inflammation: Secondary | ICD-10-CM

## 2014-01-28 DIAGNOSIS — I872 Venous insufficiency (chronic) (peripheral): Secondary | ICD-10-CM

## 2014-01-28 DIAGNOSIS — L299 Pruritus, unspecified: Secondary | ICD-10-CM

## 2014-01-28 MED ORDER — TRIAMCINOLONE 0.1 % CREAM:EUCERIN CREAM 1:1: TOPICAL_CREAM | CUTANEOUS | Status: DC

## 2014-01-28 NOTE — Progress Notes (Signed)
Subjective: Patient is here to recheck the stasis dermatitis. She feels like it is doing better.  Objective: Unna boot removed. The skin is less inflamed. The ulcerated areas are much better.  Assessment: Stasis dermatitis, improving Chronic lymphedema  Plan: Try to keep her from digging at the skin by using some triamcinolone lotion on her. Recheck 1 week.

## 2014-01-28 NOTE — Patient Instructions (Signed)
Use the triamcinolone cream mixture sparingly on the legs every night. If it is looking significantly red or itching to much use twice daily.  Return again in about a week to recheck. I am here on the later shift on Tuesday and dayshift on Wednesday

## 2014-02-06 ENCOUNTER — Ambulatory Visit (INDEPENDENT_AMBULATORY_CARE_PROVIDER_SITE_OTHER): Payer: Commercial Managed Care - HMO | Admitting: Family Medicine

## 2014-02-06 VITALS — BP 164/70 | HR 70 | Temp 98.1°F | Resp 16

## 2014-02-06 DIAGNOSIS — I872 Venous insufficiency (chronic) (peripheral): Secondary | ICD-10-CM

## 2014-02-06 DIAGNOSIS — L299 Pruritus, unspecified: Secondary | ICD-10-CM

## 2014-02-06 DIAGNOSIS — I8312 Varicose veins of left lower extremity with inflammation: Secondary | ICD-10-CM

## 2014-02-06 DIAGNOSIS — I8311 Varicose veins of right lower extremity with inflammation: Secondary | ICD-10-CM

## 2014-02-06 DIAGNOSIS — T07XXXA Unspecified multiple injuries, initial encounter: Secondary | ICD-10-CM

## 2014-02-06 MED ORDER — HYDROXYZINE HCL 10 MG PO TABS: ORAL_TABLET | ORAL | Status: DC

## 2014-02-06 NOTE — Patient Instructions (Signed)
Continue the good care of your legs  Minimize soap when showering and take less frequent and/or shorter and cooler showers.  Be careful about any bed clothing that causes you more itching  Return in 2 weeks for recheck  Take the hydroxyzine maximum 3 times daily. You will usually need it just one at bedtime, but if you need a little more assistance at bedtime he can try taking 2 at bedtime.

## 2014-02-06 NOTE — Progress Notes (Signed)
Subjective: Patient is here for recheck of the rash on her legs. It is doing great, but she has rash elsewhere she is concerned about wants to show me.  Objective: The stasis dermatitis of the legs is much improved, the best I've seen it for years. She has aches almost no excoriations are legs. Her she has 1-2+ edema. Is wearing her stockings. Her trunk is terrible. She has excoriations of her upper arms, the skin from below the axilla around to the scapular regions, and down the sides of her back and abdomen. None look infected at this time. She says she doesn't scratch in the daytime but must do it at night. She is gotten rid of one of her nightgowns that was flannel, and we talked about just sleeping in a cotton T-shirt.  Assessment: Generalized itching Excoriations Stasis dermatitis much improved Cellulitis improved  Plan: Try a low dose of hydroxyzine and see if that will help her. She can use a little of the cream on the skin it is itching so much. Decrease soap use and decrease the long hot showers. Return in a couple weeks for recheck, sooner if worse.

## 2014-02-14 ENCOUNTER — Ambulatory Visit: Payer: Commercial Managed Care - HMO | Admitting: Family Medicine

## 2014-02-20 ENCOUNTER — Ambulatory Visit (INDEPENDENT_AMBULATORY_CARE_PROVIDER_SITE_OTHER): Payer: Commercial Managed Care - HMO | Admitting: Family Medicine

## 2014-02-20 VITALS — BP 122/76 | HR 69 | Temp 98.2°F | Resp 18

## 2014-02-20 DIAGNOSIS — I8312 Varicose veins of left lower extremity with inflammation: Secondary | ICD-10-CM

## 2014-02-20 DIAGNOSIS — I872 Venous insufficiency (chronic) (peripheral): Secondary | ICD-10-CM

## 2014-02-20 DIAGNOSIS — I8311 Varicose veins of right lower extremity with inflammation: Secondary | ICD-10-CM

## 2014-02-20 NOTE — Progress Notes (Signed)
Subjective: Here for a recheck of her legs. She feels like she is doing much better pretty consistently now. She is still not gotten the compression stockings. The triamcinolone/Eucerin combination has helped her a lot. She takes the antiaging medication occasionally.  Objective: Legs look much better. No excoriations or significant erythema. Has a bright pink looked to them. Edema is less.  Assessment: Stasis dermatitis Edema improved  Plan: Same treatments I still think stockings would probably help her if we could get them. This is a work in progress.  Return in 2 months

## 2014-02-20 NOTE — Patient Instructions (Addendum)
Continue current treatment  Return in 2 months

## 2014-02-21 ENCOUNTER — Telehealth: Payer: Self-pay

## 2014-02-21 NOTE — Telephone Encounter (Signed)
Robert from PT physical therapy referral for [facility]ning Will fax over [facility] to fax to [facility] for patients referral.  Patient appointment 03/06/14- to get compression therapy Robert: [phone]

## 2014-03-17 ENCOUNTER — Encounter: Payer: Self-pay | Admitting: Family Medicine

## 2014-03-17 ENCOUNTER — Ambulatory Visit (INDEPENDENT_AMBULATORY_CARE_PROVIDER_SITE_OTHER): Payer: Commercial Managed Care - HMO | Admitting: Family Medicine

## 2014-03-17 ENCOUNTER — Other Ambulatory Visit (HOSPITAL_COMMUNITY)
Admission: RE | Admit: 2014-03-17 | Discharge: 2014-03-17 | Disposition: A | Discharge status: Home / Self Care | Payer: Commercial Managed Care - HMO | Source: Ambulatory Visit | Attending: Family Medicine | Admitting: Family Medicine

## 2014-03-17 VITALS — BP 130/74 | HR 66 | Temp 97.7°F | Ht 64.0 in | Wt 229.4 lb

## 2014-03-17 DIAGNOSIS — I10 Essential (primary) hypertension: Secondary | ICD-10-CM

## 2014-03-17 DIAGNOSIS — Z124 Encounter for screening for malignant neoplasm of cervix: Secondary | ICD-10-CM | POA: Insufficient documentation

## 2014-03-17 DIAGNOSIS — E782 Mixed hyperlipidemia: Secondary | ICD-10-CM

## 2014-03-17 DIAGNOSIS — E669 Obesity, unspecified: Secondary | ICD-10-CM

## 2014-03-17 DIAGNOSIS — I6523 Occlusion and stenosis of bilateral carotid arteries: Secondary | ICD-10-CM

## 2014-03-17 DIAGNOSIS — D649 Anemia, unspecified: Secondary | ICD-10-CM

## 2014-03-17 DIAGNOSIS — M25551 Pain in right hip: Secondary | ICD-10-CM

## 2014-03-17 DIAGNOSIS — I872 Venous insufficiency (chronic) (peripheral): Secondary | ICD-10-CM

## 2014-03-17 DIAGNOSIS — K449 Diaphragmatic hernia without obstruction or gangrene: Secondary | ICD-10-CM

## 2014-03-17 DIAGNOSIS — E785 Hyperlipidemia, unspecified: Secondary | ICD-10-CM

## 2014-03-17 DIAGNOSIS — I739 Peripheral vascular disease, unspecified: Secondary | ICD-10-CM

## 2014-03-17 DIAGNOSIS — I779 Disorder of arteries and arterioles, unspecified: Secondary | ICD-10-CM

## 2014-03-17 DIAGNOSIS — M545 Low back pain: Secondary | ICD-10-CM

## 2014-03-17 DIAGNOSIS — K219 Gastro-esophageal reflux disease without esophagitis: Secondary | ICD-10-CM

## 2014-03-17 DIAGNOSIS — Z0000 Encounter for general adult medical examination without abnormal findings: Secondary | ICD-10-CM

## 2014-03-17 LAB — CBC
HEMATOCRIT: 31.9 % — AB (ref 36.0–46.0)
Hemoglobin: 10.6 g/dL — ABNORMAL LOW (ref 12.0–15.0)
MCHC: 33.4 g/dL (ref 30.0–36.0)
MCV: 87 fl (ref 78.0–100.0)
Platelets: 238 10*3/uL (ref 150.0–400.0)
RBC: 3.67 Mil/uL — ABNORMAL LOW (ref 3.87–5.11)
RDW: 16.3 % — AB (ref 11.5–15.5)
WBC: 4.6 10*3/uL (ref 4.0–10.5)

## 2014-03-17 LAB — COMPREHENSIVE METABOLIC PANEL
ALK PHOS: 113 U/L (ref 39–117)
ALT: 14 U/L (ref 0–35)
AST: 17 U/L (ref 0–37)
Albumin: 4.1 g/dL (ref 3.5–5.2)
BUN: 29 mg/dL — ABNORMAL HIGH (ref 6–23)
CHLORIDE: 105 meq/L (ref 96–112)
CO2: 25 meq/L (ref 19–32)
Calcium: 9.5 mg/dL (ref 8.4–10.5)
Creatinine, Ser: 0.93 mg/dL (ref 0.40–1.20)
GFR: 61.69 mL/min (ref >60.00)
GLUCOSE: 92 mg/dL (ref 70–99)
Potassium: 4.1 mEq/L (ref 3.5–5.1)
Sodium: 138 mEq/L (ref 135–145)
TOTAL PROTEIN: 6.8 g/dL (ref 6.0–8.3)
Total Bilirubin: 0.5 mg/dL (ref 0.2–1.2)

## 2014-03-17 LAB — LIPID PANEL
CHOL/HDL RATIO: 3
Cholesterol: 223 mg/dL — ABNORMAL HIGH (ref 0–200)
HDL: 74 mg/dL (ref >39.00)
LDL Cholesterol: 131 mg/dL — ABNORMAL HIGH (ref 0–99)
NonHDL: 149
TRIGLYCERIDES: 91 mg/dL (ref 0.0–149.0)
VLDL: 18.2 mg/dL (ref 0.0–40.0)

## 2014-03-17 LAB — TSH: TSH: 1.4 u[IU]/mL (ref 0.35–4.50)

## 2014-03-17 MED ORDER — NYSTATIN-TRIAMCINOLONE 100000-0.1 UNIT/GM-% EX OINT: 1.0000 "application " | TOPICAL_OINTMENT | Freq: Two times a day (BID) | CUTANEOUS | Status: DC | PRN

## 2014-03-17 NOTE — Patient Instructions (Addendum)
Salon Pas patches or gel twice daily to back, hip etc  If cream does not help then let us know and we will try a different cream    Contact Dermatitis Contact dermatitis is a reaction to certain substances that touch the skin. Contact dermatitis can be either irritant contact dermatitis or allergic contact dermatitis. Irritant contact dermatitis does not require previous exposure to the substance for a reaction to occur.Allergic contact dermatitis only occurs if you have been exposed to the substance before. Upon a repeat exposure, your body reacts to the substance.  CAUSES  Many substances can cause contact dermatitis. Irritant dermatitis is most commonly caused by repeated exposure to mildly irritating substances, such as:  Makeup.  Soaps.  Detergents.  Bleaches.  Acids.  Metal salts, such as nickel. Allergic contact dermatitis is most commonly caused by exposure to:  Poisonous plants.  Chemicals (deodorants, shampoos).  Jewelry.  Latex.  Neomycin in triple antibiotic cream.  Preservatives in products, including clothing. SYMPTOMS  The area of skin that is exposed may develop:  Dryness or flaking.  Redness.  Cracks.  Itching.  Pain or a burning sensation.  Blisters. With allergic contact dermatitis, there may also be swelling in areas such as the eyelids, mouth, or genitals.  DIAGNOSIS  Your caregiver can usually tell what the problem is by doing a physical exam. In cases where the cause is uncertain and an allergic contact dermatitis is suspected, a patch skin test may be performed to help determine the cause of your dermatitis. TREATMENT Treatment includes protecting the skin from further contact with the irritating substance by avoiding that substance if possible. Barrier creams, powders, and gloves may be helpful. Your caregiver may also recommend:  Steroid creams or ointments applied 2 times daily. For best results, soak the rash area in cool water for  20 minutes. Then apply the medicine. Cover the area with a plastic wrap. You can store the steroid cream in the refrigerator for a "chilly" effect on your rash. That may decrease itching. Oral steroid medicines may be needed in more severe cases.  Antibiotics or antibacterial ointments if a skin infection is present.  Antihistamine lotion or an antihistamine taken by mouth to ease itching.  Lubricants to keep moisture in your skin.  Burow's solution to reduce redness and soreness or to dry a weeping rash. Mix one packet or tablet of solution in 2 cups cool water. Dip a clean washcloth in the mixture, wring it out a bit, and put it on the affected area. Leave the cloth in place for 30 minutes. Do this as often as possible throughout the day.  Taking several cornstarch or baking soda baths daily if the area is too large to cover with a washcloth. Harsh chemicals, such as alkalis or acids, can cause skin damage that is like a burn. You should flush your skin for 15 to 20 minutes with cold water after such an exposure. You should also seek immediate medical care after exposure. Bandages (dressings), antibiotics, and pain medicine may be needed for severely irritated skin.  HOME CARE INSTRUCTIONS  Avoid the substance that caused your reaction.  Keep the area of skin that is affected away from hot water, soap, sunlight, chemicals, acidic substances, or anything else that would irritate your skin.  Do not scratch the rash. Scratching may cause the rash to become infected.  You may take cool baths to help stop the itching.  Only take over-the-counter or prescription medicines as directed by your  caregiver.  See your caregiver for follow-up care as directed to make sure your skin is healing properly. SEEK MEDICAL CARE IF:   Your condition is not better after 3 days of treatment.  You seem to be getting worse.  You see signs of infection such as swelling, tenderness, redness, soreness, or warmth  in the affected area.  You have any problems related to your medicines. Document Released: 01/22/2000 Document Revised: 04/18/2011 Document Reviewed: 06/29/2010 [facility] Patient Information 2015 [place], [place]. This information is not intended to replace advice given to you by your health care provider. Make sure you discuss any questions you have with your health care provider.

## 2014-03-17 NOTE — Assessment & Plan Note (Addendum)
Pap today, no concerns on exam today. Pap results normal. Will not likely benefit from further from further pelvic exams for screening u]purposes.

## 2014-03-17 NOTE — Progress Notes (Signed)
Pre visit review using our clinic review tool, if applicable. No additional management support is needed unless otherwise documented below in the visit note. 

## 2014-03-18 ENCOUNTER — Encounter: Payer: Self-pay | Admitting: *Deleted

## 2014-03-19 ENCOUNTER — Telehealth: Payer: Self-pay | Admitting: Family Medicine

## 2014-03-19 ENCOUNTER — Ambulatory Visit (HOSPITAL_BASED_OUTPATIENT_CLINIC_OR_DEPARTMENT_OTHER): Payer: Medicare PPO

## 2014-03-19 LAB — CYTOLOGY - PAP

## 2014-03-19 NOTE — Telephone Encounter (Signed)
Caller name:Trigueros, Karene Relation to EA:VWUJ Call back [place]:  Reason for call: pt is returning your call regarding her lab results please call back,

## 2014-03-20 ENCOUNTER — Telehealth: Payer: Self-pay | Admitting: Family Medicine

## 2014-03-20 NOTE — Telephone Encounter (Signed)
Patient has been informed of her results.  

## 2014-03-20 NOTE — Telephone Encounter (Signed)
Returned the patients called informed the patient of all lab results/pap smear dated 03/17/14.  Patient did verbalize understanding of results and PCP instructions.  Also informed the patient we mailed her a copy of her labs today.

## 2014-03-20 NOTE — Telephone Encounter (Signed)
Caller name:Shaunice Carignan Relationship to patient:Self Can be [phone]    Reason for call: PT states that she would like to go over the lab results- previous phone notes showing that results were discussed but she states she would like to speak with Shirlean Mylar more regarding-

## 2014-03-24 ENCOUNTER — Encounter: Payer: Self-pay | Admitting: Family Medicine

## 2014-03-24 DIAGNOSIS — M25551 Pain in right hip: Secondary | ICD-10-CM | POA: Insufficient documentation

## 2014-03-24 DIAGNOSIS — Z0000 Encounter for general adult medical examination without abnormal findings: Secondary | ICD-10-CM | POA: Insufficient documentation

## 2014-03-24 NOTE — Assessment & Plan Note (Signed)
Tolerating statin, encouraged heart healthy diet, avoid trans fats, minimize simple carbs and saturated fats. Increase exercise as tolerated 

## 2014-03-24 NOTE — Assessment & Plan Note (Addendum)
Is following with [facility], receiving cortisone injections with minimal results. May try Salon Pas gel prn

## 2014-03-24 NOTE — Assessment & Plan Note (Signed)
Avoid offending foods, take probiotics. Do not eat large meals in late evening and consider raising head of bed. Encouraged small meals due to hiatal hernia.

## 2014-03-24 NOTE — Assessment & Plan Note (Signed)
Will monitor, CBC performed today confirms persistent anemia, encouraged daily iron and recheck cbc

## 2014-03-24 NOTE — Assessment & Plan Note (Signed)
Encouraged DASH diet, decrease po intake and increase exercise as tolerated. Needs 7-8 hours of sleep nightly. Avoid trans fats, eat small, frequent meals every 4-5 hours with lean proteins, complex carbs and healthy fats. Minimize simple carbs 

## 2014-03-24 NOTE — Assessment & Plan Note (Signed)
Patient encouraged to maintain heart healthy diet, regular exercise, adequate sleep. Consider daily probiotics. Take medications as prescribed. Will return in 6 months for medicare wellness exam. Is doing well with ADLs needs  Minimal assistance at her facility. Labs ordered and reviewed today

## 2014-03-24 NOTE — Assessment & Plan Note (Signed)
Well controlled, no changes to meds. Encouraged heart healthy diet such as the DASH diet and exercise as tolerated.  °

## 2014-03-24 NOTE — Assessment & Plan Note (Signed)
Ultrasound ordered today

## 2014-03-24 NOTE — Assessment & Plan Note (Signed)
Improved with recent wound care, unna boots and antibioitics. Encouraged to stay as active as possible and elevate feet when possible
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
## 2014-03-25 ENCOUNTER — Ambulatory Visit (HOSPITAL_BASED_OUTPATIENT_CLINIC_OR_DEPARTMENT_OTHER)
Admission: RE | Admit: 2014-03-25 | Discharge: 2014-03-25 | Disposition: A | Discharge status: Home / Self Care | Payer: Commercial Managed Care - HMO | Source: Ambulatory Visit | Attending: Family Medicine | Admitting: Family Medicine

## 2014-03-25 ENCOUNTER — Other Ambulatory Visit (HOSPITAL_BASED_OUTPATIENT_CLINIC_OR_DEPARTMENT_OTHER): Payer: Commercial Managed Care - HMO

## 2014-03-25 DIAGNOSIS — I6523 Occlusion and stenosis of bilateral carotid arteries: Secondary | ICD-10-CM | POA: Diagnosis present

## 2014-03-27 ENCOUNTER — Telehealth: Payer: Self-pay | Admitting: Family Medicine

## 2014-03-27 NOTE — Telephone Encounter (Signed)
This patient has been having a vaginal discharge, clear, no odor. She does have some ithching but was given something for this at her last office visit.

## 2014-03-27 NOTE — Telephone Encounter (Signed)
Is she using the cream? If so does she think it has made this condition better or worse. If not she should try it for a week and see if it helps.

## 2014-03-28 NOTE — Telephone Encounter (Signed)
Patient informed of PCP instructions and did agree to all .

## 2014-04-01 ENCOUNTER — Telehealth: Payer: Self-pay

## 2014-04-01 NOTE — Telephone Encounter (Signed)
Pt would like Dr. Linna Darner to know she is coming in tomorrow.

## 2014-04-03 ENCOUNTER — Ambulatory Visit (INDEPENDENT_AMBULATORY_CARE_PROVIDER_SITE_OTHER): Payer: Commercial Managed Care - HMO | Admitting: Family Medicine

## 2014-04-03 VITALS — BP 128/68 | HR 65 | Temp 97.6°F | Resp 18 | Wt 236.2 lb

## 2014-04-03 DIAGNOSIS — I872 Venous insufficiency (chronic) (peripheral): Secondary | ICD-10-CM

## 2014-04-03 DIAGNOSIS — I8311 Varicose veins of right lower extremity with inflammation: Secondary | ICD-10-CM

## 2014-04-03 DIAGNOSIS — S40811A Abrasion of right upper arm, initial encounter: Secondary | ICD-10-CM

## 2014-04-03 DIAGNOSIS — N898 Other specified noninflammatory disorders of vagina: Secondary | ICD-10-CM

## 2014-04-03 DIAGNOSIS — J810 Acute pulmonary edema: Secondary | ICD-10-CM

## 2014-04-03 DIAGNOSIS — D649 Anemia, unspecified: Secondary | ICD-10-CM

## 2014-04-03 DIAGNOSIS — I8312 Varicose veins of left lower extremity with inflammation: Secondary | ICD-10-CM

## 2014-04-03 LAB — POCT URINALYSIS DIPSTICK
BILIRUBIN UA: NEGATIVE
GLUCOSE UA: NEGATIVE
Ketones, UA: NEGATIVE
NITRITE UA: NEGATIVE
Protein, UA: 100
Spec Grav, UA: 1.025
UROBILINOGEN UA: 0.2
pH, UA: 5.5

## 2014-04-03 LAB — POCT UA - MICROSCOPIC ONLY
CRYSTALS, UR, HPF, POC: NEGATIVE
Casts, Ur, LPF, POC: NEGATIVE
Mucus, UA: NEGATIVE
Yeast, UA: NEGATIVE

## 2014-04-03 NOTE — Telephone Encounter (Signed)
Noted  

## 2014-04-03 NOTE — Progress Notes (Signed)
Subjective: Patient is here because of her chronic stasis dermatitis. She has finally gotten the compression stockings and is using them as directed one hour daily. She says the legs are the best they have been. She is quite pleased with that. She did go to another doctor and have a Pap smear done because she worries about her GYN problems in her family. She has an excoriated area on her upper arm she wants me to look at.  Objective: Excoriation right upper arm with some bruising. She has scratched it pretty hard. No other lesion noted. Chest clear. Heart regular without murmurs. She has a large irregular cystic lesion on her right knee. She has talked to an orthopedic doctor about this who attempted aspiration and then decided not to do anything with it. She says it keeps gradually getting larger and it hurts her. The legs look much better with the stasis dermatitis very well controlled. Mild erythema of the skin. Edema is much less.  Assessment: Excoriation right upper arm Stasis dermatitis Chronic lymphedema Cyst left knee  Plan: Get me the MRI of the cyst so I can decide on sending her to a general surgeon since the orthopedist didn't want to take it off.  Continue current treatments. Return as needed or in 6-12 weeks

## 2014-04-03 NOTE — Patient Instructions (Signed)
Return in early April.  Return sooner if problems  Use the cream on the arm

## 2014-04-04 ENCOUNTER — Telehealth: Payer: Self-pay | Admitting: Family Medicine

## 2014-04-04 NOTE — Telephone Encounter (Signed)
Patient called about several things. Her urinalysis and some things on her AVS (acute edema??). Please call her at  [phone]

## 2014-04-04 NOTE — Telephone Encounter (Signed)
Pt called to inquire as to urinalysis results from the other evening. Please advise .

## 2014-04-05 NOTE — Telephone Encounter (Signed)
Call: I failed to take care of this the other day.  She does have a UTI.  Please rx: Keflex 500   #15  Take one pill 3 times daily.Marland Kitchen

## 2014-04-07 MED ORDER — CEPHALEXIN 500 MG PO CAPS: 500.0000 mg | ORAL_CAPSULE | Freq: Three times a day (TID) | ORAL | Status: DC

## 2014-04-07 NOTE — Telephone Encounter (Signed)
Spoke with pt, advised him of Rx sent in to the pharmacy. He would also like to see if we could refill the ointment below. He states Dr. Charlett Blake had given her this and needs a refill if could. Please advise. It looks like Dr. Charlett Blake sent the ointment in on 03/17/2014.  nystatin-triamcinolone ointment [facility]) [[id number]]      Order Details    Dose: 1 application Route: Topical Frequency: 2 times daily PRN   Dispense Quantity:  60 g Refills:  2 Fills Remaining:  2          Sig: Apply 1 application topically 2 (two) times daily as needed. Rash, itching to affected area         Written Date:  03/17/14 Expiration Date:  03/17/15     Start Date:  03/17/14 End Date:  --     Ordering Provider:  -- Authorizing Provider:  Mosie Lukes, MD Ordering User:  Mosie Lukes, MD                    Pharmacy:  [facility]          Spoke with pharmacy, they have a refill on this cream already. I advised them to refill this also.

## 2014-04-07 NOTE — Telephone Encounter (Signed)
Pt would like for Dr. Linna Darner to call her. I spoke with pt on the phone for 15 minutes, I am not sure why she wants Dr. Linna Darner to call.

## 2014-04-09 NOTE — Telephone Encounter (Signed)
Tried to call yesterday afternoon.  No answer.  i did not leave message.

## 2014-04-29 ENCOUNTER — Other Ambulatory Visit: Payer: Self-pay | Admitting: Physician Assistant

## 2014-05-01 ENCOUNTER — Telehealth: Payer: Self-pay

## 2014-05-01 NOTE — Telephone Encounter (Signed)
Patient's daughter dropped off a disc of patients MRI left knee to give to Dr Linna Darner to review it. I have put it in his box at the walk in clinic. Patients call back number is [phone].

## 2014-05-02 NOTE — Telephone Encounter (Signed)
FYI dr hopper

## 2014-05-08 NOTE — Telephone Encounter (Signed)
Gave pt message. She is coming in next week to see Dr Linna Darner.

## 2014-05-08 NOTE — Telephone Encounter (Signed)
Please call her back: I looked at the MRI disc she left me but cannot tell much about it other than the mass is there.  I will leave it to the specialists to decide what it is and what to do with it.  Discuss further next visit.

## 2014-05-14 ENCOUNTER — Ambulatory Visit (INDEPENDENT_AMBULATORY_CARE_PROVIDER_SITE_OTHER): Payer: Commercial Managed Care - HMO | Admitting: Family Medicine

## 2014-05-14 ENCOUNTER — Ambulatory Visit (INDEPENDENT_AMBULATORY_CARE_PROVIDER_SITE_OTHER): Payer: Commercial Managed Care - HMO

## 2014-05-14 VITALS — BP 144/86 | HR 68 | Temp 97.9°F | Resp 18 | Ht 63.0 in | Wt 231.0 lb

## 2014-05-14 DIAGNOSIS — M799 Soft tissue disorder, unspecified: Secondary | ICD-10-CM

## 2014-05-14 DIAGNOSIS — M25551 Pain in right hip: Secondary | ICD-10-CM | POA: Diagnosis not present

## 2014-05-14 DIAGNOSIS — M1612 Unilateral primary osteoarthritis, left hip: Secondary | ICD-10-CM

## 2014-05-14 DIAGNOSIS — M7989 Other specified soft tissue disorders: Secondary | ICD-10-CM

## 2014-05-14 DIAGNOSIS — D649 Anemia, unspecified: Secondary | ICD-10-CM

## 2014-05-14 LAB — POCT CBC
GRANULOCYTE PERCENT: 76.6 % (ref 37–80)
HCT, POC: 33.6 % — AB (ref 37.7–47.9)
HEMOGLOBIN: 10.3 g/dL — AB (ref 12.2–16.2)
Lymph, poc: 1 (ref 0.6–3.4)
MCH: 27.9 pg (ref 27–31.2)
MCHC: 30.7 g/dL — AB (ref 31.8–35.4)
MCV: 90.8 fL (ref 80–97)
MID (cbc): 0.2 (ref 0–0.9)
MPV: 6.7 fL (ref 0–99.8)
POC GRANULOCYTE: 4 (ref 2–6.9)
POC LYMPH PERCENT: 20 %L (ref 10–50)
POC MID %: 3.4 % (ref 0–12)
Platelet Count, POC: 194 10*3/uL (ref 142–424)
RBC: 3.7 M/uL — AB (ref 4.04–5.48)
RDW, POC: 18.1 %
WBC: 5.2 10*3/uL (ref 4.6–10.2)

## 2014-05-14 NOTE — Progress Notes (Signed)
Subjective: 81 year old lady well-known to me. She is here for a regular checkup with regard several things. Her right hip continues to bother her a lot. The dermatitis on the legs been doing wonderfully since she has been using the pump devices. She uses them almost every day. She says the skin on the legs as best it's been in years. She has a history of anemia for years. Had to get a couple units of transfusion 2 years ago. She does not use excessive alcohol, maybe a glass of wine twice a week. She continues on her other regular medications. She has been taking some Aleve for pain occasionally. We talked about that it would be best to be off of that. Previous ferritin was good.  Objective: Pleasant lady alert and oriented. Walks with a bad limp. Right hip is very painful. The skin of both lower extremities is excellent, best I have ever seen it. Minimal edema today.  Assessment: Stasis dermatitis much improved Degenerative osteoarthritis of right hip Soft tissue mass/cyst left knee  Plan: Refer to general surgery to check the left knee since her orthopedist felt like it needed to be seen by general surgeon Refer to a different orthopedist for further evaluation of the hip. She had been told in the past she should never have a hip replacement, though it appears to me that she needs one. I think with her cellulitis now resolved she is a candidate. When she had chronically inflamed legs she was a poor candidate.  Follow-up on the anemia late May       Results for orders placed or performed in visit on 05/14/14  POCT CBC  Result Value Ref Range   WBC 5.2 4.6 - 10.2 K/uL   Lymph, poc 1.0 0.6 - 3.4   POC LYMPH PERCENT 20.0 10 - 50 %L   MID (cbc) 0.2 0 - 0.9   POC MID % 3.4 0 - 12 %M   POC Granulocyte 4.0 2 - 6.9   Granulocyte percent 76.6 37 - 80 %G   RBC 3.70 (A) 4.04 - 5.48 M/uL   Hemoglobin 10.3 (A) 12.2 - 16.2 g/dL   HCT, POC 33.6 (A) 37.7 - 47.9 %   MCV 90.8 80 - 97 fL   MCH, POC  27.9 27 - 31.2 pg   MCHC 30.7 (A) 31.8 - 35.4 g/dL   RDW, POC 18.1 %   Platelet Count, POC 194 142 - 424 K/uL   MPV 6.7 0 - 99.8 fL   [facility] reading (PRIMARY) by  Dr. Linna Darner djd hip right, severe .

## 2014-05-14 NOTE — Patient Instructions (Addendum)
Continue physical therapy  Referral is being made for evaluation of your hip by Dr. Marchia Bond  Return in about 6 weeks, sooner if problems  Continue current treatment for the stasis dermatitis which is doing excellently.  Referral is being made to general surgery for evaluation of the soft tissue mass on your left knee.

## 2014-05-15 ENCOUNTER — Encounter: Payer: Self-pay | Admitting: Family Medicine

## 2014-05-16 ENCOUNTER — Ambulatory Visit (INDEPENDENT_AMBULATORY_CARE_PROVIDER_SITE_OTHER): Payer: Commercial Managed Care - HMO | Admitting: Family Medicine

## 2014-05-16 VITALS — BP 140/58 | HR 59 | Temp 97.7°F | Resp 16

## 2014-05-16 DIAGNOSIS — R197 Diarrhea, unspecified: Secondary | ICD-10-CM | POA: Diagnosis not present

## 2014-05-16 DIAGNOSIS — J029 Acute pharyngitis, unspecified: Secondary | ICD-10-CM | POA: Diagnosis not present

## 2014-05-16 MED ORDER — DIPHENOXYLATE-ATROPINE 2.5-0.025 MG PO TABS: 1.0000 | ORAL_TABLET | Freq: Three times a day (TID) | ORAL | Status: DC | PRN
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
## 2014-05-16 NOTE — Patient Instructions (Signed)

## 2014-06-05 ENCOUNTER — Telehealth: Payer: Self-pay

## 2014-06-05 ENCOUNTER — Other Ambulatory Visit: Payer: Self-pay | Admitting: Family Medicine

## 2014-06-05 NOTE — Telephone Encounter (Signed)
error 

## 2014-06-05 NOTE — Telephone Encounter (Signed)
Do you want to RF? 

## 2014-06-09 ENCOUNTER — Other Ambulatory Visit: Payer: Self-pay | Admitting: Family Medicine

## 2014-06-09 ENCOUNTER — Telehealth: Payer: Self-pay

## 2014-06-09 NOTE — Telephone Encounter (Signed)
Patient is calling to follow up on a referral. Patient would like to receive a phone call to know what doctor she's going to see.

## 2014-06-11 NOTE — Telephone Encounter (Signed)
Faxed

## 2014-06-22 ENCOUNTER — Telehealth: Payer: Self-pay | Admitting: *Deleted

## 2014-06-22 NOTE — Telephone Encounter (Signed)
Pt called to report that she is continuing to have diarrhea. Reports no vomiting or blood in her stool. Wanted to let you know that she would be coming in to see you tomorrow for a follow up of this problem.

## 2014-06-23 ENCOUNTER — Ambulatory Visit (INDEPENDENT_AMBULATORY_CARE_PROVIDER_SITE_OTHER): Payer: Commercial Managed Care - HMO | Admitting: Family Medicine

## 2014-06-23 VITALS — BP 144/62 | HR 72 | Temp 98.1°F | Resp 14 | Ht 63.0 in | Wt 226.4 lb

## 2014-06-23 DIAGNOSIS — E871 Hypo-osmolality and hyponatremia: Secondary | ICD-10-CM | POA: Diagnosis not present

## 2014-06-23 DIAGNOSIS — R531 Weakness: Secondary | ICD-10-CM | POA: Diagnosis not present

## 2014-06-23 DIAGNOSIS — D649 Anemia, unspecified: Secondary | ICD-10-CM | POA: Diagnosis not present

## 2014-06-23 DIAGNOSIS — R197 Diarrhea, unspecified: Secondary | ICD-10-CM | POA: Diagnosis not present

## 2014-06-23 LAB — POCT CBC
Granulocyte percent: 77.6 %G (ref 37–80)
HCT, POC: 30.5 % — AB (ref 37.7–47.9)
Hemoglobin: 9.9 g/dL — AB (ref 12.2–16.2)
Lymph, poc: 1 (ref 0.6–3.4)
MCH, POC: 28.8 pg (ref 27–31.2)
MCHC: 32.4 g/dL (ref 31.8–35.4)
MCV: 88.8 fL (ref 80–97)
MID (cbc): 0.3 (ref 0–0.9)
MPV: 6.2 fL (ref 0–99.8)
POC Granulocyte: 4.6 (ref 2–6.9)
POC LYMPH PERCENT: 17.7 %L (ref 10–50)
POC MID %: 4.7 %M (ref 0–12)
Platelet Count, POC: 198 10*3/uL (ref 142–424)
RBC: 3.44 M/uL — AB (ref 4.04–5.48)
RDW, POC: 16.3 %
WBC: 5.9 10*3/uL (ref 4.6–10.2)

## 2014-06-23 LAB — COMPREHENSIVE METABOLIC PANEL
ALT: 12 U/L (ref 0–35)
AST: 15 U/L (ref 0–37)
Albumin: 4 g/dL (ref 3.5–5.2)
Alkaline Phosphatase: 114 U/L (ref 39–117)
BUN: 13 mg/dL (ref 6–23)
CO2: 24 mEq/L (ref 19–32)
Calcium: 9.6 mg/dL (ref 8.4–10.5)
Chloride: 98 mEq/L (ref 96–112)
Creat: 0.91 mg/dL (ref 0.50–1.10)
Glucose, Bld: 138 mg/dL — ABNORMAL HIGH (ref 70–99)
Potassium: 4.7 mEq/L (ref 3.5–5.3)
Sodium: 132 mEq/L — ABNORMAL LOW (ref 135–145)
Total Bilirubin: 0.6 mg/dL (ref 0.2–1.2)
Total Protein: 6.3 g/dL (ref 6.0–8.3)

## 2014-06-23 LAB — FERRITIN: Ferritin: 48 ng/mL (ref 10–291)

## 2014-06-23 MED ORDER — METRONIDAZOLE 250 MG PO TABS: 250.0000 mg | ORAL_TABLET | Freq: Three times a day (TID) | ORAL | Status: DC

## 2014-06-23 NOTE — Progress Notes (Signed)
Subjective:  This chart was scribed for Robyn Haber MD, by Tamsen Roers, at [facility].  This patient was seen in room 11 and the patient's care was started at 9:13 AM.    Patient ID: Chelsea Browning, female    DOB: January 19, 1933, 81 y.o.   MRN: [id number] Chief Complaint  Patient presents with   Diarrhea   Fatigue   Headache    HPI  HPI Comments: Chelsea Browning is a 81 y.o. female who presents to the [facility] complaining of diarrhea (light brown), fatigue and a headache.  She has associated symptoms of abdominal cramping and feels like she has to sit down/feels worn out all the time.  Patient notes that on April 4th she had mild diarrhea and a sore throat and still has intermittent watery diarrhea. When she takes her medication, her diarrhea goes away for a couple days and comes back soon after.  She has been keeping her diet very strict only eating jello, bananas apples, drinking tea/gatorade and has lost 5 pounds.  She has not had anything to eat today.  She denies any hematochezia, fever or vomiting/nausea. Patient has taken clindamycin in the past which caused her to have diarrhea so she was switched to cephalexin. She has no other concerns today.    Past Medical History  Diagnosis Date   Cataract    Blood transfusion without reported diagnosis    Cellulitis    Urinary incontinence     at night   Obesity, unspecified 07/24/2013   Anemia    Arthritis    Anxiety    HLD (hyperlipidemia)    Depression with anxiety    Esophageal reflux 07/24/2013   HTN (hypertension) 07/24/2013   Hiatal hernia with gastroesophageal reflux 07/24/2013   Pedal edema 07/24/2013   Hyponatremia    Carotid artery disease 07/24/2013   Cervical cancer screening 03/17/2014   Preventative health care 03/24/2014   Left hip pain 03/24/2014   Right hip pain 03/24/2014    Current Outpatient Prescriptions on File Prior to Visit  Medication  Sig Dispense Refill   acetaminophen (TYLENOL) 500 MG tablet Take 1,000 mg by mouth every 6 (six) hours as needed for moderate pain.     atorvastatin (LIPITOR) 80 MG tablet Take 80 mg by mouth at bedtime.      diphenoxylate-atropine (LOMOTIL) 2.5-0.025 MG per tablet TAKE 1 TABLET 3 TIMES DAILY AS NEEDED FOR DIARRHEA OR LOOSE STOOLS. 15 tablet 2   dipyridamole (PERSANTINE) 75 MG tablet TAKE 1 TABLET TWICE DAILY. 60 tablet 4   ferrous sulfate 325 (65 FE) MG tablet Take 325 mg by mouth daily with breakfast.     HYDROcodone-acetaminophen (NORCO) 7.5-325 MG per tablet Take 1 tablet by mouth every 6 (six) hours as needed for moderate pain.     hydrOXYzine (ATARAX/VISTARIL) 10 MG tablet Take one every 6 or 8 hours if needed for itching 60 tablet 2   labetalol (NORMODYNE) 200 MG tablet Take 200 mg by mouth 3 (three) times daily. 2 OR 3 times a day.     lactobacillus acidophilus (BACID) TABS tablet Take 1 tablet by mouth daily.     mupirocin ointment (BACTROBAN) 2 % Apply 1 application topically 3 (three) times daily. 22 g 5   nystatin-triamcinolone ointment (MYCOLOG) Apply 1 application topically 2 (two) times daily as needed. Rash, itching to affected area 60 g 2   omeprazole (PRILOSEC) 20 MG capsule Take 1 capsule (20 mg total) by  mouth daily. 30 capsule 11   Triamcinolone Acetonide (TRIAMCINOLONE 0.1 % CREAM : EUCERIN) CREA Use once or twice daily on the legs 1 each 1   No current facility-administered medications on file prior to visit.    Allergies  Allergen Reactions   Augmentin [Amoxicillin-Pot Clavulanate] Hives   Doxycycline Rash       Review of Systems  Constitutional: Positive for fatigue. Negative for fever and chills.  Gastrointestinal: Positive for diarrhea. Negative for vomiting, blood in stool and anal bleeding.  Neurological: Positive for headaches.       Objective:   Physical Exam  Constitutional: She appears well-developed and well-nourished. No distress.    HENT:  Head: Normocephalic and atraumatic.  Eyes: Right eye exhibits no discharge. Left eye exhibits no discharge.  Pulmonary/Chest: Effort normal. No respiratory distress.  Abdominal:  Hyperactive bowel sounds.    Skin: No rash noted. She is not diaphoretic.  Psychiatric: She has a normal mood and affect. Her behavior is normal.  Nursing note and vitals reviewed.   Filed Vitals:   06/23/14 0906  BP: 144/62  Pulse: 72  Temp: 98.1 F (36.7 C)  TempSrc: Oral  Resp: 14  Height: 5\' 3"  (1.6 m)  Weight: 226 lb 6.4 oz (102.694 kg)  SpO2: 98%   Results for orders placed or performed in visit on 06/23/14  POCT CBC  Result Value Ref Range   WBC 5.9 4.6 - 10.2 K/uL   Lymph, poc 1.0 0.6 - 3.4   POC LYMPH PERCENT 17.7 10 - 50 %L   MID (cbc) 0.3 0 - 0.9   POC MID % 4.7 0 - 12 %M   POC Granulocyte 4.6 2 - 6.9   Granulocyte percent 77.6 37 - 80 %G   RBC 3.44 (A) 4.04 - 5.48 M/uL   Hemoglobin 9.9 (A) 12.2 - 16.2 g/dL   HCT, POC 30.5 (A) 37.7 - 47.9 %   MCV 88.8 80 - 97 fL   MCH, POC 28.8 27 - 31.2 pg   MCHC 32.4 31.8 - 35.4 g/dL   RDW, POC 16.3 %   Platelet Count, POC 198 142 - 424 K/uL   MPV 6.2 0 - 99.8 fL         Assessment & Plan:   This chart was scribed in my presence and reviewed by me personally.    ICD-9-CM ICD-10-CM   1. Diarrhea 787.91 R19.7 POCT CBC     Comprehensive metabolic panel     Stool culture     metroNIDAZOLE (FLAGYL) 250 MG tablet  2. Weakness 780.79 R53.1 POCT CBC     Comprehensive metabolic panel     Stool culture  3. Hyponatremia 276.1 E87.1 POCT CBC     Comprehensive metabolic panel     Stool culture  4. Anemia, unspecified anemia type 285.9 D64.9 Ferritin     Signed, Robyn Haber, MD

## 2014-06-23 NOTE — Patient Instructions (Signed)
Please call me on Thursday to let you know how you are doing.

## 2014-06-24 ENCOUNTER — Telehealth: Payer: Self-pay

## 2014-06-24 NOTE — Telephone Encounter (Signed)
Chelsea Browning spoke with her concerning her labs

## 2014-06-24 NOTE — Telephone Encounter (Signed)
Pt missed her call and would like a call back. She is very nervous, she thinks it is concerning labs. Please advise at [phone]

## 2014-06-25 ENCOUNTER — Telehealth: Payer: Self-pay

## 2014-06-25 NOTE — Telephone Encounter (Signed)
PATIENT STATES SHE SAW DR. Domingo Dimes FOR A STOMACH INFECTION. HE PRESCRIBED HER FLAGYL. SHE THINKS THE MEDICINE MIGHT BE WORKING BUT SHE IS NOT SURE. SHE REALLY, REALLY WANTS DR. Joseph Art TO CALL  HER BACK AS SOON AS POSSIBLE. BEST PHONE [phone] (HOME)  [place]

## 2014-06-26 NOTE — Telephone Encounter (Signed)
Spoke with pt. She thought she had C-diff, i told her that we didn't know that for sure. She states she feels the diarrhea is getting better and that the flagyl is helping. We are still waiting on stool cx. She will come in tomorrow to see dr Joseph Art.

## 2014-06-27 ENCOUNTER — Ambulatory Visit (INDEPENDENT_AMBULATORY_CARE_PROVIDER_SITE_OTHER): Payer: Commercial Managed Care - HMO | Admitting: Family Medicine

## 2014-06-27 VITALS — BP 189/75 | HR 68 | Temp 98.0°F | Resp 18 | Wt 225.0 lb

## 2014-06-27 DIAGNOSIS — R197 Diarrhea, unspecified: Secondary | ICD-10-CM | POA: Diagnosis not present

## 2014-06-27 NOTE — Progress Notes (Signed)
81 yo with recent diarrhea and weakness.  She was started on flagyl 5 days ago and after just 2 days.  No Lomotil in 2 days.  She is feeling stronger and eating a regular diet.  Objective:  BP 189/75 mmHg  Pulse 68  Temp(Src) 98 F (36.7 C) (Oral)  Resp 18  Wt 225 lb (102.059 kg)  SpO2 98%  BP 150/66 recheck left arm seated Chest clear Heart reg, no murmur or gallop Abdomen:  Normal BS, nontender, no mass or HSM and no distension. Ext:  No significant edema.  Assessment:  Marked improvement for what appears to have been a C. Diff infection  Plan:  Finish Flagyl Stop Lomotil Regular diet.  Signed, Robyn Haber, MD

## 2014-06-27 NOTE — Patient Instructions (Signed)
Regular diet Finish the Flagyl Let me know if diarrhea returns.

## 2014-06-28 ENCOUNTER — Other Ambulatory Visit: Payer: Self-pay | Admitting: Physician Assistant

## 2014-06-28 LAB — STOOL CULTURE

## 2014-07-04 ENCOUNTER — Ambulatory Visit (INDEPENDENT_AMBULATORY_CARE_PROVIDER_SITE_OTHER): Payer: Commercial Managed Care - HMO | Admitting: Family Medicine

## 2014-07-04 VITALS — BP 140/72 | HR 70 | Temp 98.8°F | Resp 20 | Wt 225.0 lb

## 2014-07-04 DIAGNOSIS — D649 Anemia, unspecified: Secondary | ICD-10-CM

## 2014-07-04 DIAGNOSIS — I8311 Varicose veins of right lower extremity with inflammation: Secondary | ICD-10-CM

## 2014-07-04 DIAGNOSIS — I8312 Varicose veins of left lower extremity with inflammation: Secondary | ICD-10-CM

## 2014-07-04 DIAGNOSIS — I872 Venous insufficiency (chronic) (peripheral): Secondary | ICD-10-CM

## 2014-07-04 DIAGNOSIS — E871 Hypo-osmolality and hyponatremia: Secondary | ICD-10-CM

## 2014-07-04 LAB — POCT CBC
Granulocyte percent: 75.3 %G (ref 37–80)
HEMATOCRIT: 31.2 % — AB (ref 37.7–47.9)
HEMOGLOBIN: 10.2 g/dL — AB (ref 12.2–16.2)
Lymph, poc: 0.9 (ref 0.6–3.4)
MCH, POC: 28.8 pg (ref 27–31.2)
MCHC: 32.6 g/dL (ref 31.8–35.4)
MCV: 88.4 fL (ref 80–97)
MID (cbc): 0.3 (ref 0–0.9)
MPV: 5.6 fL (ref 0–99.8)
PLATELET COUNT, POC: 258 10*3/uL (ref 142–424)
POC Granulocyte: 3.8 (ref 2–6.9)
POC LYMPH %: 18.1 % (ref 10–50)
POC MID %: 6.6 % (ref 0–12)
RBC: 3.54 M/uL — AB (ref 4.04–5.48)
RDW, POC: 16 %
WBC: 5 10*3/uL (ref 4.6–10.2)

## 2014-07-04 LAB — BASIC METABOLIC PANEL
BUN: 25 mg/dL — ABNORMAL HIGH (ref 6–23)
CALCIUM: 9.5 mg/dL (ref 8.4–10.5)
CO2: 28 meq/L (ref 19–32)
Chloride: 97 mEq/L (ref 96–112)
Creat: 0.87 mg/dL (ref 0.50–1.10)
Glucose, Bld: 124 mg/dL — ABNORMAL HIGH (ref 70–99)
Potassium: 5 mEq/L (ref 3.5–5.3)
SODIUM: 131 meq/L — AB (ref 135–145)

## 2014-07-04 NOTE — Patient Instructions (Signed)
Check the Hemoccults cards before your return visit in mid to late July  Return sooner if the diarrhea reoccurs  I will let you know the results of your labs, specifically the sodium

## 2014-07-04 NOTE — Progress Notes (Signed)
  Subjective:  Patient ID: Chelsea Browning, female    DOB: Nov 24, 1932  Age: 81 y.o. MRN: [id number]  81 year old patient of mine who had a lot of troubles while always gone. She had a persistent diarrhea that Dr. Joseph Art treated for. She finally is doing better from that and her bowel movement that she had this morning was normal and formed. She was concerned because her sodium has been low. She was treated for C. difficile which finally cleared her up. The stool cultures did not reveal anything, although clostridium difficile antigen was not done. Her legs continue to do well. She still has problems with right hip and soft Dr. Mardelle Matte regarding that yesterday. He is arranging for her to have injections of steroid-induced. If she cannot get relief he will have to go ahead and do surgery, but he would prefer not to have to do it if possible. She is not passing any blood in her stool, concerned about the anemia   Objective:  No acute distress. Chest clear. Heart regular. Abdomen soft without masses or tenderness. Some debris in the umbilicus. Extremities don't have edema and the stasis dermatitis is the best it is been. Results for orders placed or performed in visit on 07/04/14  POCT CBC  Result Value Ref Range   WBC 5.0 4.6 - 10.2 K/uL   Lymph, poc 0.9 0.6 - 3.4   POC LYMPH PERCENT 18.1 10 - 50 %L   MID (cbc) 0.3 0 - 0.9   POC MID % 6.6 0 - 12 %M   POC Granulocyte 3.8 2 - 6.9   Granulocyte percent 75.3 37 - 80 %G   RBC 3.54 (A) 4.04 - 5.48 M/uL   Hemoglobin 10.2 (A) 12.2 - 16.2 g/dL   HCT, POC 31.2 (A) 37.7 - 47.9 %   MCV 88.4 80 - 97 fL   MCH, POC 28.8 27 - 31.2 pg   MCHC 32.6 31.8 - 35.4 g/dL   RDW, POC 16.0 %   Platelet Count, POC 258 142 - 424 K/uL   MPV 5.6 0 - 99.8 fL     Assessment & Plan:   Assessment: Clostridium difficile diarrhea, improved Anemia, etiology undetermined, slightly improved Stasis dermatitis well-controlled DJD right hip  Plan: Return in 2 months for  reassessment, sooner if problems Patient Instructions  Check the Hemoccults cards before your return visit in mid to late July  Return sooner if the diarrhea reoccurs  I will let you know the results of your labs, specifically the sodium     HOPPER,DAVID, MD 07/04/2014

## 2014-07-08 ENCOUNTER — Encounter: Payer: Self-pay | Admitting: *Deleted

## 2014-07-08 ENCOUNTER — Telehealth: Payer: Self-pay

## 2014-07-08 ENCOUNTER — Other Ambulatory Visit: Payer: Self-pay | Admitting: Gastroenterology

## 2014-07-08 NOTE — Telephone Encounter (Signed)
Patient notified. Letter sent to patient per requested.

## 2014-07-08 NOTE — Telephone Encounter (Signed)
Pt called checking on her lab results. Please advise at [phone]

## 2014-07-15 ENCOUNTER — Other Ambulatory Visit: Payer: Self-pay | Admitting: Family Medicine

## 2014-07-15 DIAGNOSIS — I10 Essential (primary) hypertension: Secondary | ICD-10-CM

## 2014-07-17 ENCOUNTER — Telehealth: Payer: Self-pay | Admitting: *Deleted

## 2014-07-17 NOTE — Telephone Encounter (Signed)
Unable to reach patient at time of Pre-Visit Call.  Left message for patient to return call when available.    

## 2014-07-18 ENCOUNTER — Ambulatory Visit: Payer: Commercial Managed Care - HMO | Admitting: Family Medicine

## 2014-07-18 DIAGNOSIS — Z0289 Encounter for other administrative examinations: Secondary | ICD-10-CM

## 2014-07-24 ENCOUNTER — Other Ambulatory Visit: Payer: Self-pay | Admitting: Physician Assistant

## 2014-07-24 ENCOUNTER — Telehealth: Payer: Self-pay | Admitting: Family Medicine

## 2014-07-24 MED ORDER — WALKER MISC: 1.0000 | Freq: Every day | Status: DC

## 2014-07-24 NOTE — Telephone Encounter (Signed)
Please let patient know that the scrip for a walker is ready for pick up.  Thank you!

## 2014-07-24 NOTE — Telephone Encounter (Signed)
Please write Rx for walker

## 2014-07-24 NOTE — Telephone Encounter (Signed)
Patient states that she needs a new walker.   [phone]

## 2014-07-25 NOTE — Telephone Encounter (Signed)
Pt notified. I will fax it to [phone].

## 2014-07-29 ENCOUNTER — Other Ambulatory Visit: Payer: Commercial Managed Care - HMO

## 2014-07-29 DIAGNOSIS — I10 Essential (primary) hypertension: Secondary | ICD-10-CM

## 2014-07-29 DIAGNOSIS — D649 Anemia, unspecified: Secondary | ICD-10-CM

## 2014-07-30 LAB — BASIC METABOLIC PANEL WITH GFR
BUN: 18 mg/dL (ref 6–23)
CALCIUM: 9.6 mg/dL (ref 8.4–10.5)
CO2: 24 mEq/L (ref 19–32)
CREATININE: 0.81 mg/dL (ref 0.50–1.10)
Chloride: 100 mEq/L (ref 96–112)
GFR, EST AFRICAN AMERICAN: 79 mL/min
GFR, EST NON AFRICAN AMERICAN: 69 mL/min
GLUCOSE: 97 mg/dL (ref 70–99)
Potassium: 4.6 mEq/L (ref 3.5–5.3)
Sodium: 136 mEq/L (ref 135–145)

## 2014-08-08 ENCOUNTER — Ambulatory Visit: Payer: Commercial Managed Care - HMO | Admitting: Family Medicine

## 2014-08-18 ENCOUNTER — Encounter: Payer: Self-pay | Admitting: Family Medicine

## 2014-08-25 ENCOUNTER — Telehealth: Payer: Self-pay

## 2014-08-25 DIAGNOSIS — L600 Ingrowing nail: Secondary | ICD-10-CM

## 2014-08-25 NOTE — Telephone Encounter (Signed)
Patient requesting a referral to a podiatrist for an ingrown toe nail. Patients call back number is [phone]

## 2014-08-26 NOTE — Telephone Encounter (Signed)
Can we refer? Should pt RTC first?

## 2014-08-26 NOTE — Telephone Encounter (Signed)
Possible [employer] referral.

## 2014-08-26 NOTE — Telephone Encounter (Signed)
Patients wants Dr. Linna Darner to know that her appointment at [facility] tomorrow at 2 and she would like to know if Dr. Linna Darner could call them and do a verbal referral. She also said she's very sorry.

## 2014-08-26 NOTE — Telephone Encounter (Signed)
Referral entered. Can let patient know.

## 2014-08-27 ENCOUNTER — Other Ambulatory Visit: Payer: Self-pay | Admitting: Physical Medicine and Rehabilitation

## 2014-08-27 DIAGNOSIS — M545 Low back pain: Secondary | ICD-10-CM

## 2014-08-27 NOTE — Telephone Encounter (Signed)
Patient called to thank Dr. Linna Darner for referral and to let him know she will see him on Friday morning. No call back needed.

## 2014-08-29 ENCOUNTER — Ambulatory Visit (INDEPENDENT_AMBULATORY_CARE_PROVIDER_SITE_OTHER): Payer: Commercial Managed Care - HMO | Admitting: Family Medicine

## 2014-08-29 VITALS — BP 132/60 | HR 68 | Temp 98.2°F | Resp 16 | Ht 64.0 in | Wt 225.4 lb

## 2014-08-29 DIAGNOSIS — I8312 Varicose veins of left lower extremity with inflammation: Secondary | ICD-10-CM | POA: Diagnosis not present

## 2014-08-29 DIAGNOSIS — B351 Tinea unguium: Secondary | ICD-10-CM

## 2014-08-29 DIAGNOSIS — I8311 Varicose veins of right lower extremity with inflammation: Secondary | ICD-10-CM

## 2014-08-29 DIAGNOSIS — M1990 Unspecified osteoarthritis, unspecified site: Secondary | ICD-10-CM | POA: Diagnosis not present

## 2014-08-29 DIAGNOSIS — I872 Venous insufficiency (chronic) (peripheral): Secondary | ICD-10-CM

## 2014-08-29 DIAGNOSIS — I10 Essential (primary) hypertension: Secondary | ICD-10-CM | POA: Diagnosis not present

## 2014-08-29 LAB — POC HEMOCCULT BLD/STL (HOME/3-CARD/SCREEN)
Card #2 Fecal Occult Blod, POC: NEGATIVE
Card #3 Fecal Occult Blood, POC: NEGATIVE
FECAL OCCULT BLD: NEGATIVE

## 2014-08-29 MED ORDER — TERBINAFINE HCL 250 MG PO TABS: 250.0000 mg | ORAL_TABLET | Freq: Every day | ORAL | Status: DC

## 2014-08-29 NOTE — Patient Instructions (Addendum)
Continue your current treatment. Use the cream on the legs.  Return at any time if needed, otherwise plan to return in about in October.  Podiatry referral is in progress  Take the terbinafine 250 mg one daily  Return in one month for a lab only visit to get your blood checked  Return to see me in 3 months

## 2014-08-29 NOTE — Progress Notes (Signed)
  Subjective:  Patient ID: Chelsea Browning, female    DOB: 04-25-1932  Age: 81 y.o. MRN: [id number]  Patient is here for a general follow-up. She has several things. She has problems with fungus of her toenails. She called in here for podiatry referral for ingrown toenails. She would like to change that referral to another podiatrist closer home.  Her stasis dermatitis of her legs has been doing well. She says she has a grim it for a few days, and it is a little redder today.   She does have problems with hip pain and pain in her back. She apparently is scheduled to be seen by some by Dr. Mardelle Matte referred her to to get a steroids injection in her back.  She is on medicine for blood pressure. She otherwise does well.   Objective:   Pleasant lady in no acute distress. Chest clear. Heart regular with a grade 2/6 systolic murmur left sternal border. Extremities have minimal edema. There is a little stasis dermatitis or edema of the legs, but no excoriations any longer. Her feet have significant onychomycosis of her toenails, not really ingrowing as much is just great big thick toenails.  Assessment & Plan:   Assessment: Stasis dermatitis History of hypertension DJD of hip Back pains Onychomycosis  Plan: Will treat the onychomycosis and she needs come in for a seem met one month after being on the terbinafine. She wanted to make sure I checked her electrolytes also which will be on that panel. Thank you for the banana bread No new prescriptions today except for the terbinafine 250 mg daily for one month Use the cream on her legs The specialist will take care of the injection in her back, reassured her about that. Patient Instructions  Continue your current treatment. Use the cream on the legs.  Return at any time if needed, otherwise plan to return in about in October.  Podiatry referral is in progress  Take the terbinafine 250 mg one daily  Return in one month for a lab only visit to  get your blood checked  Return to see me in 3 months    HOPPER,DAVID, MD 08/29/2014

## 2014-09-01 ENCOUNTER — Other Ambulatory Visit: Payer: Self-pay | Admitting: Physician Assistant

## 2014-09-11 ENCOUNTER — Other Ambulatory Visit: Payer: Commercial Managed Care - HMO

## 2014-09-14 ENCOUNTER — Ambulatory Visit (INDEPENDENT_AMBULATORY_CARE_PROVIDER_SITE_OTHER): Payer: Commercial Managed Care - HMO | Admitting: Family Medicine

## 2014-09-14 VITALS — BP 138/70 | HR 63 | Temp 98.0°F | Resp 18 | Ht 62.0 in | Wt 231.1 lb

## 2014-09-14 DIAGNOSIS — I872 Venous insufficiency (chronic) (peripheral): Secondary | ICD-10-CM

## 2014-09-14 DIAGNOSIS — D649 Anemia, unspecified: Secondary | ICD-10-CM | POA: Diagnosis not present

## 2014-09-14 DIAGNOSIS — R600 Localized edema: Secondary | ICD-10-CM | POA: Diagnosis not present

## 2014-09-14 DIAGNOSIS — L299 Pruritus, unspecified: Secondary | ICD-10-CM

## 2014-09-14 DIAGNOSIS — L03115 Cellulitis of right lower limb: Secondary | ICD-10-CM

## 2014-09-14 DIAGNOSIS — I8311 Varicose veins of right lower extremity with inflammation: Secondary | ICD-10-CM | POA: Diagnosis not present

## 2014-09-14 LAB — POCT CBC
Granulocyte percent: 71.3 %G (ref 37–80)
HCT, POC: 28.8 % — AB (ref 37.7–47.9)
Hemoglobin: 9.4 g/dL — AB (ref 12.2–16.2)
Lymph, poc: 1.1 (ref 0.6–3.4)
MCH, POC: 28.6 pg (ref 27–31.2)
MCHC: 32.5 g/dL (ref 31.8–35.4)
MCV: 88.1 fL (ref 80–97)
MID (CBC): 0.4 (ref 0–0.9)
MPV: 6.5 fL (ref 0–99.8)
POC Granulocyte: 3.8 (ref 2–6.9)
POC LYMPH PERCENT: 21.4 %L (ref 10–50)
POC MID %: 7.3 %M (ref 0–12)
Platelet Count, POC: 188 10*3/uL (ref 142–424)
RBC: 3.28 M/uL — AB (ref 4.04–5.48)
RDW, POC: 15.1 %
WBC: 5.3 10*3/uL (ref 4.6–10.2)

## 2014-09-14 LAB — GLUCOSE, POCT (MANUAL RESULT ENTRY): POC GLUCOSE: 107 mg/dL — AB (ref 70–99)

## 2014-09-14 MED ORDER — CEPHALEXIN 500 MG PO CAPS: 500.0000 mg | ORAL_CAPSULE | Freq: Four times a day (QID) | ORAL | Status: DC

## 2014-09-14 NOTE — Progress Notes (Signed)
Subjective:  Patient ID: Chelsea Browning, female    DOB: [birth year]  Age: 81 y.o. MRN: [id number]  4 days ago the patient had been outside. She thought she might have gotten better stung by something on the back of her right calf. At any rate she developed a diffuse cellulitis from the upper portion of her shin all the way down to the ankle. It is gotten red and itching and swollen much more. She has tried to not scratch it. She is taken some of the hydroxyzine to try and keep from itching is much. She continues to use her pump stockings. She cannot get her compression hose on today due to the swelling and discomfort. She has not had any systemic symptoms with this.  She does have a history of anemia on all her life   Objective:   Pleasant lady well-known to me. Right leg is bright red from about 6 inches below the knee down to the ankle. The back of the calf has a little 3 mm dark spot where she may have gotten a bite or sting or just some contusion. 4+ edema. Left leg looks fine.  Assessment & Plan:   Assessment:  Cellulitis in patient with history of stasis dermatitis and edema, possibly started by a sting or bite Anemia, probably anemia of chronic disease  Plan:  CBC  Results for orders placed or performed in visit on 09/14/14  POCT CBC  Result Value Ref Range   WBC 5.3 4.6 - 10.2 K/uL   Lymph, poc 1.1 0.6 - 3.4   POC LYMPH PERCENT 21.4 10 - 50 %L   MID (cbc) 0.4 0 - 0.9   POC MID % 7.3 0 - 12 %M   POC Granulocyte 3.8 2 - 6.9   Granulocyte percent 71.3 37 - 80 %G   RBC 3.28 (A) 4.04 - 5.48 M/uL   Hemoglobin 9.4 (A) 12.2 - 16.2 g/dL   HCT, POC 01.6 (A) 01.0 - 47.9 %   MCV 88.1 80 - 97 fL   MCH, POC 28.6 27 - 31.2 pg   MCHC 32.5 31.8 - 35.4 g/dL   RDW, POC 93.2 %   Platelet Count, POC 188 142 - 424 K/uL   MPV 6.5 0 - 99.8 fL  POCT glucose (manual entry)  Result Value Ref Range   POC Glucose 107 (A) 70 - 99 mg/dl      Patient Instructions  Take cephalexin 500 mg 3  times daily for infection of leg  Continue to use the compression pump on the leg  Elevate as much of the time as possible  Return in one week for recheck, sooner if concerns  Referral is being made to the hematology specialist     Christinea Brizuela, MD 09/14/2014

## 2014-09-14 NOTE — Patient Instructions (Signed)
Take cephalexin 500 mg 3 times daily for infection of leg  Continue to use the compression pump on the leg  Elevate as much of the time as possible  Return in one week for recheck, sooner if concerns  Referral is being made to the hematology specialist

## 2014-09-15 ENCOUNTER — Ambulatory Visit
Admission: RE | Admit: 2014-09-15 | Discharge: 2014-09-15 | Disposition: A | Discharge status: Home / Self Care | Payer: Commercial Managed Care - HMO | Source: Ambulatory Visit | Attending: Physical Medicine and Rehabilitation | Admitting: Physical Medicine and Rehabilitation

## 2014-09-15 DIAGNOSIS — M545 Low back pain: Secondary | ICD-10-CM

## 2014-09-18 ENCOUNTER — Telehealth: Payer: Self-pay | Admitting: Hematology

## 2014-09-18 NOTE — Telephone Encounter (Signed)
New patient appt-s/w patient and gave np appt for 08/17 @ 10:30 w/Dr. Irene Limbo.  Referring Dr. Linna Darner Dx-Anemia unspecified anemia type

## 2014-09-19 ENCOUNTER — Ambulatory Visit (INDEPENDENT_AMBULATORY_CARE_PROVIDER_SITE_OTHER): Payer: Commercial Managed Care - HMO | Admitting: Family Medicine

## 2014-09-19 VITALS — BP 138/70 | HR 73 | Temp 98.3°F | Resp 16 | Ht 62.0 in | Wt 231.0 lb

## 2014-09-19 DIAGNOSIS — R609 Edema, unspecified: Secondary | ICD-10-CM | POA: Diagnosis not present

## 2014-09-19 DIAGNOSIS — L299 Pruritus, unspecified: Secondary | ICD-10-CM | POA: Diagnosis not present

## 2014-09-19 DIAGNOSIS — I8312 Varicose veins of left lower extremity with inflammation: Secondary | ICD-10-CM

## 2014-09-19 DIAGNOSIS — I8311 Varicose veins of right lower extremity with inflammation: Secondary | ICD-10-CM | POA: Diagnosis not present

## 2014-09-19 DIAGNOSIS — S80821A Blister (nonthermal), right lower leg, initial encounter: Secondary | ICD-10-CM | POA: Diagnosis not present

## 2014-09-19 DIAGNOSIS — I872 Venous insufficiency (chronic) (peripheral): Secondary | ICD-10-CM

## 2014-09-19 MED ORDER — TRIAMCINOLONE 0.1 % CREAM:EUCERIN CREAM 1:1: TOPICAL_CREAM | CUTANEOUS | Status: DC

## 2014-09-19 NOTE — Progress Notes (Signed)
Subjective:  Patient ID: Chelsea Browning, female    DOB: Jul 22, 1932  Age: 81 y.o. MRN: [id number]  81 year old female patient well-known to me. She has taken the cephalexin over the past 5 days. The redness has diminished in her legs. They do continue to itch, making it more difficult to keep the stockings on.   Objective:   The right leg is less erythematous but it is actually more swollen I think that it was 5 days ago. There is a blister on the right shin about 8 mm in diameter. No ulcerations noted. The erythema is less confluent and more splotchy now. However the left leg has developed splotchy erythema on it which was not present a few days ago. It is also more swollen than it was.  Assessment & Plan:   Assessment:  Stasis dermatitis and cellulitis with blister formation of right shin Edema  Plan:  Emphasize the importance of getting the swelling down. Elevating legs. Keeping compression applied. Excessive salt.  Patient Instructions  Keep legs elevated more faithfully  Wear stockings as tolerated, as much as possible.  Continue using the ointment on the legs  Finish out the anabiotic course with the cephalexin  Return again in a week or so for a recheck, sooner if problems  Take caution if that blister pops, keep the area covered, try to avoid it becoming a bigger ulcer.      Jaleesa Cervi, MD 09/19/2014

## 2014-09-19 NOTE — Patient Instructions (Signed)
Keep legs elevated more faithfully  Wear stockings as tolerated, as much as possible.  Continue using the ointment on the legs  Finish out the anabiotic course with the cephalexin  Return again in a week or so for a recheck, sooner if problems  Take caution if that blister pops, keep the area covered, try to avoid it becoming a bigger ulcer.

## 2014-09-23 ENCOUNTER — Ambulatory Visit: Payer: Commercial Managed Care - HMO

## 2014-09-23 ENCOUNTER — Ambulatory Visit (INDEPENDENT_AMBULATORY_CARE_PROVIDER_SITE_OTHER): Payer: Commercial Managed Care - HMO | Admitting: Family Medicine

## 2014-09-23 ENCOUNTER — Ambulatory Visit (INDEPENDENT_AMBULATORY_CARE_PROVIDER_SITE_OTHER): Payer: Commercial Managed Care - HMO

## 2014-09-23 ENCOUNTER — Other Ambulatory Visit: Payer: Self-pay | Admitting: Family Medicine

## 2014-09-23 VITALS — BP 140/80 | HR 82 | Temp 98.5°F | Resp 16 | Ht 62.0 in | Wt 231.0 lb

## 2014-09-23 DIAGNOSIS — W19XXXA Unspecified fall, initial encounter: Secondary | ICD-10-CM

## 2014-09-23 DIAGNOSIS — M25559 Pain in unspecified hip: Secondary | ICD-10-CM

## 2014-09-23 DIAGNOSIS — R609 Edema, unspecified: Secondary | ICD-10-CM | POA: Diagnosis not present

## 2014-09-23 DIAGNOSIS — I8312 Varicose veins of left lower extremity with inflammation: Secondary | ICD-10-CM

## 2014-09-23 DIAGNOSIS — I8311 Varicose veins of right lower extremity with inflammation: Secondary | ICD-10-CM

## 2014-09-23 DIAGNOSIS — I872 Venous insufficiency (chronic) (peripheral): Secondary | ICD-10-CM

## 2014-09-23 DIAGNOSIS — L03115 Cellulitis of right lower limb: Secondary | ICD-10-CM

## 2014-09-23 NOTE — Patient Instructions (Signed)
Try to elevate legs frequently  Continue using the support hose  Take every precaution not to be scratching  It is okay future take your trip to [place], but use every opportunity to keep your legs elevated  Return in about 3 weeks if doing well, sooner if problems

## 2014-09-23 NOTE — Addendum Note (Signed)
Addended by: HOPPER, DAVID H on: 09/23/2014 04:35 PM   Modules accepted: Orders, Level of Service

## 2014-09-23 NOTE — Progress Notes (Signed)
Patient fell on sidewalk on exiting Subjective:  Patient ID: Chelsea Browning, female    DOB: June 05, 1932  Age: 81 y.o. MRN: [id number]  Patient was waiting for her ride and sat down on her walker seat. The wheeled walker ran away from her and she slid down to the sidewalk. She denies significant injury.   Objective:   Obese lady whom I just finished seeing. She was examined out on the sidewalk and then gotten up. She was brought into the office by wheelchair. She was reexamined standing and then up on the exam table. She has a little area of erythema on the right hip, probably where she landed on her water bottle. There is no real tenderness of the hips and fairly good range of motion. She is arthritic anyway. She did have some grimacing when she was standing. She is laydown exam table and range of motion of the hips is adequate bilaterally. No lesions noted on the lower legs.  Assessment & Plan:   Assessment:  Fall Hip pains, mild  Plan:  We'll go ahead and x-ray both hips  [facility] reading (PRIMARY) by  Dr. Linna Darner Severe DJD of right hip, significant arthritic and excess calcifications of left hip also. No acute fracture or injury noted. Pelvis looks intact. Will send for radiology read.  I think she will be sore from falling but no special care is needed. We will let her go home.Marland Kitchen     Amore Ackman, MD 09/23/2014

## 2014-09-23 NOTE — Progress Notes (Signed)
Subjective:  Patient ID: Chelsea Browning, female    DOB: 1933-01-23  Age: 81 y.o. MRN: [id number]  Patient has been trying to stay off of her feet. She has noticed significant improvement.   Objective:   Legs are still significantly swollen but the erythema is much much better. She has a couple of areas of superficial excoriation on both lateral calf areas. Actually she is scratch the left more than the right.  Assessment & Plan:   Assessment:  Cellulitis and stasis dermatitis, improved  Plan:  Needs to make your practice to get serious with staying off her feet whenever she starts noticing it getting worse  Patient Instructions  Try to elevate legs frequently  Continue using the support hose  Take every precaution not to be scratching  It is okay future take your trip to [place], but use every opportunity to keep your legs elevated  Return in about 3 weeks if doing well, sooner if problems     Cenia Zaragosa, MD 09/23/2014

## 2014-09-24 ENCOUNTER — Ambulatory Visit (HOSPITAL_BASED_OUTPATIENT_CLINIC_OR_DEPARTMENT_OTHER): Payer: Commercial Managed Care - HMO | Admitting: Hematology

## 2014-09-24 ENCOUNTER — Other Ambulatory Visit (HOSPITAL_BASED_OUTPATIENT_CLINIC_OR_DEPARTMENT_OTHER): Payer: Commercial Managed Care - HMO

## 2014-09-24 ENCOUNTER — Encounter: Payer: Self-pay | Admitting: Hematology

## 2014-09-24 ENCOUNTER — Ambulatory Visit: Payer: Commercial Managed Care - HMO

## 2014-09-24 ENCOUNTER — Telehealth: Payer: Self-pay | Admitting: Hematology

## 2014-09-24 VITALS — BP 161/62 | HR 64 | Temp 98.5°F | Resp 18 | Ht 62.0 in | Wt 229.1 lb

## 2014-09-24 DIAGNOSIS — B351 Tinea unguium: Secondary | ICD-10-CM | POA: Diagnosis not present

## 2014-09-24 DIAGNOSIS — D6489 Other specified anemias: Secondary | ICD-10-CM

## 2014-09-24 DIAGNOSIS — D649 Anemia, unspecified: Secondary | ICD-10-CM

## 2014-09-24 DIAGNOSIS — I872 Venous insufficiency (chronic) (peripheral): Secondary | ICD-10-CM

## 2014-09-24 DIAGNOSIS — I890 Lymphedema, not elsewhere classified: Secondary | ICD-10-CM | POA: Diagnosis not present

## 2014-09-24 DIAGNOSIS — R600 Localized edema: Secondary | ICD-10-CM

## 2014-09-24 LAB — CBC & DIFF AND RETIC
BASO%: 0.4 % (ref 0.0–2.0)
Basophils Absolute: 0 10*3/uL (ref 0.0–0.1)
EOS%: 9.3 % — AB (ref 0.0–7.0)
Eosinophils Absolute: 0.5 10*3/uL (ref 0.0–0.5)
HCT: 29 % — ABNORMAL LOW (ref 34.8–46.6)
HGB: 9.6 g/dL — ABNORMAL LOW (ref 11.6–15.9)
Immature Retic Fract: 12.5 % — ABNORMAL HIGH (ref 1.60–10.00)
LYMPH%: 18.8 % (ref 14.0–49.7)
MCH: 29.7 pg (ref 25.1–34.0)
MCHC: 33.1 g/dL (ref 31.5–36.0)
MCV: 89.8 fL (ref 79.5–101.0)
MONO#: 0.4 10*3/uL (ref 0.1–0.9)
MONO%: 8.7 % (ref 0.0–14.0)
NEUT%: 62.8 % (ref 38.4–76.8)
NEUTROS ABS: 3.1 10*3/uL (ref 1.5–6.5)
Platelets: 190 10*3/uL (ref 145–400)
RBC: 3.23 10*6/uL — AB (ref 3.70–5.45)
RDW: 14.7 % — AB (ref 11.2–14.5)
RETIC %: 2 % (ref 0.70–2.10)
RETIC CT ABS: 64.6 10*3/uL (ref 33.70–90.70)
WBC: 4.9 10*3/uL (ref 3.9–10.3)
lymph#: 0.9 10*3/uL (ref 0.9–3.3)

## 2014-09-24 LAB — COMPREHENSIVE METABOLIC PANEL (CC13)
ALT: 15 U/L (ref 0–55)
AST: 19 U/L (ref 5–34)
Albumin: 3.9 g/dL (ref 3.5–5.0)
Alkaline Phosphatase: 122 U/L (ref 40–150)
Anion Gap: 9 mEq/L (ref 3–11)
BUN: 20.2 mg/dL (ref 7.0–26.0)
CHLORIDE: 103 meq/L (ref 98–109)
CO2: 24 meq/L (ref 22–29)
CREATININE: 0.8 mg/dL (ref 0.6–1.1)
Calcium: 9.3 mg/dL (ref 8.4–10.4)
EGFR: 66 mL/min/{1.73_m2} — ABNORMAL LOW (ref >90)
Glucose: 94 mg/dl (ref 70–140)
POTASSIUM: 4.1 meq/L (ref 3.5–5.1)
SODIUM: 137 meq/L (ref 136–145)
Total Bilirubin: 0.55 mg/dL (ref 0.20–1.20)
Total Protein: 6.5 g/dL (ref 6.4–8.3)

## 2014-09-24 LAB — CHCC SMEAR

## 2014-09-24 LAB — TSH CHCC: TSH: 1.335 m[IU]/L (ref 0.308–3.960)

## 2014-09-24 LAB — LACTATE DEHYDROGENASE (CC13): LDH: 235 U/L (ref 125–245)

## 2014-09-24 NOTE — Progress Notes (Signed)
Checked in new patient for blood concern. Pt may not need assistance but can call should she have any questions or concerns.

## 2014-09-24 NOTE — Telephone Encounter (Signed)
Pt confirmed labs/ov per 08/17 POF, gave pt avs and calendar.Marland Kitchen kj
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
## 2014-09-25 ENCOUNTER — Other Ambulatory Visit: Payer: Self-pay | Admitting: Internal Medicine

## 2014-09-25 ENCOUNTER — Other Ambulatory Visit: Payer: Self-pay | Admitting: Physician Assistant

## 2014-09-25 NOTE — Progress Notes (Signed)
           Peripheral blood smear:   Patient has multiple acanthocytes, macro ovalocytes, a dimorphic population of red blood cell sizes, giant platelets, hypogranular platelets and WBCs with hypo-granulation and abnormal nuclear features.  Some tendency to rouleaux formation noted.  Overall picture is suggestive of some element of myelodysplastic syndrome and possible chronic inflammation.  Patient's inflammation markers are not significantly elevated at this time.  Plan -Follow-up on SPEP/IFE results -Will discuss with patient about possible bone marrow biopsy if so inclined.  Sullivan Lone MD MS Hematology/Oncology Physician [facility]

## 2014-09-26 ENCOUNTER — Telehealth: Payer: Self-pay | Admitting: Family Medicine

## 2014-09-26 LAB — SPEP & IFE WITH QIG
ALBUMIN ELP: 3.8 g/dL (ref 3.8–4.8)
ALPHA-1-GLOBULIN: 0.4 g/dL — AB (ref 0.2–0.3)
Alpha-2-Globulin: 0.7 g/dL (ref 0.5–0.9)
BETA 2: 0.3 g/dL (ref 0.2–0.5)
Beta Globulin: 0.5 g/dL (ref 0.4–0.6)
GAMMA GLOBULIN: 0.5 g/dL — AB (ref 0.8–1.7)
IGA: 83 mg/dL (ref 69–380)
IGM, SERUM: 16 mg/dL — AB (ref 52–322)
IgG (Immunoglobin G), Serum: 488 mg/dL — ABNORMAL LOW (ref 690–1700)
Total Protein, Serum Electrophoresis: 6.2 g/dL (ref 6.1–8.1)

## 2014-09-26 LAB — KAPPA/LAMBDA LIGHT CHAINS
KAPPA FREE LGHT CHN: 2.16 mg/dL — AB (ref 0.33–1.94)
Kappa:Lambda Ratio: 2.12 — ABNORMAL HIGH (ref 0.26–1.65)
LAMBDA FREE LGHT CHN: 1.02 mg/dL (ref 0.57–2.63)

## 2014-09-26 LAB — ERYTHROPOIETIN: Erythropoietin: 28.6 m[IU]/mL — ABNORMAL HIGH (ref 2.6–18.5)

## 2014-09-26 LAB — C-REACTIVE PROTEIN: CRP: 0.5 mg/dL (ref <0.60)

## 2014-09-26 LAB — HAPTOGLOBIN: HAPTOGLOBIN: 176 mg/dL (ref 43–212)

## 2014-09-26 LAB — SEDIMENTATION RATE: Sed Rate: 15 mm/hr (ref 0–30)

## 2014-09-26 NOTE — Telephone Encounter (Signed)
Pt called to just to say thank you so much for the extra time and care that you provided last Tuesday (09/30/14).  "Thank you so much Dr. Leim Fabry.  [phone]

## 2014-10-03 ENCOUNTER — Telehealth: Payer: Self-pay | Admitting: Hematology

## 2014-10-03 ENCOUNTER — Ambulatory Visit: Payer: Commercial Managed Care - HMO | Admitting: Family Medicine

## 2014-10-03 NOTE — Telephone Encounter (Signed)
pt called to confirm appt....pt ok and aware °

## 2014-10-07 ENCOUNTER — Ambulatory Visit (INDEPENDENT_AMBULATORY_CARE_PROVIDER_SITE_OTHER): Payer: Commercial Managed Care - HMO | Admitting: Family Medicine

## 2014-10-07 ENCOUNTER — Encounter: Payer: Commercial Managed Care - HMO | Admitting: Hematology

## 2014-10-07 ENCOUNTER — Encounter: Payer: Self-pay | Admitting: Family Medicine

## 2014-10-07 VITALS — BP 130/64 | HR 66 | Temp 98.0°F | Ht 66.0 in | Wt 224.4 lb

## 2014-10-07 DIAGNOSIS — Z0000 Encounter for general adult medical examination without abnormal findings: Secondary | ICD-10-CM | POA: Diagnosis not present

## 2014-10-07 DIAGNOSIS — Z23 Encounter for immunization: Secondary | ICD-10-CM | POA: Diagnosis not present

## 2014-10-07 DIAGNOSIS — I872 Venous insufficiency (chronic) (peripheral): Secondary | ICD-10-CM

## 2014-10-07 DIAGNOSIS — E785 Hyperlipidemia, unspecified: Secondary | ICD-10-CM

## 2014-10-07 DIAGNOSIS — I8311 Varicose veins of right lower extremity with inflammation: Secondary | ICD-10-CM

## 2014-10-07 DIAGNOSIS — I8312 Varicose veins of left lower extremity with inflammation: Secondary | ICD-10-CM

## 2014-10-07 DIAGNOSIS — K449 Diaphragmatic hernia without obstruction or gangrene: Secondary | ICD-10-CM

## 2014-10-07 DIAGNOSIS — K219 Gastro-esophageal reflux disease without esophagitis: Secondary | ICD-10-CM

## 2014-10-07 DIAGNOSIS — Z8619 Personal history of other infectious and parasitic diseases: Secondary | ICD-10-CM | POA: Diagnosis not present

## 2014-10-07 DIAGNOSIS — E669 Obesity, unspecified: Secondary | ICD-10-CM

## 2014-10-07 DIAGNOSIS — R600 Localized edema: Secondary | ICD-10-CM

## 2014-10-07 DIAGNOSIS — I10 Essential (primary) hypertension: Secondary | ICD-10-CM

## 2014-10-07 HISTORY — DX: Personal history of other infectious and parasitic diseases: Z86.19

## 2014-10-07 NOTE — Patient Instructions (Signed)

## 2014-10-07 NOTE — Assessment & Plan Note (Signed)
Improved with compression hose and unna boots and a compression device in the home. Is much better, only one episode of cellulitis

## 2014-10-07 NOTE — Assessment & Plan Note (Signed)
Had a fall a week ago on to her right hip while leaving a doctor's appt. No fracture after exercising, pain is slowly improving. Using Hydrocodone sparingly with some relief. Had a course of PT earlier in the year. Is now able to use the stairs carefully.

## 2014-10-07 NOTE — Progress Notes (Signed)
Pre visit review using our clinic review tool, if applicable. No additional management support is needed unless otherwise documented below in the visit note. 

## 2014-10-07 NOTE — Assessment & Plan Note (Signed)
Encouraged DASH diet, decrease po intake and increase exercise as tolerated. Needs 7-8 hours of sleep nightly. Avoid trans fats, eat small, frequent meals every 4-5 hours with lean proteins, complex carbs and healthy fats. Minimize simple carbs, GMO foods. 

## 2014-10-08 NOTE — Progress Notes (Signed)
This encounter was created in error - please disregard.

## 2014-10-10 ENCOUNTER — Telehealth: Payer: Self-pay | Admitting: Hematology

## 2014-10-10 NOTE — Telephone Encounter (Signed)
I called Ms Chelsea Browning on her home phone number and her cell phone number to discuss her blood test results and to follow-up on her since she missed her clinic appointment. I left a message for her to call back to clinic. Patient to continue following with primary care physician. If she gets more anemic would consider bone marrow biopsy and/or starting her on Aranesp (ESA's). If she has recurrent cellulitis given somewhat low IgG levels less than 500 one might consider IVIG to maintain levels more than 500. She has other local risk factors for recurrent infections of her lower extremities.  Sullivan Lone MD [place] AAHIVMS [facility] Hematology/Oncology Physician [place]  (Office):       [phone] (Work cell):  [phone] (Fax):           [phone]

## 2014-10-14 ENCOUNTER — Other Ambulatory Visit: Payer: Self-pay | Admitting: *Deleted

## 2014-10-14 ENCOUNTER — Telehealth: Payer: Self-pay | Admitting: Hematology

## 2014-10-14 NOTE — Telephone Encounter (Signed)
pt phones would not go through...mailed pt appt sched and letter

## 2014-10-14 NOTE — Telephone Encounter (Signed)
no ring tone....mailed pt appt sched and letter

## 2014-10-15 ENCOUNTER — Telehealth: Payer: Self-pay

## 2014-10-15 NOTE — Telephone Encounter (Signed)
Patient's daughter called to request a referral for PT at [facility].  She said the patient fell after her last OV here, and she is having more difficulty walking as time goes on.  Her daughter thinks she would benefit from PT.  Please advise.  Thank you.  CB#: [phone].

## 2014-10-16 NOTE — Telephone Encounter (Signed)
Advised pt to RTC. Pt will come in.

## 2014-10-17 ENCOUNTER — Ambulatory Visit (INDEPENDENT_AMBULATORY_CARE_PROVIDER_SITE_OTHER): Payer: Commercial Managed Care - HMO | Admitting: Family Medicine

## 2014-10-17 VITALS — BP 132/66 | HR 62 | Temp 98.1°F | Resp 18 | Ht 66.0 in | Wt 224.0 lb

## 2014-10-17 DIAGNOSIS — M25551 Pain in right hip: Secondary | ICD-10-CM

## 2014-10-17 DIAGNOSIS — M1990 Unspecified osteoarthritis, unspecified site: Secondary | ICD-10-CM

## 2014-10-17 DIAGNOSIS — M7061 Trochanteric bursitis, right hip: Secondary | ICD-10-CM | POA: Diagnosis not present

## 2014-10-17 DIAGNOSIS — I8311 Varicose veins of right lower extremity with inflammation: Secondary | ICD-10-CM | POA: Diagnosis not present

## 2014-10-17 DIAGNOSIS — I872 Venous insufficiency (chronic) (peripheral): Secondary | ICD-10-CM

## 2014-10-17 DIAGNOSIS — I8312 Varicose veins of left lower extremity with inflammation: Secondary | ICD-10-CM

## 2014-10-17 MED ORDER — TRAMADOL HCL 50 MG PO TABS: 50.0000 mg | ORAL_TABLET | Freq: Three times a day (TID) | ORAL | Status: DC | PRN

## 2014-10-17 NOTE — Progress Notes (Signed)
Right hip pain Subjective:  Patient ID: Chelsea Browning, female    DOB: [birth year]  Age: 81 y.o. MRN: [id number]  Patient is here for recheck with regard to the right hip. It has been hurting her since she fell. She had the pre-existing bad arthritis in the hip, but it is been fairly stable until she fell on that hip outside the office here when she was leaving last time. She has been taking care of her legs and they're doing better in the swelling and rash. The pain is in the lateral aspect of the right hip. She's been taking Tylenol 2 tablets 3 times a day.   Objective:   Stasis dermatitis of the ankles looks much better with decreased edema. She is tender in the right hip, but it seems more out at the greater trochanter area. I reviewed the x-ray reports which confirm the severe degenerative joint disease of that hip without any acute fracture noted.  Assessment & Plan:   Assessment:  Right hip pain Probable trochanteric bursitis DJD right hip Stasis dermatitis improved  Plan:  If she doesn't do better with some pain medicine on little more time we will try a cortisone injection into the greater trochanter. Continue current care for her ankles.  Patient Instructions  Continue taking Tylenol 2 pills 3 times daily as needed  For worse pain take tramadol 1 pill every 6 or 8 hours in addition to the Tylenol  If you continue having a lot of pain we can consider trying to inject the greater trochanteric bursa with some cortisone  Continue taking good care of the stasis dermatitis of your lower legs which is doing good today.    Caulder Wehner, MD 10/17/2014

## 2014-10-17 NOTE — Patient Instructions (Signed)
Continue taking Tylenol 2 pills 3 times daily as needed  For worse pain take tramadol 1 pill every 6 or 8 hours in addition to the Tylenol  If you continue having a lot of pain we can consider trying to inject the greater trochanteric bursa with some cortisone  Continue taking good care of the stasis dermatitis of your lower legs which is doing good today.

## 2014-10-21 ENCOUNTER — Telehealth: Payer: Self-pay | Admitting: Hematology

## 2014-10-21 ENCOUNTER — Ambulatory Visit: Payer: Commercial Managed Care - HMO | Admitting: Hematology

## 2014-10-21 NOTE — Telephone Encounter (Signed)
pt spouse cld stating just geting reminder in mail for appotment today-r/s app for 9/14 gave time & date

## 2014-10-21 NOTE — Telephone Encounter (Signed)
Patient missed her second scheduled appointment today. Called her for the third time on her phone and was not able to get through to her. Left a message on her personal voicemail on her cell phone regarding calling the clinic if she had any questions and continuing her follow-up with her primary care physician.  Will defer to primary care physician regarding reconsulting [place] if any further need arises.  Patient appears to have anemia due to anemia of chronic disease with likely myelodysplastic syndrome. Plan -If getting more anemic would consider a bone marrow examination or alternatively given that her erythropoietin levels are reasonably low might consider ESA treatment.  Continue follow-up with primary care physician will not schedule a follow-up with hematology at this time.  Sullivan Lone MD [place] AAHIVMS [facility] Hematology/Oncology Physician [place]  (Office):       [phone] (Work cell):  [phone] (Fax):           [phone]

## 2014-10-21 NOTE — Telephone Encounter (Signed)
Please check with patient's daughter and see if they knew about these hematology visits? If they want them rescheduled. I reviewed the note from hematology and they had wanted a follow up visit in 2 weeks after the initial visit. If they want to follow up with hematology they should call over there and reschedule or let us know and we can help.

## 2014-10-22 ENCOUNTER — Telehealth: Payer: Self-pay | Admitting: Hematology

## 2014-10-22 ENCOUNTER — Encounter: Payer: Self-pay | Admitting: Hematology

## 2014-10-22 ENCOUNTER — Ambulatory Visit (HOSPITAL_BASED_OUTPATIENT_CLINIC_OR_DEPARTMENT_OTHER): Payer: Commercial Managed Care - HMO

## 2014-10-22 ENCOUNTER — Ambulatory Visit (HOSPITAL_BASED_OUTPATIENT_CLINIC_OR_DEPARTMENT_OTHER): Payer: Commercial Managed Care - HMO | Admitting: Hematology

## 2014-10-22 VITALS — BP 177/42 | HR 71 | Temp 98.7°F | Resp 18 | Ht 66.0 in | Wt 214.9 lb

## 2014-10-22 DIAGNOSIS — D509 Iron deficiency anemia, unspecified: Secondary | ICD-10-CM

## 2014-10-22 DIAGNOSIS — D469 Myelodysplastic syndrome, unspecified: Secondary | ICD-10-CM | POA: Diagnosis not present

## 2014-10-22 DIAGNOSIS — D6489 Other specified anemias: Secondary | ICD-10-CM | POA: Diagnosis not present

## 2014-10-22 DIAGNOSIS — D649 Anemia, unspecified: Secondary | ICD-10-CM | POA: Diagnosis not present

## 2014-10-22 LAB — CBC & DIFF AND RETIC
BASO%: 0.2 % (ref 0.0–2.0)
BASOS ABS: 0 10*3/uL (ref 0.0–0.1)
EOS%: 4.6 % (ref 0.0–7.0)
Eosinophils Absolute: 0.3 10*3/uL (ref 0.0–0.5)
HCT: 34.1 % — ABNORMAL LOW (ref 34.8–46.6)
HGB: 11.4 g/dL — ABNORMAL LOW (ref 11.6–15.9)
IMMATURE RETIC FRACT: 8 % (ref 1.60–10.00)
LYMPH%: 21.9 % (ref 14.0–49.7)
MCH: 30 pg (ref 25.1–34.0)
MCHC: 33.4 g/dL (ref 31.5–36.0)
MCV: 89.7 fL (ref 79.5–101.0)
MONO#: 0.5 10*3/uL (ref 0.1–0.9)
MONO%: 7.9 % (ref 0.0–14.0)
NEUT%: 65.4 % (ref 38.4–76.8)
NEUTROS ABS: 3.8 10*3/uL (ref 1.5–6.5)
Platelets: 214 10*3/uL (ref 145–400)
RBC: 3.8 10*6/uL (ref 3.70–5.45)
RDW: 14.2 % (ref 11.2–14.5)
RETIC %: 1.59 % (ref 0.70–2.10)
Retic Ct Abs: 60.42 10*3/uL (ref 33.70–90.70)
WBC: 5.8 10*3/uL (ref 3.9–10.3)
lymph#: 1.3 10*3/uL (ref 0.9–3.3)

## 2014-10-22 LAB — COMPREHENSIVE METABOLIC PANEL (CC13)
ALK PHOS: 139 U/L (ref 40–150)
ALT: 11 U/L (ref 0–55)
AST: 19 U/L (ref 5–34)
Albumin: 4.3 g/dL (ref 3.5–5.0)
Anion Gap: 9 mEq/L (ref 3–11)
BILIRUBIN TOTAL: 0.59 mg/dL (ref 0.20–1.20)
BUN: 22.7 mg/dL (ref 7.0–26.0)
CALCIUM: 10.1 mg/dL (ref 8.4–10.4)
CO2: 25 mEq/L (ref 22–29)
Chloride: 105 mEq/L (ref 98–109)
Creatinine: 0.9 mg/dL (ref 0.6–1.1)
EGFR: 59 mL/min/{1.73_m2} — ABNORMAL LOW (ref >90)
GLUCOSE: 97 mg/dL (ref 70–140)
Potassium: 4.4 mEq/L (ref 3.5–5.1)
SODIUM: 139 meq/L (ref 136–145)
TOTAL PROTEIN: 7.1 g/dL (ref 6.4–8.3)

## 2014-10-22 LAB — VITAMIN B12: VITAMIN B 12: 378 pg/mL (ref 211–911)

## 2014-10-22 NOTE — Telephone Encounter (Signed)
Pt confirmed labs/ov per 09/14 POF.... Gave pt AVS and Calendar... KJ, sent msg to add IV Feraheme

## 2014-10-22 NOTE — Telephone Encounter (Signed)
Left message for daughter to return my call

## 2014-10-23 ENCOUNTER — Telehealth: Payer: Self-pay | Admitting: Hematology

## 2014-10-23 ENCOUNTER — Telehealth: Payer: Self-pay | Admitting: Family Medicine

## 2014-10-23 LAB — IRON AND TIBC CHCC
%SAT: 21 % (ref 21–57)
IRON: 79 ug/dL (ref 41–142)
TIBC: 371 ug/dL (ref 236–444)
UIBC: 292 ug/dL (ref 120–384)

## 2014-10-23 LAB — FERRITIN CHCC: FERRITIN: 35 ng/mL (ref 9–269)

## 2014-10-23 NOTE — Telephone Encounter (Signed)
Added Feraheme to schedule, s/w pt confirming D/T .Marland Kitchen.. KJ

## 2014-10-23 NOTE — Telephone Encounter (Signed)
Left second message for patients daughter to return call.

## 2014-10-23 NOTE — Telephone Encounter (Signed)
Caller name: Tammy ([place])  Relationship to patient: Physical Therapist Can be reached: [phone] leave vm if no answer .Marland Kitchen  Fax [phone] Pharmacy:  Reason for call: Lynelle Smoke is calling in because she states that the pt fell again and now needs more Therapy. She says that she needs a new Rx . If you could fax to her at the provided fax number.    Please advise.

## 2014-10-24 ENCOUNTER — Other Ambulatory Visit: Payer: Self-pay | Admitting: Hematology

## 2014-10-24 ENCOUNTER — Ambulatory Visit (HOSPITAL_BASED_OUTPATIENT_CLINIC_OR_DEPARTMENT_OTHER): Payer: Commercial Managed Care - HMO

## 2014-10-24 ENCOUNTER — Other Ambulatory Visit: Payer: Self-pay | Admitting: *Deleted

## 2014-10-24 VITALS — BP 117/49 | HR 60 | Temp 98.1°F | Resp 20

## 2014-10-24 DIAGNOSIS — D649 Anemia, unspecified: Secondary | ICD-10-CM | POA: Diagnosis not present

## 2014-10-24 MED ORDER — SODIUM CHLORIDE 0.9 % IJ SOLN
3.0000 mL | Freq: Once | INTRAMUSCULAR | Status: DC | PRN
  Filled 2014-10-24: qty 10

## 2014-10-24 MED ORDER — SODIUM CHLORIDE 0.9 % IJ SOLN
10.0000 mL | INTRAMUSCULAR | Status: DC | PRN
  Filled 2014-10-24: qty 10

## 2014-10-24 MED ORDER — SODIUM CHLORIDE 0.9 % IV SOLN
Freq: Once | INTRAVENOUS | Status: AC
  Administered 2014-10-24: 13:00:00 via INTRAVENOUS

## 2014-10-24 MED ORDER — SODIUM CHLORIDE 0.9 % IV SOLN
510.0000 mg | Freq: Once | INTRAVENOUS | Status: AC
  Administered 2014-10-24: 510 mg via INTRAVENOUS
  Filled 2014-10-24: qty 17

## 2014-10-24 MED ORDER — HEPARIN SOD (PORK) LOCK FLUSH 100 UNIT/ML IV SOLN
250.0000 [IU] | Freq: Once | INTRAVENOUS | Status: DC | PRN
  Filled 2014-10-24: qty 5

## 2014-10-24 MED ORDER — HEPARIN SOD (PORK) LOCK FLUSH 100 UNIT/ML IV SOLN
500.0000 [IU] | Freq: Once | INTRAVENOUS | Status: DC | PRN
  Filled 2014-10-24: qty 5

## 2014-10-24 MED ORDER — ALTEPLASE 2 MG IJ SOLR
2.0000 mg | Freq: Once | INTRAMUSCULAR | Status: DC | PRN
  Filled 2014-10-24: qty 2

## 2014-10-24 NOTE — Telephone Encounter (Signed)
Daughter called back. States they did not miss appointments, there was just a mix up with scheduling.

## 2014-10-24 NOTE — Patient Instructions (Signed)
Ferumoxytol injection What is this medicine? FERUMOXYTOL is an iron complex. Iron is used to make healthy red blood cells, which carry oxygen and nutrients throughout the body. This medicine is used to treat iron deficiency anemia in people with chronic kidney disease. This medicine may be used for other purposes; ask your health care Tamas Suen or pharmacist if you have questions. COMMON BRAND NAME(S): Feraheme What should I tell my health care Jenice Leiner before I take this medicine? They need to know if you have any of these conditions: -anemia not caused by low iron levels -high levels of iron in the blood -magnetic resonance imaging (MRI) test scheduled -an unusual or allergic reaction to iron, other medicines, foods, dyes, or preservatives -pregnant or trying to get pregnant -breast-feeding How should I use this medicine? This medicine is for injection into a vein. It is given by a health care professional in a hospital or clinic setting. Talk to your pediatrician regarding the use of this medicine in children. Special care may be needed. Overdosage: If you think you've taken too much of this medicine contact a poison control center or emergency room at once. Overdosage: If you think you have taken too much of this medicine contact a poison control center or emergency room at once. NOTE: This medicine is only for you. Do not share this medicine with others. What if I miss a dose? It is important not to miss your dose. Call your doctor or health care professional if you are unable to keep an appointment. What may interact with this medicine? This medicine may interact with the following medications: -other iron products This list may not describe all possible interactions. Give your health care Neftaly Inzunza a list of all the medicines, herbs, non-prescription drugs, or dietary supplements you use. Also tell them if you smoke, drink alcohol, or use illegal drugs. Some items may interact with your  medicine. What should I watch for while using this medicine? Visit your doctor or healthcare professional regularly. Tell your doctor or healthcare professional if your symptoms do not start to get better or if they get worse. You may need blood work done while you are taking this medicine. You may need to follow a special diet. Talk to your doctor. Foods that contain iron include: whole grains/cereals, dried fruits, beans, or peas, leafy green vegetables, and organ meats (liver, kidney). What side effects may I notice from receiving this medicine? Side effects that you should report to your doctor or health care professional as soon as possible: -allergic reactions like skin rash, itching or hives, swelling of the face, lips, or tongue -breathing problems -changes in blood pressure -feeling faint or lightheaded, falls -fever or chills -flushing, sweating, or hot feelings -swelling of the ankles or feet Side effects that usually do not require medical attention (Report these to your doctor or health care professional if they continue or are bothersome.): -diarrhea -headache -nausea, vomiting -stomach pain This list may not describe all possible side effects. Call your doctor for medical advice about side effects. You may report side effects to FDA at 1-800-FDA-1088. Where should I keep my medicine? This drug is given in a hospital or clinic and will not be stored at home. NOTE: This sheet is a summary. It may not cover all possible information. If you have questions about this medicine, talk to your doctor, pharmacist, or health care Shyann Hefner.  2015, Elsevier/Gold Standard. (2011-09-09 15:23:36)  

## 2014-10-24 NOTE — Telephone Encounter (Signed)
Please give order for continued PT per request. For recurrent falls and weaknes

## 2014-10-25 NOTE — Telephone Encounter (Signed)
Noted  
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
## 2014-10-26 NOTE — Assessment & Plan Note (Signed)
Avoid offending foods. Do not eat large meals in late evening and consider raising head of bed.  

## 2014-10-26 NOTE — Assessment & Plan Note (Signed)
Tolerating statin, encouraged heart healthy diet, avoid trans fats, minimize simple carbs and saturated fats. Increase exercise as tolerated 

## 2014-10-26 NOTE — Assessment & Plan Note (Signed)
Improved with daily use of compresion device, minimize sodium and elevate feet bid for roughly 15 minutes

## 2014-10-26 NOTE — Assessment & Plan Note (Signed)
Well controlled, no changes to meds. Encouraged heart healthy diet such as the DASH diet and exercise as tolerated.  °

## 2014-10-28 ENCOUNTER — Encounter: Payer: Self-pay | Admitting: Family Medicine

## 2014-10-28 ENCOUNTER — Ambulatory Visit (INDEPENDENT_AMBULATORY_CARE_PROVIDER_SITE_OTHER): Payer: Commercial Managed Care - HMO | Admitting: Family Medicine

## 2014-10-28 VITALS — BP 142/60 | HR 71 | Temp 98.4°F | Resp 18 | Ht 66.0 in | Wt 214.8 lb

## 2014-10-28 DIAGNOSIS — IMO0001 Reserved for inherently not codable concepts without codable children: Secondary | ICD-10-CM

## 2014-10-28 DIAGNOSIS — D649 Anemia, unspecified: Secondary | ICD-10-CM | POA: Diagnosis not present

## 2014-10-28 DIAGNOSIS — R030 Elevated blood-pressure reading, without diagnosis of hypertension: Secondary | ICD-10-CM | POA: Diagnosis not present

## 2014-10-28 DIAGNOSIS — M25551 Pain in right hip: Secondary | ICD-10-CM | POA: Diagnosis not present

## 2014-10-28 MED ORDER — TRAMADOL HCL 50 MG PO TABS: 50.0000 mg | ORAL_TABLET | Freq: Three times a day (TID) | ORAL | Status: DC | PRN

## 2014-10-28 NOTE — Progress Notes (Signed)
Patient ID: Chelsea Browning, female    DOB: Oct 26, 1932  Age: 81 y.o. MRN: [id number]  Chief Complaint  Patient presents with  . Follow-up    Rt Hip    Subjective:   Patient is here for a follow-up of her hip pain. She would like a cortisone shot in it. She continues to hurt quite a lot. She is walking with difficulty with her walker. She saw the hematologist the other day and got her first infusion. She is scheduled to go back for another one. She had some questions about that which we discussed. She also had an elevated blood pressure when she came in today and was concerned about that.  Current allergies, medications, problem list, past/family and social histories reviewed.  Objective:  BP 142/60 mmHg  Pulse 71  Temp(Src) 98.4 F (36.9 C) (Oral)  Resp 18  Ht 5\' 6"  (1.676 m)  Wt 214 lb 12.8 oz (97.433 kg)  BMI 34.69 kg/m2  SpO2 92%  Obese, pleasant lady in no acute distress. When she walks she hurts quite a lot and right hip. It is still tender. Blood pressure was repeated and was better.  Assessment & Plan:   Assessment: 1. Right hip pain   2. Anemia, unspecified anemia type   3. Elevated blood pressure       Plan: Orders Placed This Encounter  Procedures  . Ambulatory referral to Orthopedic Surgery    Referral Priority:  Routine    Referral Type:  Surgical    Referral Reason:  Specialty Services Required    Referred to Provider:  Marchia Bond, MD    Requested Specialty:  Orthopedic Surgery    Number of Visits Requested:  1    Meds ordered this encounter  Medications  . traMADol (ULTRAM) 50 MG tablet    Sig: Take 1 tablet (50 mg total) by mouth every 8 (eight) hours as needed.    Dispense:  30 tablet    Refill:  0     I feel like she should see her orthopedic doctor back to consider what else to do for the hip. I thought about getting in the hip re-x-rayed, but decided it was not necessary at this time. I declined giving cortisone today. He might be able to  repeat the epidural or give an intra-articular injection for her.  Reassurance about the blood pressure  Continue getting her treatment for the anemia.  She wants to see me back before go [place], which [place] from tomorrow.     Patient Instructions  Continue the tramadol  Go back to see Dr. Mardelle Matte to recheck the hip      Return in about 4 weeks (around 11/25/2014).   HOPPER,DAVID, MD 10/28/2014

## 2014-10-28 NOTE — Patient Instructions (Addendum)
Continue the tramadol  Go back to see Dr. Mardelle Matte to recheck the hip

## 2014-10-29 NOTE — Telephone Encounter (Signed)
Left a voicemail for continued therapy.

## 2014-10-30 ENCOUNTER — Telehealth: Payer: Self-pay

## 2014-10-30 NOTE — Telephone Encounter (Signed)
Can we send order?

## 2014-10-30 NOTE — Telephone Encounter (Signed)
Please fax referral to [facility]. Fax#: [phone] Orvan Seen. Thank you.

## 2014-10-30 NOTE — Telephone Encounter (Signed)
Patient's daughter called to ask if we could fax a new order to [facility].  Fax: [phone] Orvan Seen.

## 2014-10-31 ENCOUNTER — Other Ambulatory Visit: Payer: Self-pay

## 2014-10-31 ENCOUNTER — Ambulatory Visit (HOSPITAL_BASED_OUTPATIENT_CLINIC_OR_DEPARTMENT_OTHER): Payer: Commercial Managed Care - HMO

## 2014-10-31 VITALS — BP 150/60 | HR 62 | Temp 97.6°F | Resp 22

## 2014-10-31 DIAGNOSIS — D649 Anemia, unspecified: Secondary | ICD-10-CM

## 2014-10-31 DIAGNOSIS — M25551 Pain in right hip: Secondary | ICD-10-CM

## 2014-10-31 DIAGNOSIS — R296 Repeated falls: Secondary | ICD-10-CM

## 2014-10-31 MED ORDER — SODIUM CHLORIDE 0.9 % IV SOLN
510.0000 mg | Freq: Once | INTRAVENOUS | Status: AC
  Administered 2014-10-31: 510 mg via INTRAVENOUS
  Filled 2014-10-31: qty 17

## 2014-10-31 MED ORDER — SODIUM CHLORIDE 0.9 % IV SOLN
Freq: Once | INTRAVENOUS | Status: AC
  Administered 2014-10-31: 15:00:00 via INTRAVENOUS
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
## 2014-10-31 NOTE — Patient Instructions (Signed)

## 2014-11-03 ENCOUNTER — Ambulatory Visit (INDEPENDENT_AMBULATORY_CARE_PROVIDER_SITE_OTHER): Payer: Commercial Managed Care - HMO | Admitting: Family Medicine

## 2014-11-03 ENCOUNTER — Other Ambulatory Visit: Payer: Self-pay | Admitting: Physician Assistant

## 2014-11-03 VITALS — BP 158/74 | HR 65 | Temp 97.8°F | Resp 18 | Ht 66.0 in | Wt 215.5 lb

## 2014-11-03 DIAGNOSIS — R300 Dysuria: Secondary | ICD-10-CM

## 2014-11-03 DIAGNOSIS — R350 Frequency of micturition: Secondary | ICD-10-CM | POA: Diagnosis not present

## 2014-11-03 DIAGNOSIS — N3090 Cystitis, unspecified without hematuria: Secondary | ICD-10-CM | POA: Diagnosis not present

## 2014-11-03 DIAGNOSIS — R030 Elevated blood-pressure reading, without diagnosis of hypertension: Secondary | ICD-10-CM

## 2014-11-03 DIAGNOSIS — R3915 Urgency of urination: Secondary | ICD-10-CM

## 2014-11-03 DIAGNOSIS — IMO0001 Reserved for inherently not codable concepts without codable children: Secondary | ICD-10-CM

## 2014-11-03 DIAGNOSIS — I10 Essential (primary) hypertension: Secondary | ICD-10-CM | POA: Diagnosis not present

## 2014-11-03 LAB — POC MICROSCOPIC URINALYSIS (UMFC): MUCUS RE: ABSENT

## 2014-11-03 LAB — POCT URINALYSIS DIP (MANUAL ENTRY)
BILIRUBIN UA: NEGATIVE
Bilirubin, UA: NEGATIVE
Glucose, UA: NEGATIVE
Nitrite, UA: POSITIVE — AB
PH UA: 5.5
Spec Grav, UA: 1.02
Urobilinogen, UA: 0.2

## 2014-11-03 MED ORDER — CEPHALEXIN 500 MG PO CAPS: 500.0000 mg | ORAL_CAPSULE | Freq: Four times a day (QID) | ORAL | Status: DC

## 2014-11-03 NOTE — Progress Notes (Signed)
Patient ID: Chelsea Browning, female    DOB: 01-06-1933  Age: 81 y.o. MRN: [id number]  Chief Complaint  Patient presents with  . Urinary Tract Infection    C/O urinary frequency, urgency, & burning x 3 weeks    Subjective:   Patient is here complaining of urinary frequency and urgency and burning for the last several days. Her husband finally insisted she come on in. She has been stressed and worried about him because he was in the hospital with a surgical procedure. He is now back home. She is very worried because her blood pressure is elevated and doesn't want something to happen to her and leave him alone. Her legs are doing better.  Current allergies, medications, problem list, past/family and social histories reviewed.  Objective:  BP 158/74 mmHg  Pulse 65  Temp(Src) 97.8 F (36.6 C) (Oral)  Resp 18  Ht 5\' 6"  (1.676 m)  Wt 215 lb 8 oz (97.75 kg)  BMI 34.80 kg/m2  SpO2 98%  Chest is clear. Heart regular without any murmurs. I repeated the blood pressure several times. It gradually is coming down. Results for orders placed or performed in visit on 11/03/14  POCT urinalysis dipstick  Result Value Ref Range   Color, UA yellow yellow   Clarity, UA cloudy (A) clear   Glucose, UA negative negative   Bilirubin, UA negative negative   Ketones, POC UA negative negative   Spec Grav, UA 1.020    Blood, UA small (A) negative   pH, UA 5.5    Protein Ur, POC =100 (A) negative   Urobilinogen, UA 0.2    Nitrite, UA Positive (A) Negative   Leukocytes, UA large (3+) (A) Negative  POCT Microscopic Urinalysis ([facility])  Result Value Ref Range   WBC,UR,HPF,POC Many (A) None WBC/hpf   RBC,UR,HPF,POC Moderate (A) None RBC/hpf   Bacteria Moderate (A) None   Mucus Absent Absent   Epithelial Cells, UR Per Microscopy Few (A) None cells/hpf    Assessment & Plan:   Assessment: 1. Frequency of urination   2. Urgency of urination   3. Burning with urination   4. Cystitis   5. Essential  hypertension   6. Elevated BP       Plan: I recommended she come back in a couple of days first to recheck her blood pressure after her UTIs been partially treated. Consider hydrochlorothiazide if she needs for additional treatment. She is already taking one medication.  Orders Placed This Encounter  Procedures  . Urine culture  . POCT urinalysis dipstick  . POCT Microscopic Urinalysis ([facility])    Meds ordered this encounter  Medications  . cephALEXin (KEFLEX) 500 MG capsule    Sig: Take 1 capsule (500 mg total) by mouth 4 (four) times daily.    Dispense:  20 capsule    Refill:  0         Patient Instructions  Drink plenty of fluids  Take cephalexin 500 twice daily  Return on Thursday for a repeat of your blood pressure     Return if symptoms worsen or fail to improve.   HOPPER,DAVID, MD 11/03/2014

## 2014-11-03 NOTE — Patient Instructions (Signed)
Drink plenty of fluids  Take cephalexin 500 twice daily  Return on Thursday for a repeat of your blood pressure

## 2014-11-04 ENCOUNTER — Other Ambulatory Visit: Payer: Self-pay

## 2014-11-06 ENCOUNTER — Ambulatory Visit (INDEPENDENT_AMBULATORY_CARE_PROVIDER_SITE_OTHER): Payer: Commercial Managed Care - HMO | Admitting: Family Medicine

## 2014-11-06 VITALS — BP 162/64 | HR 74 | Temp 98.9°F | Resp 18

## 2014-11-06 DIAGNOSIS — N3090 Cystitis, unspecified without hematuria: Secondary | ICD-10-CM | POA: Diagnosis not present

## 2014-11-06 DIAGNOSIS — I10 Essential (primary) hypertension: Secondary | ICD-10-CM

## 2014-11-06 DIAGNOSIS — R300 Dysuria: Secondary | ICD-10-CM | POA: Diagnosis not present

## 2014-11-06 LAB — POC MICROSCOPIC URINALYSIS (UMFC): MUCUS RE: ABSENT

## 2014-11-06 LAB — POCT URINALYSIS DIP (MANUAL ENTRY)
BILIRUBIN UA: NEGATIVE
Bilirubin, UA: NEGATIVE
Glucose, UA: NEGATIVE
Nitrite, UA: NEGATIVE
PH UA: 5.5
SPEC GRAV UA: 1.02
UROBILINOGEN UA: 0.2

## 2014-11-06 LAB — URINE CULTURE

## 2014-11-06 NOTE — Progress Notes (Signed)
Patient ID: Chelsea Browning, female    DOB: Jan 19, 1933  Age: 81 y.o. MRN: [id number]  Chief Complaint  Patient presents with  . Follow-up    UTI    Subjective:   Patient is here for a follow-up from 3 days ago when she had a urinary tract infection but also had a blood pressure which he gone quite high. She is feeling better. Not having major urinary symptoms. No fevers.  Current allergies, medications, problem list, past/family and social histories reviewed.  Objective:  BP 162/64 mmHg  Pulse 74  Temp(Src) 98.9 F (37.2 C)  Resp 18  SpO2 98%  Pleasant lady, no acute distress this time. Minimal right CVA tenderness. Chest clear. Heart regular without murmurs. I repeated the blood pressure and got 160/70. Systolic still is remaining a little higher than usual for her.  Assessment & Plan:   Assessment: 1. Burning with urination   2. Essential hypertension       Plan: Check urinalysis Continue current meds. We will see what her blood pressure does over the next few visits.  Results for orders placed or performed in visit on 11/06/14  POCT urinalysis dipstick  Result Value Ref Range   Color, UA yellow yellow   Clarity, UA cloudy (A) clear   Glucose, UA negative negative   Bilirubin, UA negative negative   Ketones, POC UA negative negative   Spec Grav, UA 1.020    Blood, UA moderate (A) negative   pH, UA 5.5    Protein Ur, POC =30 (A) negative   Urobilinogen, UA 0.2    Nitrite, UA Negative Negative   Leukocytes, UA small (1+) (A) Negative  POCT Microscopic Urinalysis ([facility])  Result Value Ref Range   WBC,UR,HPF,POC Few (A) None WBC/hpf   RBC,UR,HPF,POC Moderate (A) None RBC/hpf   Bacteria None None   Mucus Absent Absent   Epithelial Cells, UR Per Microscopy None None cells/hpf     Orders Placed This Encounter  Procedures  . POCT urinalysis dipstick  . POCT Microscopic Urinalysis ([facility])    Patient Instructions  Chelsea Browning out the antibiotics  Return in about 6  weeks  We will check a repeat urine on return because of persisting red blood cells in the urine  Return sooner if problems     Return in about 6 weeks (around 12/18/2014).   Chelsea Borelli, MD 11/06/2014

## 2014-11-06 NOTE — Patient Instructions (Signed)
Finish out the antibiotics  Return in about 6 weeks  We will check a repeat urine on return because of persisting red blood cells in the urine  Return sooner if problems

## 2014-11-08 LAB — URINE CULTURE
Colony Count: NO GROWTH
ORGANISM ID, BACTERIA: NO GROWTH

## 2014-11-13 ENCOUNTER — Other Ambulatory Visit: Payer: Self-pay | Admitting: Physician Assistant

## 2014-11-27 ENCOUNTER — Telehealth: Payer: Self-pay

## 2014-11-27 NOTE — Telephone Encounter (Signed)
Dr. Linna Darner, Ms. heimburg wanted me to let you know that she wishes you a successful and safe journey to [place] and that she will miss you and come back soon

## 2014-11-27 NOTE — Telephone Encounter (Signed)
Dr. Linna Darner   Ms. Coil wanted to make sure that I let you know that she wishes you a safe and successful journey to [place] and she will miss you and come back soon

## 2014-11-27 NOTE — Telephone Encounter (Signed)
Noted  

## 2014-12-05 ENCOUNTER — Other Ambulatory Visit: Payer: Commercial Managed Care - HMO

## 2014-12-05 ENCOUNTER — Ambulatory Visit: Payer: Commercial Managed Care - HMO | Admitting: Hematology

## 2014-12-08 ENCOUNTER — Other Ambulatory Visit: Payer: Self-pay | Admitting: Gastroenterology

## 2014-12-11 ENCOUNTER — Telehealth: Payer: Self-pay | Admitting: *Deleted

## 2014-12-11 NOTE — Telephone Encounter (Signed)
Called the patient to advise we got a refill request from [facility] for Omeprazole 20 mg capsule.  I mentioned Dr. Deatra Ina no being here anymore and she had gotten the letter from our office.  I told her about Dr. Havery Moros and Dr. Silverio Decamp and she decided to make an appointment with Dr. Havery Moros for 02-18-2014 at 11:00 am . I told her I will forward this note to Dr. Havery Moros and request refills for the patient. I told her I will call her once her okay's refills.

## 2014-12-12 ENCOUNTER — Telehealth: Payer: Self-pay | Admitting: *Deleted

## 2014-12-12 ENCOUNTER — Other Ambulatory Visit: Payer: Self-pay | Admitting: *Deleted

## 2014-12-12 MED ORDER — OMEPRAZOLE 20 MG PO CPDR: DELAYED_RELEASE_CAPSULE | ORAL | Status: DC

## 2014-12-12 NOTE — Telephone Encounter (Signed)
-----   Message from Manus Gunning, MD sent at 12/11/2014  4:37 PM EDT ----- Regarding: RE: Refills for Omeprazole DR 20 mg.  Yes that's fine, thanks  ----- Message -----    From: Tonette Bihari, CMA    Sent: 12/11/2014   1:23 PM      To: Manus Gunning, MD Subject: Refills for Omeprazole DR 20 mg.               Dr. Havery Moros, This patient is a former Dr. Deatra Ina patient.  She needs refills for Omeprazole 20 mg capsules, once daily. This patient did make an appointment to see you on 02-18-2014 at 11:00 am . The patient has GERD.  Would you approve refills?  Thank you,  Marisue Humble CMA

## 2014-12-12 NOTE — Telephone Encounter (Signed)
Sent prescription to [facility] for refills per Dr. Havery Moros.

## 2014-12-19 ENCOUNTER — Telehealth: Payer: Self-pay | Admitting: Hematology

## 2014-12-19 ENCOUNTER — Other Ambulatory Visit: Payer: Self-pay | Admitting: *Deleted

## 2014-12-19 ENCOUNTER — Other Ambulatory Visit: Payer: Self-pay | Admitting: Physician Assistant

## 2014-12-19 NOTE — Telephone Encounter (Signed)
Returned patient call re 11/14 appointments. Per pt cannot come due to conflict. Gave patient new appointment for 11/17.

## 2014-12-22 ENCOUNTER — Other Ambulatory Visit: Payer: Commercial Managed Care - HMO

## 2014-12-22 ENCOUNTER — Ambulatory Visit: Payer: Commercial Managed Care - HMO | Admitting: Hematology

## 2014-12-22 ENCOUNTER — Other Ambulatory Visit: Payer: Self-pay | Admitting: Physician Assistant

## 2014-12-22 NOTE — Telephone Encounter (Signed)
PT IS CALLING TO REQUEST A REFILL OF TRAMADOL.

## 2014-12-24 ENCOUNTER — Other Ambulatory Visit: Payer: Self-pay | Admitting: *Deleted

## 2014-12-25 ENCOUNTER — Encounter: Payer: Self-pay | Admitting: Hematology

## 2014-12-25 ENCOUNTER — Other Ambulatory Visit (HOSPITAL_BASED_OUTPATIENT_CLINIC_OR_DEPARTMENT_OTHER): Payer: Commercial Managed Care - HMO

## 2014-12-25 ENCOUNTER — Telehealth: Payer: Self-pay | Admitting: Hematology

## 2014-12-25 ENCOUNTER — Ambulatory Visit (HOSPITAL_BASED_OUTPATIENT_CLINIC_OR_DEPARTMENT_OTHER): Payer: Commercial Managed Care - HMO | Admitting: Hematology

## 2014-12-25 VITALS — BP 166/53 | HR 62 | Temp 98.2°F | Resp 18 | Ht 66.0 in | Wt 232.4 lb

## 2014-12-25 DIAGNOSIS — D649 Anemia, unspecified: Secondary | ICD-10-CM

## 2014-12-25 DIAGNOSIS — D801 Nonfamilial hypogammaglobulinemia: Secondary | ICD-10-CM | POA: Diagnosis not present

## 2014-12-25 DIAGNOSIS — D472 Monoclonal gammopathy: Secondary | ICD-10-CM | POA: Diagnosis not present

## 2014-12-25 LAB — COMPREHENSIVE METABOLIC PANEL (CC13)
ALT: 11 U/L (ref 0–55)
ANION GAP: 9 meq/L (ref 3–11)
AST: 16 U/L (ref 5–34)
Albumin: 3.8 g/dL (ref 3.5–5.0)
Alkaline Phosphatase: 108 U/L (ref 40–150)
BUN: 21.6 mg/dL (ref 7.0–26.0)
CALCIUM: 9.6 mg/dL (ref 8.4–10.4)
CHLORIDE: 107 meq/L (ref 98–109)
CO2: 23 meq/L (ref 22–29)
Creatinine: 0.8 mg/dL (ref 0.6–1.1)
EGFR: 70 mL/min/{1.73_m2} — ABNORMAL LOW (ref >90)
Glucose: 103 mg/dl (ref 70–140)
POTASSIUM: 4.1 meq/L (ref 3.5–5.1)
Sodium: 139 mEq/L (ref 136–145)
Total Bilirubin: 0.48 mg/dL (ref 0.20–1.20)
Total Protein: 6.4 g/dL (ref 6.4–8.3)

## 2014-12-25 LAB — CBC & DIFF AND RETIC
BASO%: 0.6 % (ref 0.0–2.0)
Basophils Absolute: 0 10*3/uL (ref 0.0–0.1)
EOS%: 5.2 % (ref 0.0–7.0)
Eosinophils Absolute: 0.3 10*3/uL (ref 0.0–0.5)
HEMATOCRIT: 30.3 % — AB (ref 34.8–46.6)
HGB: 10.1 g/dL — ABNORMAL LOW (ref 11.6–15.9)
Immature Retic Fract: 10.2 % — ABNORMAL HIGH (ref 1.60–10.00)
LYMPH#: 0.9 10*3/uL (ref 0.9–3.3)
LYMPH%: 17.9 % (ref 14.0–49.7)
MCH: 30.5 pg (ref 25.1–34.0)
MCHC: 33.3 g/dL (ref 31.5–36.0)
MCV: 91.5 fL (ref 79.5–101.0)
MONO#: 0.5 10*3/uL (ref 0.1–0.9)
MONO%: 9.5 % (ref 0.0–14.0)
NEUT%: 66.8 % (ref 38.4–76.8)
NEUTROS ABS: 3.4 10*3/uL (ref 1.5–6.5)
Platelets: 163 10*3/uL (ref 145–400)
RBC: 3.31 10*6/uL — AB (ref 3.70–5.45)
RDW: 15.5 % — AB (ref 11.2–14.5)
RETIC %: 2.1 % (ref 0.70–2.10)
RETIC CT ABS: 69.51 10*3/uL (ref 33.70–90.70)
WBC: 5 10*3/uL (ref 3.9–10.3)
nRBC: 0 % (ref 0–0)

## 2014-12-25 LAB — IRON AND TIBC CHCC
%SAT: 33 % (ref 21–57)
IRON: 83 ug/dL (ref 41–142)
TIBC: 251 ug/dL (ref 236–444)
UIBC: 168 ug/dL (ref 120–384)

## 2014-12-25 LAB — FERRITIN CHCC: FERRITIN: 303 ng/mL — AB (ref 9–269)

## 2014-12-25 NOTE — Telephone Encounter (Signed)
per pof to sch pt appt-gave pt copy of avs °

## 2014-12-29 ENCOUNTER — Other Ambulatory Visit: Payer: Self-pay | Admitting: Physician Assistant

## 2014-12-31 ENCOUNTER — Other Ambulatory Visit: Payer: Self-pay | Admitting: Physician Assistant

## 2015-01-12 ENCOUNTER — Other Ambulatory Visit: Payer: Self-pay | Admitting: Physician Assistant

## 2015-01-12 ENCOUNTER — Encounter: Payer: Self-pay | Admitting: Family Medicine

## 2015-01-12 ENCOUNTER — Ambulatory Visit (INDEPENDENT_AMBULATORY_CARE_PROVIDER_SITE_OTHER): Payer: Commercial Managed Care - HMO | Admitting: Family Medicine

## 2015-01-12 VITALS — BP 148/80 | HR 69 | Temp 98.5°F | Resp 16 | Ht 66.0 in | Wt 227.2 lb

## 2015-01-12 DIAGNOSIS — I8311 Varicose veins of right lower extremity with inflammation: Secondary | ICD-10-CM

## 2015-01-12 DIAGNOSIS — G8929 Other chronic pain: Secondary | ICD-10-CM

## 2015-01-12 DIAGNOSIS — I872 Venous insufficiency (chronic) (peripheral): Secondary | ICD-10-CM

## 2015-01-12 DIAGNOSIS — F329 Major depressive disorder, single episode, unspecified: Secondary | ICD-10-CM

## 2015-01-12 DIAGNOSIS — R002 Palpitations: Secondary | ICD-10-CM

## 2015-01-12 DIAGNOSIS — I8312 Varicose veins of left lower extremity with inflammation: Secondary | ICD-10-CM | POA: Diagnosis not present

## 2015-01-12 DIAGNOSIS — M25551 Pain in right hip: Secondary | ICD-10-CM | POA: Diagnosis not present

## 2015-01-12 DIAGNOSIS — F32A Depression, unspecified: Secondary | ICD-10-CM

## 2015-01-12 MED ORDER — CITALOPRAM HYDROBROMIDE 10 MG PO TABS: 10.0000 mg | ORAL_TABLET | Freq: Every day | ORAL | Status: DC

## 2015-01-12 NOTE — Progress Notes (Signed)
Patient ID: Chelsea Browning, female    DOB: 11-Jun-1932  Age: 81 y.o. MRN: [id number]  Chief Complaint  Patient presents with  . body pain    right hip extending down right leg    Subjective:   She is depressed. She is having the most pain she has had. It extends from her hip and back down her leg to her knee. She says the swelling of the legs is doing fairly well. She uses the pump stockings regularly. No new injuries. She is discouraged because she has been told again by Dr. Mardelle Matte that surgery on her hip carries higher risk to it. We talked about the fact that many very elderly people and up having to have some hip surgery because they break hips. I think she probably could make it through surgery, but could have problems also. There is not an easy choice, since if she doesn't get something done she is going to become progressively more immobile. He had a very long discussion about this. Her leg strength seems adequate. She can lift her legs off the stretcher, but does have a lot of pain in the hip. The edema has improved in her lower extremities still and she does not have the stasis dermatitis that she once had.  Current allergies, medications, problem list, past/family and social histories reviewed.  Objective:  BP 148/80 mmHg  Pulse 69  Temp(Src) 98.5 F (36.9 C) (Oral)  Resp 16  Ht 5\' 6"  (1.676 m)  Wt 227 lb 3.2 oz (103.057 kg)  BMI 36.69 kg/m2  SpO2 98%  Noted above. Strength is satisfactory and the stasis dermatitis is improved. She walks very well on a walker. She is depressed. We discussed that in long fashion.  Assessment & Plan:   Assessment: 1. Depression   2. Hip pain, chronic, right   3. Stasis dermatitis of both legs   4. Palpitations       Plan: Counseling took up about 20-25 minutes of the time. See instructions.  No orders of the defined types were placed in this encounter.    Meds ordered this encounter  Medications  . citalopram (CELEXA) 10 MG tablet     Sig: Take 1 tablet (10 mg total) by mouth daily.    Dispense:  30 tablet    Refill:  30         Patient Instructions  Do not exceed 20 mg daily of the omeprazole when you are on the antidepressant  Begin taking Celexa (citalopram) 10 mg 1 each morning for depression. If not improving at all in 2 weeks return because we may want to increase the dose. I will be in the office on the 18th and 19th. Do not take the tramadol when on this Celexa.  Use the hydrocodone when needed for severe pain, Tylenol for your usual pain  Continue to try and get as much exercise as possible.  It might be worth seeing whether you who could get approved for physical therapy elsewhere. More likely to get approved if Dr. Luanna Cole office orders it.  Despite your age and the associated surgical risks, your options are difficult and I think it is worth you considering going ahead and getting replacement surgery done once your hemoglobin has built back up. However this decision is purely up to you as per our discussion.     Return in about 2 months (around 03/15/2015), or if symptoms worsen or fail to improve.   HOPPER,DAVID, MD 01/12/2015

## 2015-01-12 NOTE — Patient Instructions (Addendum)
Do not exceed 20 mg daily of the omeprazole when you are on the antidepressant  Begin taking Celexa (citalopram) 10 mg 1 each morning for depression. If not improving at all in 2 weeks return because we may want to increase the dose. I will be in the office on the 18th and 19th. Do not take the tramadol when on this Celexa.  Use the hydrocodone when needed for severe pain, Tylenol for your usual pain  Continue to try and get as much exercise as possible.  It might be worth seeing whether you who could get approved for physical therapy elsewhere. More likely to get approved if Dr. Luanna Cole office orders it.  Despite your age and the associated surgical risks, your options are difficult and I think it is worth you considering going ahead and getting replacement surgery done once your hemoglobin has built back up. However this decision is purely up to you as per our discussion.

## 2015-01-13 ENCOUNTER — Other Ambulatory Visit: Payer: Self-pay | Admitting: Physician Assistant
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
## 2015-02-02 ENCOUNTER — Other Ambulatory Visit: Payer: Self-pay | Admitting: Physician Assistant

## 2015-02-02 ENCOUNTER — Telehealth: Payer: Self-pay | Admitting: Family Medicine

## 2015-02-02 NOTE — Telephone Encounter (Signed)
Patient called to ask some questions about an upcoming surgery. It was difficult to follow exactly what this patient needs. Please call her at [phone]

## 2015-02-03 NOTE — Telephone Encounter (Signed)
Spoke with pt, she wants to talk to Dr. Linna Darner only about the surgery. She states she is saying her surgeon is sating she is a risk. She would like for you to call her.

## 2015-02-05 NOTE — Telephone Encounter (Signed)
Pt called again. Please see previous message! She would like Dr. Linna Darner to call her.

## 2015-02-07 ENCOUNTER — Telehealth: Payer: Self-pay

## 2015-02-07 NOTE — Telephone Encounter (Signed)
Message is for Dr. Linna Darner, she wanted to let him know she will be in next week to see him.

## 2015-02-16 NOTE — Telephone Encounter (Signed)
Pt would like Dr. Linna Darner to know she missed his call. She is excising and she plans to come in and see him on 1/20-Friday.

## 2015-02-17 ENCOUNTER — Ambulatory Visit: Payer: Commercial Managed Care - HMO | Admitting: Family Medicine

## 2015-02-19 ENCOUNTER — Ambulatory Visit: Payer: Commercial Managed Care - HMO | Admitting: Gastroenterology

## 2015-02-27 ENCOUNTER — Encounter: Payer: Self-pay | Admitting: Family Medicine

## 2015-02-27 ENCOUNTER — Ambulatory Visit (INDEPENDENT_AMBULATORY_CARE_PROVIDER_SITE_OTHER): Payer: Commercial Managed Care - HMO | Admitting: Family Medicine

## 2015-02-27 VITALS — BP 200/70 | HR 69 | Temp 98.2°F | Resp 16 | Wt 217.0 lb

## 2015-02-27 DIAGNOSIS — R42 Dizziness and giddiness: Secondary | ICD-10-CM

## 2015-02-27 DIAGNOSIS — F411 Generalized anxiety disorder: Secondary | ICD-10-CM

## 2015-02-27 DIAGNOSIS — R002 Palpitations: Secondary | ICD-10-CM

## 2015-02-27 DIAGNOSIS — D509 Iron deficiency anemia, unspecified: Secondary | ICD-10-CM

## 2015-02-27 DIAGNOSIS — I10 Essential (primary) hypertension: Secondary | ICD-10-CM

## 2015-02-27 LAB — POCT CBC
GRANULOCYTE PERCENT: 77.3 % (ref 37–80)
HCT, POC: 33.4 % — AB (ref 37.7–47.9)
Hemoglobin: 11.5 g/dL — AB (ref 12.2–16.2)
LYMPH, POC: 1.1 (ref 0.6–3.4)
MCH, POC: 31.1 pg (ref 27–31.2)
MCHC: 34.5 g/dL (ref 31.8–35.4)
MCV: 90.3 fL (ref 80–97)
MID (cbc): 0.2 (ref 0–0.9)
MPV: 6.4 fL (ref 0–99.8)
PLATELET COUNT, POC: 175 10*3/uL (ref 142–424)
POC Granulocyte: 4.3 (ref 2–6.9)
POC LYMPH %: 19.4 % (ref 10–50)
POC MID %: 3.3 %M (ref 0–12)
RBC: 3.7 M/uL — AB (ref 4.04–5.48)
RDW, POC: 14.7 %
WBC: 5.6 10*3/uL (ref 4.6–10.2)

## 2015-02-27 MED ORDER — LOSARTAN POTASSIUM 50 MG PO TABS: 50.0000 mg | ORAL_TABLET | Freq: Every day | ORAL | Status: DC

## 2015-02-27 MED ORDER — CLONIDINE HCL 0.1 MG PO TABS
0.1000 mg | ORAL_TABLET | Freq: Once | ORAL | Status: AC
  Administered 2015-02-27: 0.1 mg via ORAL

## 2015-02-27 NOTE — Patient Instructions (Signed)
Take losartan 50 mg 1 daily at bedtime  You have received clonidine 0.1 mg single dose here in the office.  Check your blood pressure once or twice a day for the next few days. If the blood pressure is still running greater than 180 by Monday please return for recheck, sooner if you have further concerns.  Continue current care of your legs  In the event of increased dizziness or headaches or chest pain or other concerns go to the emergency room  Avoid excessive salt, which I think you avoid already.  If your blood pressure is doing well plan to return in 2 weeks for recheck.

## 2015-02-27 NOTE — Progress Notes (Signed)
Patient ID: Chelsea Browning, female    DOB: May 03, 1932  Age: 82 y.o. MRN: [id number]  Chief Complaint  Patient presents with  . Headache  . Dizziness  . Neck Pain  . Hypertension    Subjective:   Patient is well-known to me. She has been having problems with her blood pressure at home being higher over the last week, with a reading of 180 today. She had a little dizziness this morning she had been more anxious and has been concerned about it a lot. Her legs have been doing quite well. She still is considering her right hip surgery and has not able to bring herself to commit to it yet. She is fearful of complications of surgery. She takes her medicines faithfully. She has been doing some physical therapy which has helped her. She has had some headache today. She took Tylenol twice. That helped some. Her husband accompanied her here today and he had some questions also.  Current allergies, medications, problem list, past/family and social histories reviewed.  Objective:  BP 200/70 mmHg  Pulse 69  Temp(Src) 98.2 F (36.8 C)  Resp 16  Wt 217 lb (98.431 kg)  Blood pressure is high as noted. On taking her readings several additional times I got similar readings. However I can hear the systolic up around 200 and down for a few beats down to around 180, then the sound is lost until down to around 160 when a stronger beat comes in. It is a little hard to be definite which is the true top number for pressure. She is alert and oriented. Throat clear. Neck supple without nodes or thyromegaly. Chest is clear to auscultation. Heart regular without murmurs. No ankle edema. Stasis dermatitis is completely gone. Not edematous at all today. She has lost some weight.  Assessment & Plan:   Assessment: 1. Malignant hypertension   2. Anxiety state   3. Anemia, iron deficiency   4. Dizziness   5. Palpitations       Plan: 1 ahead and gave her 0.1 mg of clonidine. Started on some losartan. Monitor blood  pressures at home. Return if not improving. Go to the ER at all worse.  EKG normal. Rhythm strip normal. She has had some palpitations but I do not see any ectopy.  Orders Placed This Encounter  Procedures  . Basic metabolic panel  . POCT CBC  . EKG 12-Lead    Meds ordered this encounter  Medications  . cloNIDine (CATAPRES) tablet 0.1 mg    Sig:   . losartan (COZAAR) 50 MG tablet    Sig: Take 1 tablet (50 mg total) by mouth daily.    Dispense:  30 tablet    Refill:  3   Results for orders placed or performed in visit on 02/27/15  POCT CBC  Result Value Ref Range   WBC 5.6 4.6 - 10.2 K/uL   Lymph, poc 1.1 0.6 - 3.4   POC LYMPH PERCENT 19.4 10 - 50 %L   MID (cbc) 0.2 0 - 0.9   POC MID % 3.3 0 - 12 %M   POC Granulocyte 4.3 2 - 6.9   Granulocyte percent 77.3 37 - 80 %G   RBC 3.70 (A) 4.04 - 5.48 M/uL   Hemoglobin 11.5 (A) 12.2 - 16.2 g/dL   HCT, POC 02.7 (A) 25.3 - 47.9 %   MCV 90.3 80 - 97 fL   MCH, POC 31.1 27 - 31.2 pg   MCHC 34.5 31.8 - 35.4  g/dL   RDW, POC 65.7 %   Platelet Count, POC 175 142 - 424 K/uL   MPV 6.4 0 - 99.8 fL          Patient Instructions  Take losartan 50 mg 1 daily at bedtime  You have received clonidine 0.1 mg single dose here in the office.  Check your blood pressure once or twice a day for the next few days. If the blood pressure is still running greater than 180 by Monday please return for recheck, sooner if you have further concerns.  Continue current care of your legs  In the event of increased dizziness or headaches or chest pain or other concerns go to the emergency room  Avoid excessive salt, which I think you avoid already.  If your blood pressure is doing well plan to return in 2 weeks for recheck.     No Follow-up on file.   Lurena Naeve, MD 02/27/2015

## 2015-02-27 NOTE — Progress Notes (Deleted)
   Subjective:    Patient ID: Chelsea Browning, female    DOB: 17-Jul-1932, 82 y.o.   MRN: [id number]  HPI    Review of Systems     Objective:   Physical Exam        Assessment & Plan:

## 2015-02-28 LAB — BASIC METABOLIC PANEL
BUN: 21 mg/dL (ref 7–25)
CHLORIDE: 101 mmol/L (ref 98–110)
CO2: 26 mmol/L (ref 20–31)
Calcium: 9.6 mg/dL (ref 8.6–10.4)
Creat: 0.78 mg/dL (ref 0.60–0.88)
GLUCOSE: 107 mg/dL — AB (ref 65–99)
POTASSIUM: 4.3 mmol/L (ref 3.5–5.3)
Sodium: 134 mmol/L — ABNORMAL LOW (ref 135–146)

## 2015-03-02 ENCOUNTER — Telehealth: Payer: Self-pay

## 2015-03-02 NOTE — Telephone Encounter (Signed)
Pt called to let Dr. Linna Darner know that her latest BP reading from today is 177/53

## 2015-03-03 ENCOUNTER — Telehealth: Payer: Self-pay

## 2015-03-03 ENCOUNTER — Ambulatory Visit (INDEPENDENT_AMBULATORY_CARE_PROVIDER_SITE_OTHER): Payer: Commercial Managed Care - HMO | Admitting: Family Medicine

## 2015-03-03 VITALS — BP 178/72 | HR 70 | Temp 98.3°F | Resp 17 | Ht 66.0 in | Wt 219.0 lb

## 2015-03-03 DIAGNOSIS — I10 Essential (primary) hypertension: Secondary | ICD-10-CM | POA: Diagnosis not present

## 2015-03-03 NOTE — Telephone Encounter (Signed)
I cannot find anywhere in her chart that we received a surgery clearance for pt. Nothing has been scanned in and no telephone messages that we received one and for it to be filled out.  Dr. Linna Darner,  Do you remember filling out a form for the pt?

## 2015-03-03 NOTE — Progress Notes (Signed)
Patient ID: Chelsea Browning, female    DOB: 06-09-1932  Age: 82 y.o. MRN: [id number]  Chief Complaint  Patient presents with  . Follow-up    HBP    Subjective:   Patient is here for follow-up of her blood pressure. It was very high the other day and losartan was added. She is also given 1 Catapres which helped well transiently, but is not the drug of choice for long-term use. She has been doing okay with no major new symptoms. Her pressures have been variable and elevated at home, though not as high as they were when she was in here last week.  Current allergies, medications, problem list, past/family and social histories reviewed.  Objective:  BP 178/72 mmHg  Pulse 70  Temp(Src) 98.3 F (36.8 C) (Oral)  Resp 17  Ht 5\' 6"  (1.676 m)  Wt 219 lb (99.338 kg)  BMI 35.36 kg/m2  SpO2 98%  Chest is clear. Heart regular. Blood pressures as noted. Foley alert and oriented.  Assessment & Plan:   Assessment: 1. Essential hypertension       Plan: Will increase the losartan and follow-up again in one week. We'll contact her orthopedist office and let them know that surgery will have to be postponed for a month or 2 until we can be sure that the blood pressures are consistently running good.   Patient Instructions  Take losartan 100 mg daily for blood pressure  Return one week.     Return in about 1 week (around 03/10/2015).   Melitta Tigue, MD 03/03/2015

## 2015-03-03 NOTE — Patient Instructions (Signed)
Take losartan 100 mg daily for blood pressure  Return one week.

## 2015-03-03 NOTE — Telephone Encounter (Signed)
Sherry with [facility] is calling to get medical clearance on a rt hip replacement for this patient she states that she faxed the form on 01/19/15    Best number to call [phone]

## 2015-03-03 NOTE — Telephone Encounter (Signed)
Ignore previous message. I spoke to Dr. Linna Darner. I was getting ready to call pt and she showed up in the office to see Dr. Linna Darner. Dr. Linna Darner will speak to pt during visit about surgery.

## 2015-03-04 ENCOUNTER — Ambulatory Visit (INDEPENDENT_AMBULATORY_CARE_PROVIDER_SITE_OTHER): Payer: Commercial Managed Care - HMO | Admitting: Family Medicine

## 2015-03-04 ENCOUNTER — Other Ambulatory Visit: Payer: Self-pay | Admitting: *Deleted

## 2015-03-04 VITALS — BP 132/80 | HR 93 | Temp 98.1°F | Resp 17 | Ht <= 58 in | Wt 219.0 lb

## 2015-03-04 DIAGNOSIS — I10 Essential (primary) hypertension: Secondary | ICD-10-CM

## 2015-03-04 DIAGNOSIS — R600 Localized edema: Secondary | ICD-10-CM | POA: Diagnosis not present

## 2015-03-04 DIAGNOSIS — M25551 Pain in right hip: Secondary | ICD-10-CM

## 2015-03-04 DIAGNOSIS — G8929 Other chronic pain: Secondary | ICD-10-CM | POA: Diagnosis not present

## 2015-03-04 MED ORDER — LABETALOL HCL 200 MG PO TABS: 200.0000 mg | ORAL_TABLET | Freq: Three times a day (TID) | ORAL | Status: DC

## 2015-03-04 MED ORDER — LABETALOL HCL 100 MG PO TABS: 100.0000 mg | ORAL_TABLET | Freq: Two times a day (BID) | ORAL | Status: DC

## 2015-03-04 NOTE — Progress Notes (Signed)
82 yo woman with accelerated hypertension.  Headache and dizziness are mild and persisting.  Also anemia is being treated with IV iron    Objective:  Alert and cheerful BP 132/80 mmHg  Pulse 93  Temp(Src) 98.1 F (36.7 C) (Oral)  Resp 17  Ht 1' (0.305 m)  Wt 219 lb (99.338 kg)  BMI 1067.86 kg/m2  SpO2 97% Heart: regular with physiologic split S2 Chest:  Clear Ext:  2+ edema with scaly reddened lower leg  Assessment:  Needs simpler medicine regimen-  D/c dipyridamole D/c losartan D/c ferrous sulfate    ICD-9-CM ICD-10-CM   1. Accelerated hypertension 401.0 I10 labetalol (NORMODYNE) 100 MG tablet  2. Hip pain, chronic, right 719.45 M25.551    338.29 G89.29   3. Pedal edema 782.3 R60.0      Signed, Robyn Haber, MD\

## 2015-03-04 NOTE — Patient Instructions (Addendum)
Please return in one week or call me with blood pressure readings:  [phone]  Stop the Dipyridamole, iron, and losartan

## 2015-03-05 ENCOUNTER — Ambulatory Visit (INDEPENDENT_AMBULATORY_CARE_PROVIDER_SITE_OTHER): Payer: Commercial Managed Care - HMO | Admitting: Family Medicine

## 2015-03-05 ENCOUNTER — Encounter (HOSPITAL_COMMUNITY): Payer: Self-pay | Admitting: *Deleted

## 2015-03-05 ENCOUNTER — Emergency Department (HOSPITAL_COMMUNITY): Payer: Commercial Managed Care - HMO

## 2015-03-05 ENCOUNTER — Emergency Department (HOSPITAL_COMMUNITY)
Admission: EM | Admit: 2015-03-05 | Discharge: 2015-03-06 | Disposition: A | Discharge status: Home / Self Care | Payer: Commercial Managed Care - HMO | Attending: Emergency Medicine | Admitting: Emergency Medicine

## 2015-03-05 VITALS — BP 186/74 | HR 70 | Temp 98.2°F | Resp 16 | Ht 63.0 in | Wt 217.0 lb

## 2015-03-05 DIAGNOSIS — Z8669 Personal history of other diseases of the nervous system and sense organs: Secondary | ICD-10-CM | POA: Insufficient documentation

## 2015-03-05 DIAGNOSIS — I159 Secondary hypertension, unspecified: Secondary | ICD-10-CM

## 2015-03-05 DIAGNOSIS — Z862 Personal history of diseases of the blood and blood-forming organs and certain disorders involving the immune mechanism: Secondary | ICD-10-CM | POA: Insufficient documentation

## 2015-03-05 DIAGNOSIS — E785 Hyperlipidemia, unspecified: Secondary | ICD-10-CM | POA: Insufficient documentation

## 2015-03-05 DIAGNOSIS — I10 Essential (primary) hypertension: Secondary | ICD-10-CM | POA: Diagnosis not present

## 2015-03-05 DIAGNOSIS — Z872 Personal history of diseases of the skin and subcutaneous tissue: Secondary | ICD-10-CM | POA: Diagnosis not present

## 2015-03-05 DIAGNOSIS — R002 Palpitations: Secondary | ICD-10-CM

## 2015-03-05 DIAGNOSIS — F418 Other specified anxiety disorders: Secondary | ICD-10-CM | POA: Insufficient documentation

## 2015-03-05 DIAGNOSIS — E669 Obesity, unspecified: Secondary | ICD-10-CM | POA: Insufficient documentation

## 2015-03-05 DIAGNOSIS — K219 Gastro-esophageal reflux disease without esophagitis: Secondary | ICD-10-CM | POA: Insufficient documentation

## 2015-03-05 DIAGNOSIS — M7989 Other specified soft tissue disorders: Secondary | ICD-10-CM | POA: Insufficient documentation

## 2015-03-05 DIAGNOSIS — Z79899 Other long term (current) drug therapy: Secondary | ICD-10-CM | POA: Insufficient documentation

## 2015-03-05 LAB — CBC
HEMATOCRIT: 34.9 % — AB (ref 36.0–46.0)
HEMOGLOBIN: 11.9 g/dL — AB (ref 12.0–15.0)
MCH: 30.8 pg (ref 26.0–34.0)
MCHC: 34.1 g/dL (ref 30.0–36.0)
MCV: 90.4 fL (ref 78.0–100.0)
Platelets: 164 10*3/uL (ref 150–400)
RBC: 3.86 MIL/uL — AB (ref 3.87–5.11)
RDW: 13.7 % (ref 11.5–15.5)
WBC: 5.5 10*3/uL (ref 4.0–10.5)

## 2015-03-05 LAB — I-STAT TROPONIN, ED
Troponin i, poc: 0.08 ng/mL (ref 0.00–0.08)
Troponin i, poc: 0.09 ng/mL (ref 0.00–0.08)

## 2015-03-05 LAB — BASIC METABOLIC PANEL
ANION GAP: 9 (ref 5–15)
BUN: 14 mg/dL (ref 6–20)
CALCIUM: 10 mg/dL (ref 8.9–10.3)
CO2: 25 mmol/L (ref 22–32)
Chloride: 99 mmol/L — ABNORMAL LOW (ref 101–111)
Creatinine, Ser: 0.79 mg/dL (ref 0.44–1.00)
GFR calc Af Amer: >60 mL/min (ref >60)
GFR calc non Af Amer: >60 mL/min (ref >60)
GLUCOSE: 119 mg/dL — AB (ref 65–99)
Potassium: 4.2 mmol/L (ref 3.5–5.1)
Sodium: 133 mmol/L — ABNORMAL LOW (ref 135–145)

## 2015-03-05 MED ORDER — LOSARTAN POTASSIUM 50 MG PO TABS
50.0000 mg | ORAL_TABLET | Freq: Every day | ORAL | Status: DC
Start: 2015-03-05 — End: 2015-05-15

## 2015-03-05 MED ORDER — AMLODIPINE BESYLATE 2.5 MG PO TABS: 2.5000 mg | ORAL_TABLET | Freq: Every day | ORAL | Status: DC

## 2015-03-05 NOTE — ED Notes (Signed)
Pt reports high blood pressure for several days. Pt reports fluttering and pressure in her chest. Pt has not taken her medications tonight.
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
## 2015-03-05 NOTE — Patient Instructions (Addendum)
After leaving our office, go to [facility] emergency room for further evaluation and monitoring of your fluttering sensation in the chest. They may be able to monitor you there in the ER and do some other testing and possibly send you home tonight with follow-up outpatient with a cardiologist, but that is up to them and their evaluation.  For your blood pressure, continue labetalol 3 times per day, restart the losartan at 50 mg 1 pill per day, and start new medication amlodipine 2.5mg  once per day.  Follow-up with Dr. Tamala Julian this Saturday to check your blood pressure.
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
## 2015-03-05 NOTE — ED Notes (Signed)
Per family pt has only been taking 300mg  of her prescribed 600mg  dose of BP medication.

## 2015-03-06 ENCOUNTER — Telehealth: Payer: Self-pay

## 2015-03-06 NOTE — Telephone Encounter (Signed)
Patient is returning a missed phone call. She would like to speak to Dr. Linna Darner when he gets back.

## 2015-03-06 NOTE — Telephone Encounter (Signed)
Noted.  Saw patient.

## 2015-03-06 NOTE — Discharge Instructions (Signed)

## 2015-03-08 NOTE — Telephone Encounter (Signed)
Dr. Linna Darner please Advise

## 2015-03-08 NOTE — Telephone Encounter (Signed)
Patient is calling to speak to Dr. Linna Darner as soon as possible. She states that she was in the emergency room from 4 until midnight Saturday 1/28 and she was sent by Dr. Carlota Raspberry. Patient also wants a referral to a cardiologist.  [phone]

## 2015-03-09 ENCOUNTER — Other Ambulatory Visit: Payer: Self-pay

## 2015-03-09 DIAGNOSIS — I159 Secondary hypertension, unspecified: Secondary | ICD-10-CM

## 2015-03-09 NOTE — Patient Outreach (Signed)
[facility]) Care Management  03/09/2015  Chelsea Browning [birth year] [id number]   Telephone Screen  Referral Date:03/06/15 Referral Source: [employer]) Referral Reason: "HTN"   Outreach attempt #1 to patient. Patient reached and screening completed.  Social: Patient resides in her home along with her spouse. She states she moved to [facility] [place] [facility] from [place] about three years ago to be closer to her grandkids. She is independent with ADLs. She relies on spouse or her dtr to take her to medical appts. Patient reports she has [facility] services that comes in to help assist her when her family is out of town. Denies any recent falls. She is using a walker. Patient states she is going to outpatient rehab at [facility] office 2x/wk. She needs hip replacement but surgery on hold right now.  Conditions: Patient has h/o: HTN, anemia, anxiety, HLD and esophageal hernia repair. She reports ongoing issues with BP. Patient does not have her own BP monitor but states she has been borrowing a neighbor's. Patient has had multiple ED visits for issues related to elevated BP. Last ED visit was 03/05/15. She reported BP this am before taking meds was 190/81She has not rechecked BP sicne taking meds but encouraged to do so and alert MD office if still elevated. She does state she gets symptomatic at times with elevated BP. Patient aware that she needs to alert MD for elevated readings. She reports BP meds continue to be adjusted. Patient voices adhering to low salt diet. She is able to verbalize some s/s of elevated BP along with s/so of stroke and heart attack.  Medications: Patient reports taking less than 10 meds. However, she voices difficulty affording meds.  Appointments: Patient states she has appt with PCP-Dr. Ruben Reason on tomorrow(03/10/15). She has also been advised to see cardiologist for BP issues which PCP office is to arrange.  Consent: Patient gave verbal consent for [facility]  services.   Plan: RN CM will notify [facility] administrative assistant of case status. RN CM will send [facility] community referral for further in home eval and assessment of BP management. RN CM will send [facility] pharmacy referral for possible med assistance. RN CM provided patient with [facility] contact info. RN CM confirmed patient is aware of when to seek medical care for changes in condition.  Enzo Montgomery, RN,BSN,CCM [facility] Telephonic Care Management Coordinator Direct Phone: [phone] Toll Free: [phone] Fax: [phone]

## 2015-03-10 ENCOUNTER — Ambulatory Visit (INDEPENDENT_AMBULATORY_CARE_PROVIDER_SITE_OTHER): Payer: Commercial Managed Care - HMO | Admitting: Family Medicine

## 2015-03-10 VITALS — BP 156/66 | HR 71 | Temp 98.2°F | Resp 17 | Ht 63.0 in | Wt 216.0 lb

## 2015-03-10 DIAGNOSIS — I517 Cardiomegaly: Secondary | ICD-10-CM | POA: Diagnosis not present

## 2015-03-10 DIAGNOSIS — F411 Generalized anxiety disorder: Secondary | ICD-10-CM

## 2015-03-10 DIAGNOSIS — I10 Essential (primary) hypertension: Secondary | ICD-10-CM

## 2015-03-10 NOTE — Progress Notes (Signed)
Patient ID: Chelsea Browning, female    DOB: Jul 06, 1932  Age: 82 y.o. MRN: [id number]  Chief Complaint  Patient presents with  . Follow-up    hbp     Subjective:   82 year old well known to me. She has been running a high blood pressure over the last several weeks. I have seen her for it, she came back in here and saw Dr. Nyoka Cowden, and has had to go to the emergency room with it. The emergency room physicians told her to see a cardiologist 2 days, which has added to her anxiety about it. She had a little discomfort in her right jaw on one occasion, no chest pains.  Current allergies, medications, problem list, past/family and social histories reviewed.  Objective:  BP 156/66 mmHg  Pulse 71  Temp(Src) 98.2 F (36.8 C) (Oral)  Resp 17  Ht 5\' 3"  (1.6 m)  Wt 216 lb (97.977 kg)  BMI 38.27 kg/m2  SpO2 96%  No major acute distress. Chest clear. Heart regular without murmur. Blood pressure improved on retake.  Assessment & Plan:   Assessment: 1. Essential hypertension   2. Cardiomegaly   3. Anxiety state       Plan: We'll refer to cardiology and proceed from there. She has not yet started the amlodipine that she was given, and I advised her going ahead and taking it along with continue her other medications.  No orders of the defined types were placed in this encounter.    No orders of the defined types were placed in this encounter.         Patient Instructions  Begin taking the amlodipine that Dr. Carlota Browning gave you.  See the cardiologist as soon as we can get you an.  Return sooner if problems or concerns       No Follow-up on file.   Chelsea Felkins, MD 03/10/2015

## 2015-03-10 NOTE — Patient Instructions (Signed)
Begin taking the amlodipine that Dr. Carlota Raspberry gave you.  See the cardiologist as soon as we can get you an.  Return sooner if problems or concerns

## 2015-03-13 ENCOUNTER — Other Ambulatory Visit: Payer: Self-pay | Admitting: *Deleted

## 2015-03-13 NOTE — Patient Outreach (Addendum)
[facility]) Care Management  03/13/2015  Chelsea Browning 1932/11/27 [id number]   Assessment: Care coordination Referral received from telephonic care management coordinator (R. Florance) for in-home evaluation and assessment of care needs for blood pressure management. Call placed to patient and was able to speak to her briefly. Care management coordinator introduced self and explained purpose of the call. Patient states she is aware of the referral.  Patient informs care management coordinator that she is on another line for a long distance call at the moment and asked care management coordinator to call back.    Plan: Will attempt to call back later per patient's request.   Addendum: Called back per patient's request and was able to talk to her in a short period of time since she had company (sister from [place]) just arrived as stated. Patient reports she has an appointment to see a cardiologist (Dr. Tollie Eth) on 03/16/15 at 3 pm. Transportation per spouse.  She states that she just started on Amlodipine as ordered.  Patient agreed to initial home visit this month and requests to call her first before the scheduled home visit. Encouraged patient to call [facility], care management coordinator or 24-hour nurse line if necessary. Name and contact information provided.   Plan: Initial home visit on 04/02/15. Call first per patient's request. Will provide [facility] Hypertension packet to patient. Will obtain written consent of patient.   Chelsea Browning, BSN, RN-BC [place] Management Coordinator Cell: [phone]

## 2015-03-15 ENCOUNTER — Other Ambulatory Visit: Payer: Self-pay | Admitting: Physician Assistant

## 2015-03-16 ENCOUNTER — Ambulatory Visit: Payer: Commercial Managed Care - HMO | Admitting: Internal Medicine

## 2015-03-18 ENCOUNTER — Other Ambulatory Visit: Payer: Self-pay

## 2015-03-18 NOTE — Patient Outreach (Signed)
I called to set up a home visit to review Chelsea Browning's medications.  I had to leave a HIPPA compliant message for her to call me back.  I will try again in a few days.    Deanne Coffer, PharmD, [place] [phone]

## 2015-03-26 ENCOUNTER — Ambulatory Visit (HOSPITAL_BASED_OUTPATIENT_CLINIC_OR_DEPARTMENT_OTHER): Payer: Commercial Managed Care - HMO | Admitting: Hematology

## 2015-03-26 ENCOUNTER — Telehealth: Payer: Self-pay | Admitting: Hematology

## 2015-03-26 ENCOUNTER — Other Ambulatory Visit (HOSPITAL_BASED_OUTPATIENT_CLINIC_OR_DEPARTMENT_OTHER): Payer: Commercial Managed Care - HMO

## 2015-03-26 ENCOUNTER — Encounter: Payer: Self-pay | Admitting: Hematology

## 2015-03-26 VITALS — BP 170/54 | HR 57 | Temp 97.8°F | Resp 18 | Ht 63.0 in | Wt 225.1 lb

## 2015-03-26 DIAGNOSIS — D649 Anemia, unspecified: Secondary | ICD-10-CM

## 2015-03-26 DIAGNOSIS — E871 Hypo-osmolality and hyponatremia: Secondary | ICD-10-CM

## 2015-03-26 DIAGNOSIS — D638 Anemia in other chronic diseases classified elsewhere: Secondary | ICD-10-CM | POA: Diagnosis not present

## 2015-03-26 DIAGNOSIS — I10 Essential (primary) hypertension: Secondary | ICD-10-CM

## 2015-03-26 DIAGNOSIS — D6489 Other specified anemias: Secondary | ICD-10-CM

## 2015-03-26 DIAGNOSIS — M1611 Unilateral primary osteoarthritis, right hip: Secondary | ICD-10-CM

## 2015-03-26 DIAGNOSIS — D472 Monoclonal gammopathy: Secondary | ICD-10-CM | POA: Diagnosis not present

## 2015-03-26 DIAGNOSIS — D801 Nonfamilial hypogammaglobulinemia: Secondary | ICD-10-CM | POA: Diagnosis not present

## 2015-03-26 LAB — CBC & DIFF AND RETIC
BASO%: 0.2 % (ref 0.0–2.0)
BASOS ABS: 0 10*3/uL (ref 0.0–0.1)
EOS%: 1.1 % (ref 0.0–7.0)
Eosinophils Absolute: 0.1 10*3/uL (ref 0.0–0.5)
HEMATOCRIT: 29.9 % — AB (ref 34.8–46.6)
HGB: 10.3 g/dL — ABNORMAL LOW (ref 11.6–15.9)
IMMATURE RETIC FRACT: 3.2 % (ref 1.60–10.00)
LYMPH#: 0.6 10*3/uL — AB (ref 0.9–3.3)
LYMPH%: 11.1 % — ABNORMAL LOW (ref 14.0–49.7)
MCH: 31.5 pg (ref 25.1–34.0)
MCHC: 34.4 g/dL (ref 31.5–36.0)
MCV: 91.4 fL (ref 79.5–101.0)
MONO#: 0.5 10*3/uL (ref 0.1–0.9)
MONO%: 9.4 % (ref 0.0–14.0)
NEUT#: 4.3 10*3/uL (ref 1.5–6.5)
NEUT%: 78.2 % — ABNORMAL HIGH (ref 38.4–76.8)
Platelets: 188 10*3/uL (ref 145–400)
RBC: 3.27 10*6/uL — AB (ref 3.70–5.45)
RDW: 14.2 % (ref 11.2–14.5)
RETIC %: 1.8 % (ref 0.70–2.10)
RETIC CT ABS: 58.86 10*3/uL (ref 33.70–90.70)
WBC: 5.5 10*3/uL (ref 3.9–10.3)

## 2015-03-26 LAB — COMPREHENSIVE METABOLIC PANEL
ALT: 17 U/L (ref 0–55)
AST: 20 U/L (ref 5–34)
Albumin: 3.9 g/dL (ref 3.5–5.0)
Alkaline Phosphatase: 113 U/L (ref 40–150)
Anion Gap: 7 mEq/L (ref 3–11)
BUN: 24.5 mg/dL (ref 7.0–26.0)
CHLORIDE: 100 meq/L (ref 98–109)
CO2: 22 mEq/L (ref 22–29)
Calcium: 9.6 mg/dL (ref 8.4–10.4)
Creatinine: 0.9 mg/dL (ref 0.6–1.1)
EGFR: 64 mL/min/{1.73_m2} — ABNORMAL LOW (ref >90)
GLUCOSE: 95 mg/dL (ref 70–140)
POTASSIUM: 4.7 meq/L (ref 3.5–5.1)
SODIUM: 128 meq/L — AB (ref 136–145)
Total Bilirubin: 0.53 mg/dL (ref 0.20–1.20)
Total Protein: 6.6 g/dL (ref 6.4–8.3)

## 2015-03-26 LAB — FERRITIN: Ferritin: 353 ng/ml — ABNORMAL HIGH (ref 9–269)

## 2015-03-26 NOTE — Telephone Encounter (Signed)
cld & spoke to pt and adv of next appt for 4/17
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
## 2015-03-30 ENCOUNTER — Other Ambulatory Visit: Payer: Self-pay | Admitting: Pharmacist

## 2015-03-30 NOTE — Patient Outreach (Signed)
Chelsea Browning is a 82 y.o. female referred to pharmacy for medication assistance and medication review. Left a HIPAA compliant message with the patient's husband. [name] suggests that I try back Wednesday morning. Will give the patient another call at that time.  Harlow Asa, PharmD Clinical Pharmacist [place] Management [phone]

## 2015-03-31 ENCOUNTER — Ambulatory Visit (INDEPENDENT_AMBULATORY_CARE_PROVIDER_SITE_OTHER): Payer: Commercial Managed Care - HMO | Admitting: Family Medicine

## 2015-03-31 ENCOUNTER — Other Ambulatory Visit: Payer: Commercial Managed Care - HMO

## 2015-03-31 ENCOUNTER — Ambulatory Visit: Payer: Commercial Managed Care - HMO | Admitting: Family Medicine

## 2015-03-31 VITALS — BP 144/64 | HR 62 | Temp 97.7°F | Resp 17 | Ht 63.0 in | Wt 226.0 lb

## 2015-03-31 DIAGNOSIS — D509 Iron deficiency anemia, unspecified: Secondary | ICD-10-CM | POA: Diagnosis not present

## 2015-03-31 DIAGNOSIS — E785 Hyperlipidemia, unspecified: Secondary | ICD-10-CM

## 2015-03-31 DIAGNOSIS — I10 Essential (primary) hypertension: Secondary | ICD-10-CM | POA: Diagnosis not present

## 2015-03-31 NOTE — Patient Instructions (Signed)
Continue your blood pressure medicines as per Dr. Wynonia Lawman  I will let you know the results of your labs  Return in April, sooner if problems  Continue trying to get regular exercise  Decrease your eating to try and lose weight as previously discussed  Return anytime if needed or if problems.

## 2015-03-31 NOTE — Progress Notes (Signed)
Patient ID: Chelsea Browning, female    DOB: July 29, 1932  Age: 82 y.o. MRN: [id number]  Chief Complaint  Patient presents with  . Follow-up    HBP     Subjective:   Patient saw Dr. Wynonia Lawman. He changed her blood pressure medication increasing amlodipine to twice a day now. He encouraged her to lose weight. She had an echocardiogram which looked fairly satisfactory, may be some mild cardiomegaly. She is feeling well and just came off for a checkup.  Current allergies, medications, problem list, past/family and social histories reviewed.  Objective:  BP 144/64 mmHg  Pulse 62  Temp(Src) 97.7 F (36.5 C) (Oral)  Resp 17  Ht 5\' 3"  (1.6 m)  Wt 226 lb (102.513 kg)  BMI 40.04 kg/m2  SpO2 97%  Repeat blood pressure was 144/64. She is alert and oriented. Chest clear. Heart regular without murmur. No ankle edema.  Assessment & Plan:   Assessment: 1. Anemia, iron deficiency   2. HLD (hyperlipidemia)   3. Hyperlipemia   4. Essential hypertension       Plan: Told her my biggest caution would be to make sure the amlodipine does not cause Alexis are swollen again too much. She understands that. She has some other labs needed by her hematologist which will be done today for the anemia. She also has not had her lipid panel done a long time so we are checking that.  Orders Placed This Encounter  Procedures  . Lipid panel  . CBC with Differential/Platelet  . Reticulocytes  . Ferritin    No orders of the defined types were placed in this encounter.         Patient Instructions  Continue your blood pressure medicines as per Dr. Wynonia Lawman  I will let you know the results of your labs  Return in April, sooner if problems  Continue trying to get regular exercise  Decrease your eating to try and lose weight as previously discussed  Return anytime if needed or if problems.     Return in about 6 weeks (around 05/12/2015), or if symptoms worsen or fail to improve.   Previn Jian,  MD 03/31/2015

## 2015-04-01 ENCOUNTER — Other Ambulatory Visit: Payer: Self-pay | Admitting: Pharmacist

## 2015-04-01 ENCOUNTER — Ambulatory Visit: Payer: Self-pay | Admitting: *Deleted

## 2015-04-01 NOTE — Patient Outreach (Signed)
[facility]) Care Management  [place]   04/01/2015  Caris Honold 08-25-1932 [id number]  Subjective: Chelsea Browning is a 82 y.o. female referred to pharmacy for medication assistance and medication review. Reviewed patient's allergy, medication and problem list in order to perform this evaluation.  Called and spoke with Ms. Armendariz who reports that that she currently gets her medications from [facility] and that the amlodipine and losartan are more expensive to her. Note that patient has Medicare coverage with [employer]. Note that per the [place] website, both amlodipine and losartan should be tier 1 medications for the patient, which should be free if obtained through [employer]. Let patient know about [employer] Mail order for cost savings and provide her with the phone number. Patient expresses appreciation for this information. Patient states that she is familiar with using mail order and that she will call to get this set up.  Patient states that she will call me in the future with questions about her medications. Provide her with my phone number.  Objective:   Current Medications: Current Outpatient Prescriptions  Medication Sig Dispense Refill  . amLODipine (NORVASC) 5 MG tablet Take 5 mg by mouth daily.    Marland Kitchen losartan (COZAAR) 50 MG tablet Take 1 tablet (50 mg total) by mouth daily. 30 tablet 1  . acetaminophen (TYLENOL) 500 MG tablet Take 1,000 mg by mouth every 6 (six) hours as needed for moderate pain.    Marland Kitchen atorvastatin (LIPITOR) 40 MG tablet TAKE 1 TABLET ONCE DAILY. 30 tablet 0  . citalopram (CELEXA) 10 MG tablet Take 1 tablet (10 mg total) by mouth daily. 30 tablet 30  . hydrOXYzine (ATARAX/VISTARIL) 10 MG tablet Take one every 6 or 8 hours if needed for itching (Patient not taking: Reported on 03/31/2015) 60 tablet 2  . labetalol (NORMODYNE) 200 MG tablet Take 1 tablet (200 mg total) by mouth 3 (three) times daily. 270 tablet 3  .  omeprazole (PRILOSEC) 20 MG capsule Take 1 capsule by mouth before breakfast daily. 90 capsule 3  . Triamcinolone Acetonide (TRIAMCINOLONE 0.1 % CREAM : EUCERIN) CREA Use once or twice daily on the legs 1 each 3  . triamcinolone cream (KENALOG) 0.1 % APPLY TO AFFECTED AREAS ON LEGS ONCE OR TWICE DAILY. 120 g 0   No current facility-administered medications for this visit.    Assessment:  Drugs sorted by system:  Neurologic/Psychologic: citalopram  Cardiovascular: amlodipine, atorvastatin, labetalol, losartan  Gastrointestinal: omeprazole  Topical: triamcinolone cream  Pain: acetaminophen   Duplications in therapy: none noted Gaps in therapy: none noted Medications to avoid in the elderly: none noted Drug interactions: no significant drug interactions noted  Plan:  1) Patient to follow up with [facility] for cost savings.  2) No significant medication issues noted from this medication review. Will close pharmacy episode at this time.  Harlow Asa, PharmD Clinical Pharmacist [place] Management [phone]

## 2015-04-02 ENCOUNTER — Encounter: Payer: Self-pay | Admitting: *Deleted

## 2015-04-02 ENCOUNTER — Other Ambulatory Visit (INDEPENDENT_AMBULATORY_CARE_PROVIDER_SITE_OTHER): Payer: Commercial Managed Care - HMO | Admitting: Family Medicine

## 2015-04-02 ENCOUNTER — Other Ambulatory Visit: Payer: Self-pay | Admitting: *Deleted

## 2015-04-02 DIAGNOSIS — I10 Essential (primary) hypertension: Secondary | ICD-10-CM | POA: Diagnosis not present

## 2015-04-02 DIAGNOSIS — E785 Hyperlipidemia, unspecified: Secondary | ICD-10-CM | POA: Diagnosis not present

## 2015-04-02 DIAGNOSIS — D509 Iron deficiency anemia, unspecified: Secondary | ICD-10-CM | POA: Diagnosis not present

## 2015-04-02 LAB — RETICULOCYTES
ABS Retic: 48 10*3/uL (ref 19.0–186.0)
RBC.: 3.2 MIL/uL — ABNORMAL LOW (ref 3.87–5.11)
Retic Ct Pct: 1.5 % (ref 0.4–2.3)

## 2015-04-02 LAB — CBC WITH DIFFERENTIAL/PLATELET
Basophils Absolute: 0 10*3/uL (ref 0.0–0.1)
Basophils Relative: 0 % (ref 0–1)
Eosinophils Absolute: 0.1 10*3/uL (ref 0.0–0.7)
Eosinophils Relative: 3 % (ref 0–5)
HCT: 29.3 % — ABNORMAL LOW (ref 36.0–46.0)
HEMOGLOBIN: 9.9 g/dL — AB (ref 12.0–15.0)
LYMPHS ABS: 0.7 10*3/uL (ref 0.7–4.0)
Lymphocytes Relative: 16 % (ref 12–46)
MCH: 30.9 pg (ref 26.0–34.0)
MCHC: 33.8 g/dL (ref 30.0–36.0)
MCV: 91.6 fL (ref 78.0–100.0)
MONO ABS: 0.4 10*3/uL (ref 0.1–1.0)
MONOS PCT: 8 % (ref 3–12)
MPV: 8.6 fL (ref 8.6–12.4)
NEUTROS ABS: 3.2 10*3/uL (ref 1.7–7.7)
NEUTROS PCT: 73 % (ref 43–77)
Platelets: 208 10*3/uL (ref 150–400)
RBC: 3.2 MIL/uL — ABNORMAL LOW (ref 3.87–5.11)
RDW: 15.2 % (ref 11.5–15.5)
WBC: 4.4 10*3/uL (ref 4.0–10.5)

## 2015-04-02 LAB — FERRITIN: FERRITIN: 331 ng/mL — AB (ref 20–288)

## 2015-04-02 NOTE — Patient Outreach (Signed)
[facility]) Care Management   04/02/2015  Chelsea Browning 1932-03-24 [id number]  Chelsea Browning is an 82 y.o. female  Subjective: Patient verbalized "feeling pretty good". She reports going back to primary care provider's office later today for blood draw which was not done on last office visit (2/21) due to difficulty to draw her blood.  Patient voiced out discomfort to right hip with movements or when putting weight to it.  Objective: BP 150/74 mmHg  Pulse 59  Resp 18  SpO2 97%  Review of Systems  Constitutional: Positive for weight loss.       Desirable weight loss as ordered by cardiologist and primary care provider  HENT: Negative.   Eyes: Negative.        Uses reading glasses  Respiratory: Negative for cough, sputum production and wheezing.        Respirations even and unlabored Slight short of breath with exertion- noted noise with respirations when gets tired Clear to auscultation  Cardiovascular: Positive for leg swelling. Negative for chest pain.       Regular rate and rhythm  Hard edema to bilateral lower extremities- reports using compression stockings and compression boots  Red discoloration/ rashes to bilateral shin- patient reports having history of cellulitis to lower extremities treated with Cephalexin  Gastrointestinal: Negative.  Negative for diarrhea and constipation.       Bowel sounds present Abdomen obese, non-tender and soft  Genitourinary: Negative.   Musculoskeletal: Positive for joint pain. Negative for falls.       Right hip pain with movements- needing right hip replacement- but on hold due to blood pressure issues  Skin: Negative.   Neurological: Negative.   Endo/Heme/Allergies: Bruises/bleeds easily.       Bruised spots to bilateral arms (due to several attempts of drawing blood per patient)  Psychiatric/Behavioral: Negative.     Physical Exam  Current Medications:   Current Outpatient Prescriptions  Medication Sig  Dispense Refill  . acetaminophen (TYLENOL) 500 MG tablet Take 1,000 mg by mouth every 6 (six) hours as needed for moderate pain.    Marland Kitchen amLODipine (NORVASC) 5 MG tablet Take 5 mg by mouth daily.    Marland Kitchen atorvastatin (LIPITOR) 40 MG tablet TAKE 1 TABLET ONCE DAILY. 30 tablet 0  . dipyridamole (PERSANTINE) 75 MG tablet Take 75 mg by mouth 2 (two) times daily.    Marland Kitchen HYDROcodone-acetaminophen (NORCO/VICODIN) 5-325 MG tablet Take 1 tablet by mouth 2 (two) times daily as needed for moderate pain or severe pain.    Marland Kitchen labetalol (NORMODYNE) 200 MG tablet Take 1 tablet (200 mg total) by mouth 3 (three) times daily. 270 tablet 3  . losartan (COZAAR) 50 MG tablet Take 1 tablet (50 mg total) by mouth daily. 30 tablet 1  . omeprazole (PRILOSEC) 20 MG capsule Take 1 capsule by mouth before breakfast daily. 90 capsule 3  . Triamcinolone Acetonide (TRIAMCINOLONE 0.1 % CREAM : EUCERIN) CREA Use once or twice daily on the legs 1 each 3  . triamcinolone cream (KENALOG) 0.1 % APPLY TO AFFECTED AREAS ON LEGS ONCE OR TWICE DAILY. 120 g 0  . citalopram (CELEXA) 10 MG tablet Take 1 tablet (10 mg total) by mouth daily. (Patient not taking: Reported on 04/02/2015) 30 tablet 30  . hydrOXYzine (ATARAX/VISTARIL) 10 MG tablet Take one every 6 or 8 hours if needed for itching (Patient not taking: Reported on 03/31/2015) 60 tablet 2   No current facility-administered medications for this visit.    Functional Status:  In your present state of health, do you have any difficulty performing the following activities: 04/02/2015  Hearing? N  Vision? N  Difficulty concentrating or making decisions? N  Walking or climbing stairs? Y  Dressing or bathing? N  Doing errands, shopping? Y  Preparing Food and eating ? N  Using the Toilet? N  In the past six months, have you accidently leaked urine? N  Do you have problems with loss of bowel control? N  Managing your Medications? N  Managing your Finances? N  Housekeeping or managing your  Housekeeping? Y    Fall/Depression Screening:    PHQ 2/9 Scores 04/02/2015 03/31/2015 03/10/2015 03/05/2015 03/03/2015 02/27/2015 01/12/2015  PHQ - 2 Score 0 0 0 0 0 0 0    Assessment:   82 year old female seen for in home evaluation and assessment of care needs and blood pressure management. Arrived at patient's 2- bedroom apartment. Patient and husband lives on the first floor of an apartment complex.  Patient reports that she is by herself since husband is at the dentist office for denture impressions.  Patient states moving from [place] to  about three years ago to be closer to her grandchildren. She is independent with self care. She relies on husband or daughter to take her to doctors' appointments. At times, she brings herself to appointments using the [employer] bus as stated. Discussed with patient regarding her benefits for [facility] transportation to her doctor's appointments in case she needs it. Patient reports having a daughter Chartered loss adjuster) in [place] who is supportive of her care as well.  Patient reports she has [employer] services that comes in to assist her when her family is out of town. Patient also reports having private pay housekeeper that cleans in her apartment once a month. Patient reports being extra cautious and denies any fall episodes. She moves around using a rollator walker. Patient states she is attending outpatient rehabilitation at [facility] office 3 times a week (M-W-F) which will end on 04/06/15 then plans to continue exercises at [facility]. Information provided regarding PREP (Physician Referred Exercise Program) and [facility] coordinator who conducts this program. Patient expressed interest in this exercise program after finishing outpatient therapy. She states needing a right hip replacement but surgery is on hold right now related to on going blood pressure issues.  She now has her own BP device that she bought which she uses to check her blood pressure. [facility]  notebook/ calendar provided for patient to record results. She reported blood pressure readings as high as 185- 190 systolic, however, with new medication prescriptions (Amlodipine and Losartan) blood pressure readings has improved. She was referred by her primary care provider (Dr. Alwyn Ren) to a cardiologist (Dr. Donnie Aho) whom she saw on 2/6. Echocardiogram was done on 2/13 with normal results called by cardiologist to patient as reported. [facility] hypertension packet provided and discussed. Low salt food list was given to patient as well.  She states being symptomatic (dizziness) at times with elevated BP. Encouraged patient to alert MD for elevated readings and to bring blood pressure list on follow-up visit to be reviewed by provider. Patient voices adhering to low salt, low fat diet. Patient is able to verbalize some signs and symptoms of elevated BP as well as signs and symptoms of heart attack and stroke.  Medication assistance being provided by [facility] pharmacy who contacted her yesterday as stated. She reports managing her own medications using a pillbox.  Patient has a follow-up appointment with  primary care provider on 05/07/15 and will then be transitioned to Dr. Abner Greenspan. She has also been seen by a hematologist (Dr. Candise Che) for her anemia and receives iron infusions regularly as reported. Follow-up appointment with Dr. Candise Che is scheduled on 4/17.   Written consent for [facility] services obtained from patient on this visit. Patient denies any other needs or concerns at this time. She agreed to a routine home visit next month. Contact information provided to patient and encouraged to call [facility] management coordinator or 24-hour nurse line as necessary.    Plan: Routine home visit on 04/27/15   [facility] CM Care Plan Problem One        Most Recent Value   Care Plan Problem One  knowledge deficit regarding hypertension management   Role Documenting the Problem One  Care Management Coordinator   Care Plan for Problem  One  Active   [facility] Long Term Goal (31-90 days)  patient will report managing blood pressure as evidenced by blood pressure readings 150 and below in the next 60 days   [facility] Long Term Goal Start Date  04/02/15   Interventions for Problem One Long Term Goal  attend follow-up visit with cardiologist as referred by primary care provider,  encouraged patient to check blood pressure daily and record,  provided [facility] notebook/calendar for patient to use,  encourage medication adherence (new prescription for Amlodipine and Losartan),  [facility] packet for hypertension provided and discussed,  food list for low salt diet provided,  emphasize adherence to heart healthy diet,  encourage to continue attending outpatient therapy/ rehabilitation at [facility] office   [facility] CM Short Term Goal #1 (0-30 days)  patient will monitor blood pressure daily and record in the next 30 days   [facility] CM Short Term Goal #1 Start Date  04/02/15   Interventions for Short Term Goal #1  encourage patient to check blood pressure daily and record,  provide [facility] notebook/ calendar to use,  encourage patient to bring list of blood pressure readings to provider's follow-up visit for review   [facility] CM Short Term Goal #2 (0-30 days)  patient will report medication adherence to (new) blood pressure medications ordered in the next 30 days   [facility] CM Short Term Goal #2 Start Date  04/02/15   Interventions for Short Term Goal #2  medication review done, confirm medication supplies are available for patient,  encourage to obtain medications (Amlodipine and Losartan) from [facility] mail order for cost savings per [facility] pharmacy,  discuss importance of adherence to medications to manage blood pressure     [facility] CM Short Term Goal #3 (0-30 days)  patient will report attending exercise classes at least twice a week in the next 30 days   [facility] CM Short Term Goal #3 Start Date  04/02/15   Interventions for Short Tern Goal #3  encourage patient to continue attending  outpatient therapy/ rehabilitation (exercises) at [facility] offfice,  provided information regarding PREP exercise program (with [facility] coordinator) from [facility] to continue exercises once finished with outpatient therapy,  encourage patient to continue volunteer work with [facility] to have activities to do,  confirm availability of transportation     [place] A. Chelsea Browning, BSN, RN-BC [facility] Care Management Coordinator Cell: [phone]

## 2015-04-03 ENCOUNTER — Encounter: Payer: Self-pay | Admitting: *Deleted

## 2015-04-03 LAB — LIPID PANEL
CHOLESTEROL: 198 mg/dL (ref 125–200)
HDL: 93 mg/dL (ref >=46)
LDL CALC: 96 mg/dL (ref <130)
TRIGLYCERIDES: 47 mg/dL (ref <150)
Total CHOL/HDL Ratio: 2.1 Ratio (ref <=5.0)
VLDL: 9 mg/dL (ref <30)

## 2015-04-07 ENCOUNTER — Encounter: Payer: Self-pay | Admitting: Family Medicine

## 2015-04-09 ENCOUNTER — Telehealth: Payer: Self-pay | Admitting: *Deleted

## 2015-04-09 NOTE — Telephone Encounter (Signed)
"  I would like to speak to the nurse of Dr. Irene Limbo.  There is a difference in the level of my HCT per my PCP.  Please call so I can get this straightened out.  Return umber [phone]."

## 2015-04-09 NOTE — Telephone Encounter (Signed)
Spoke with patient and discussed labs 

## 2015-04-27 ENCOUNTER — Other Ambulatory Visit: Payer: Self-pay | Admitting: *Deleted

## 2015-04-27 ENCOUNTER — Other Ambulatory Visit: Payer: Self-pay | Admitting: Physician Assistant

## 2015-04-27 ENCOUNTER — Telehealth: Payer: Self-pay

## 2015-04-27 ENCOUNTER — Encounter: Payer: Self-pay | Admitting: *Deleted

## 2015-04-27 MED ORDER — ATORVASTATIN CALCIUM 40 MG PO TABS
40.0000 mg | ORAL_TABLET | Freq: Every day | ORAL | Status: DC
Start: 2015-04-27 — End: 2015-07-30

## 2015-04-27 NOTE — Telephone Encounter (Signed)
Rx sent 

## 2015-04-27 NOTE — Patient Outreach (Addendum)
[facility] ([facility]) Care Management   04/27/2015  Chelsea Browning 04/23/1932 [id number]  Chelsea Browning is an 82 y.o. female  Subjective: Patient verbalized that she is "doing good except for right hip pain with movements/ walking". She reports taking 2 Tylenol tablets to help ease the pain.   Objective:  BP 142/60 mmHg  Pulse 66  Resp 20  SpO2 98%  Review of Systems  Constitutional: Negative.   Eyes: Negative.        Wears glasses for reading  Respiratory: Negative.  Negative for cough, sputum production and wheezing.        Lung sounds clear to auscultation Respirations even and unlabored  Cardiovascular: Positive for leg swelling.       Regular rate and rhythm Slight swelling and non-pitting edema to bilateral legs/ankles   Gastrointestinal:       Obese abdomen soft and non-tender Present bowel sounds  Genitourinary:       Reports some urine leakage- wears pads at nights  Musculoskeletal: Positive for joint pain and falls.       History of fall Right hip pain with movement/ walking- needing right hip replacement- but on hold due to blood pressure issues    Skin:       Bilateral leg/ shin with slight redness (right greater than left)  Neurological: Negative.   Endo/Heme/Allergies: Negative.   Psychiatric/Behavioral: Negative.     Physical Exam  Current Medications:   Current Outpatient Prescriptions  Medication Sig Dispense Refill  . acetaminophen (TYLENOL) 500 MG tablet Take 1,000 mg by mouth every 6 (six) hours as needed for moderate pain.    . amLODipine (NORVASC) 5 MG tablet Take 5 mg by mouth daily.    . atorvastatin (LIPITOR) 40 MG tablet TAKE 1 TABLET ONCE DAILY. 30 tablet 0  . dipyridamole (PERSANTINE) 75 MG tablet Take 75 mg by mouth 2 (two) times daily.    . HYDROcodone-acetaminophen (NORCO/VICODIN) 5-325 MG tablet Take 1 tablet by mouth 2 (two) times daily as needed for moderate pain or severe pain. Reported on 04/27/2015    . labetalol  (NORMODYNE) 200 MG tablet Take 1 tablet (200 mg total) by mouth 3 (three) times daily. 270 tablet 3  . losartan (COZAAR) 50 MG tablet Take 1 tablet (50 mg total) by mouth daily. 30 tablet 1  . omeprazole (PRILOSEC) 20 MG capsule Take 1 capsule by mouth before breakfast daily. 90 capsule 3  . spironolactone (ALDACTONE) 25 MG tablet Take 25 mg by mouth daily.    . Triamcinolone Acetonide (TRIAMCINOLONE 0.1 % CREAM : EUCERIN) CREA Use once or twice daily on the legs 1 each 3  . triamcinolone cream (KENALOG) 0.1 % APPLY TO AFFECTED AREAS ON LEGS ONCE OR TWICE DAILY. 120 g 0  . citalopram (CELEXA) 10 MG tablet Take 1 tablet (10 mg total) by mouth daily. (Patient not taking: Reported on 04/02/2015) 30 tablet 30  . hydrOXYzine (ATARAX/VISTARIL) 10 MG tablet Take one every 6 or 8 hours if needed for itching (Patient not taking: Reported on 03/31/2015) 60 tablet 2   No current facility-administered medications for this visit.    Functional Status:   In your present state of health, do you have any difficulty performing the following activities: 04/27/2015 04/02/2015  Hearing? N N  Vision? N N  Difficulty concentrating or making decisions? N N  Walking or climbing stairs? Y Y  Dressing or bathing? N N  Doing errands, shopping? Y Y  Preparing Food and eating ?   [facility]) Care Management   04/27/2015  [name] 1932-04-08 [id number]  Chelsea Browning is an 82 y.o. female  Subjective: Patient verbalized that she is "doing good except for right hip pain with movements/ walking". She reports taking 2 Tylenol tablets to help ease the pain.   Objective:  BP 142/60 mmHg  Pulse 66  Resp 20  SpO2 98%  Review of Systems  Constitutional: Negative.   Eyes: Negative.        Wears glasses for reading  Respiratory: Negative.  Negative for cough, sputum production and wheezing.        Lung sounds clear to auscultation Respirations even and unlabored  Cardiovascular: Positive for leg swelling.       Regular rate and rhythm Slight swelling and non-pitting edema to bilateral legs/ankles   Gastrointestinal:       Obese abdomen soft and non-tender Present bowel sounds  Genitourinary:       Reports some urine leakage- wears pads at nights  Musculoskeletal: Positive for joint pain and falls.       History of fall Right hip pain with movement/ walking- needing right hip replacement- but on hold due to blood pressure issues    Skin:       Bilateral leg/ shin with slight redness (right greater than left)  Neurological: Negative.   Endo/Heme/Allergies: Negative.   Psychiatric/Behavioral: Negative.     Physical Exam  Current Medications:   Current Outpatient Prescriptions  Medication Sig Dispense Refill  . acetaminophen (TYLENOL) 500 MG tablet Take 1,000 mg by mouth every 6 (six) hours as needed for moderate pain.    Marland Kitchen amLODipine (NORVASC) 5 MG tablet Take 5 mg by mouth daily.    Marland Kitchen atorvastatin (LIPITOR) 40 MG tablet TAKE 1 TABLET ONCE DAILY. 30 tablet 0  . dipyridamole (PERSANTINE) 75 MG tablet Take 75 mg by mouth 2 (two) times daily.    Marland Kitchen HYDROcodone-acetaminophen (NORCO/VICODIN) 5-325 MG tablet Take 1 tablet by mouth 2 (two) times daily as needed for moderate pain or severe pain. Reported on 04/27/2015    . labetalol  (NORMODYNE) 200 MG tablet Take 1 tablet (200 mg total) by mouth 3 (three) times daily. 270 tablet 3  . losartan (COZAAR) 50 MG tablet Take 1 tablet (50 mg total) by mouth daily. 30 tablet 1  . omeprazole (PRILOSEC) 20 MG capsule Take 1 capsule by mouth before breakfast daily. 90 capsule 3  . spironolactone (ALDACTONE) 25 MG tablet Take 25 mg by mouth daily.    . Triamcinolone Acetonide (TRIAMCINOLONE 0.1 % CREAM : EUCERIN) CREA Use once or twice daily on the legs 1 each 3  . triamcinolone cream (KENALOG) 0.1 % APPLY TO AFFECTED AREAS ON LEGS ONCE OR TWICE DAILY. 120 g 0  . citalopram (CELEXA) 10 MG tablet Take 1 tablet (10 mg total) by mouth daily. (Patient not taking: Reported on 04/02/2015) 30 tablet 30  . hydrOXYzine (ATARAX/VISTARIL) 10 MG tablet Take one every 6 or 8 hours if needed for itching (Patient not taking: Reported on 03/31/2015) 60 tablet 2   No current facility-administered medications for this visit.    Functional Status:   In your present state of health, do you have any difficulty performing the following activities: 04/27/2015 04/02/2015  Hearing? N N  Vision? N N  Difficulty concentrating or making decisions? N N  Walking or climbing stairs? Y Y  Dressing or bathing? N N  Doing errands, shopping? Tempie Donning  Preparing Food and eating ?  [facility]) Care Management   04/27/2015  [name] 1932-04-08 [id number]  Chelsea Browning is an 82 y.o. female  Subjective: Patient verbalized that she is "doing good except for right hip pain with movements/ walking". She reports taking 2 Tylenol tablets to help ease the pain.   Objective:  BP 142/60 mmHg  Pulse 66  Resp 20  SpO2 98%  Review of Systems  Constitutional: Negative.   Eyes: Negative.        Wears glasses for reading  Respiratory: Negative.  Negative for cough, sputum production and wheezing.        Lung sounds clear to auscultation Respirations even and unlabored  Cardiovascular: Positive for leg swelling.       Regular rate and rhythm Slight swelling and non-pitting edema to bilateral legs/ankles   Gastrointestinal:       Obese abdomen soft and non-tender Present bowel sounds  Genitourinary:       Reports some urine leakage- wears pads at nights  Musculoskeletal: Positive for joint pain and falls.       History of fall Right hip pain with movement/ walking- needing right hip replacement- but on hold due to blood pressure issues    Skin:       Bilateral leg/ shin with slight redness (right greater than left)  Neurological: Negative.   Endo/Heme/Allergies: Negative.   Psychiatric/Behavioral: Negative.     Physical Exam  Current Medications:   Current Outpatient Prescriptions  Medication Sig Dispense Refill  . acetaminophen (TYLENOL) 500 MG tablet Take 1,000 mg by mouth every 6 (six) hours as needed for moderate pain.    Marland Kitchen amLODipine (NORVASC) 5 MG tablet Take 5 mg by mouth daily.    Marland Kitchen atorvastatin (LIPITOR) 40 MG tablet TAKE 1 TABLET ONCE DAILY. 30 tablet 0  . dipyridamole (PERSANTINE) 75 MG tablet Take 75 mg by mouth 2 (two) times daily.    Marland Kitchen HYDROcodone-acetaminophen (NORCO/VICODIN) 5-325 MG tablet Take 1 tablet by mouth 2 (two) times daily as needed for moderate pain or severe pain. Reported on 04/27/2015    . labetalol  (NORMODYNE) 200 MG tablet Take 1 tablet (200 mg total) by mouth 3 (three) times daily. 270 tablet 3  . losartan (COZAAR) 50 MG tablet Take 1 tablet (50 mg total) by mouth daily. 30 tablet 1  . omeprazole (PRILOSEC) 20 MG capsule Take 1 capsule by mouth before breakfast daily. 90 capsule 3  . spironolactone (ALDACTONE) 25 MG tablet Take 25 mg by mouth daily.    . Triamcinolone Acetonide (TRIAMCINOLONE 0.1 % CREAM : EUCERIN) CREA Use once or twice daily on the legs 1 each 3  . triamcinolone cream (KENALOG) 0.1 % APPLY TO AFFECTED AREAS ON LEGS ONCE OR TWICE DAILY. 120 g 0  . citalopram (CELEXA) 10 MG tablet Take 1 tablet (10 mg total) by mouth daily. (Patient not taking: Reported on 04/02/2015) 30 tablet 30  . hydrOXYzine (ATARAX/VISTARIL) 10 MG tablet Take one every 6 or 8 hours if needed for itching (Patient not taking: Reported on 03/31/2015) 60 tablet 2   No current facility-administered medications for this visit.    Functional Status:   In your present state of health, do you have any difficulty performing the following activities: 04/27/2015 04/02/2015  Hearing? N N  Vision? N N  Difficulty concentrating or making decisions? N N  Walking or climbing stairs? Y Y  Dressing or bathing? N N  Doing errands, shopping? Tempie Donning  Preparing Food and eating ?

## 2015-04-27 NOTE — Telephone Encounter (Signed)
PT req. Refill on   Original Order:  atorvastatin (LIPITOR) 40 MG tablet [id number]        Pharmacy:  [place], [address].        [phone]

## 2015-05-07 ENCOUNTER — Ambulatory Visit: Payer: Commercial Managed Care - HMO | Admitting: Family Medicine

## 2015-05-10 ENCOUNTER — Ambulatory Visit (INDEPENDENT_AMBULATORY_CARE_PROVIDER_SITE_OTHER): Payer: Commercial Managed Care - HMO | Admitting: Family Medicine

## 2015-05-10 VITALS — BP 138/70 | HR 61 | Temp 97.7°F | Resp 16 | Ht 63.0 in | Wt 223.0 lb

## 2015-05-10 DIAGNOSIS — I87303 Chronic venous hypertension (idiopathic) without complications of bilateral lower extremity: Secondary | ICD-10-CM | POA: Diagnosis not present

## 2015-05-10 DIAGNOSIS — I8311 Varicose veins of right lower extremity with inflammation: Secondary | ICD-10-CM | POA: Diagnosis not present

## 2015-05-10 DIAGNOSIS — M1611 Unilateral primary osteoarthritis, right hip: Secondary | ICD-10-CM

## 2015-05-10 DIAGNOSIS — I8312 Varicose veins of left lower extremity with inflammation: Secondary | ICD-10-CM | POA: Diagnosis not present

## 2015-05-10 DIAGNOSIS — R600 Localized edema: Secondary | ICD-10-CM | POA: Diagnosis not present

## 2015-05-10 DIAGNOSIS — I872 Venous insufficiency (chronic) (peripheral): Secondary | ICD-10-CM

## 2015-05-10 DIAGNOSIS — I10 Essential (primary) hypertension: Secondary | ICD-10-CM

## 2015-05-10 NOTE — Patient Instructions (Addendum)
Take the same current medications  Continue using your compression treatments to the legs.  Return at any time if acutely worse  Return sometime before midnight bring your medications for me to review and make sure everything is updated.  Continue to try and increase exercise. As always, try to eat less to help lose weight.  Return as needed    IF you received an x-ray today, you will receive an invoice from [facility]. Please contact [facility] at [phone] with questions or concerns regarding your invoice.   IF you received labwork today, you will receive an invoice from [employer]. Please contact [facility] at [phone] with questions or concerns regarding your invoice.   Our billing staff will not be able to assist you with questions regarding bills from these companies.  You will be contacted with the lab results as soon as they are available. The fastest way to get your results is to activate your My Chart account. Instructions are located on the last page of this paperwork. If you have not heard from [place] regarding the results in 2 weeks, please contact this office.

## 2015-05-10 NOTE — Progress Notes (Signed)
Patient ID: Chelsea Browning, female    DOB: 22-Apr-1932  Age: 82 y.o. MRN: [id number]  Chief Complaint  Patient presents with  . Follow-up    hip pain  . Hypertension    Subjective:   Patient is well-known to me, here for a follow-up. Her blood pressure is finally doing well and she is pleased with that. She has been pleased with the consultants that she is seeing, for cardiology and orthopedics. She is doing some yoga which she feels like is helping her. She continues to hurt in that hip and sometimes would like to get it repaired still. She does have a excoriation on her right lower extremity. The stasis dermatitis overall has continued to do very well, and she knows the need to keep taking care of it. Realizes that everything needs to stay stable for a stretch of time before she can have surgery on the hip. She is quite concerned that I'm retiring.  Current allergies, medications, problem list, past/family and social histories reviewed.  Objective:  BP 138/70 mmHg  Pulse 61  Temp(Src) 97.7 F (36.5 C) (Oral)  Resp 16  Ht 5\' 3"  (1.6 m)  Wt 223 lb (101.152 kg)  BMI 39.51 kg/m2  SpO2 96%  No major acute distress. Chest clear. Heart regular without murmur. Stasis dermatitis looks very satisfactory. Minimal edema. Wearing her stockings. One excoriation on the lower right shin.  Assessment & Plan:   Assessment: No diagnosis found.    Plan: She is doing quite well and is stable. I asked her to come in and bring all her medicine some time over the next month or so before I retire so that I can review everything with her. At that time we will rediscuss options for her.  No orders of the defined types were placed in this encounter.    No orders of the defined types were placed in this encounter.         Patient Instructions       IF you received an x-ray today, you will receive an invoice from [facility]. Please contact [facility] at [phone] with  questions or concerns regarding your invoice.   IF you received labwork today, you will receive an invoice from [employer]. Please contact [facility] at [phone] with questions or concerns regarding your invoice.   Our billing staff will not be able to assist you with questions regarding bills from these companies.  You will be contacted with the lab results as soon as they are available. The fastest way to get your results is to activate your My Chart account. Instructions are located on the last page of this paperwork. If you have not heard from [place] regarding the results in 2 weeks, please contact this office.         No Follow-up on file.   HOPPER,DAVID, MD 05/10/2015

## 2015-05-12 ENCOUNTER — Ambulatory Visit: Payer: Commercial Managed Care - HMO | Admitting: Family Medicine

## 2015-05-14 ENCOUNTER — Telehealth: Payer: Self-pay

## 2015-05-14 NOTE — Telephone Encounter (Signed)
Pt states she is having diarrhea and taking blood pressure medication can she take amodium with it   Best number [phone]

## 2015-05-15 ENCOUNTER — Other Ambulatory Visit: Payer: Self-pay | Admitting: Physician Assistant

## 2015-05-15 NOTE — Telephone Encounter (Signed)
That should be fine - please make sure she knows that she should only take a few immdoium because as soon as the stool starts to form she wants to stop to prevent constipation
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
## 2015-05-15 NOTE — Telephone Encounter (Signed)
I don't see why not? 

## 2015-05-15 NOTE — Telephone Encounter (Signed)
IC patient - her diarrhea is better.  She thinks it was due to spicy foods.  She is concerned about Dr. Linna Darner leaving and she wants to stay with this practice. She is asking for Reginia Forts as her PCP.   Will see Dr. Linna Darner on Tues for re ck.  Note: she states her dtr is the child care director where Dr. Tamala Julian takes or took her children!

## 2015-05-18 ENCOUNTER — Ambulatory Visit (INDEPENDENT_AMBULATORY_CARE_PROVIDER_SITE_OTHER): Payer: Commercial Managed Care - HMO | Admitting: Family Medicine

## 2015-05-18 ENCOUNTER — Telehealth: Payer: Self-pay

## 2015-05-18 VITALS — BP 132/54 | HR 71 | Temp 98.6°F | Resp 14 | Ht 63.0 in | Wt 223.0 lb

## 2015-05-18 DIAGNOSIS — N3090 Cystitis, unspecified without hematuria: Secondary | ICD-10-CM

## 2015-05-18 DIAGNOSIS — R300 Dysuria: Secondary | ICD-10-CM

## 2015-05-18 LAB — POC MICROSCOPIC URINALYSIS (UMFC): Mucus: ABSENT

## 2015-05-18 LAB — POCT URINALYSIS DIP (MANUAL ENTRY)
BILIRUBIN UA: NEGATIVE
Bilirubin, UA: NEGATIVE
Glucose, UA: NEGATIVE
Nitrite, UA: POSITIVE — AB
PH UA: 6
Spec Grav, UA: 1.02
Urobilinogen, UA: 0.2

## 2015-05-18 MED ORDER — PHENAZOPYRIDINE HCL 200 MG PO TABS: ORAL_TABLET | ORAL | Status: DC

## 2015-05-18 MED ORDER — SULFAMETHOXAZOLE-TRIMETHOPRIM 800-160 MG PO TABS: 1.0000 | ORAL_TABLET | Freq: Two times a day (BID) | ORAL | Status: DC

## 2015-05-18 NOTE — Progress Notes (Signed)
Patient ID: Chelsea Browning, female    DOB: 1933/02/03  Age: 82 y.o. MRN: [id number]  Chief Complaint  Patient presents with  . Groin Pain  . Back Pain  . Urinary Frequency  . Dysuria  . Other    Discuss Celexa, Pt states she only takes it some days     Subjective:   As noted. Patient is having urinary symptoms for the last 3 days. Has had UTIs in the past. Had a temperature up to 100.4.  Current allergies, medications, problem list, past/family and social histories reviewed.  Objective:  BP 132/54 mmHg  Pulse 71  Temp(Src) 98.6 F (37 C) (Oral)  Resp 14  Ht 5\' 3"  (1.6 m)  Wt 223 lb (101.152 kg)  BMI 39.51 kg/m2  SpO2 95%  No major acute distress. No CVA tenderness. Hurts down the sacral region. Abdomen soft with suprapubic tenderness. She apparently excoriated herself a little bit around her introitus, but we did not check that. Advised to keep using the Vaseline on it and not scratch too much.  Assessment & Plan:   Assessment: 1. Dysuria   2. Cystitis       Plan: Urinalysis is consistent with UTI. Will treat accordingly. Urine culture.  Orders Placed This Encounter  Procedures  . Urine culture    Order Specific Question:  Source    Answer:  cystitis  . POCT urinalysis dipstick  . POCT Microscopic Urinalysis ([facility])    Meds ordered this encounter  Medications  . sulfamethoxazole-trimethoprim (BACTRIM DS,SEPTRA DS) 800-160 MG tablet    Sig: Take 1 tablet by mouth 2 (two) times daily.    Dispense:  10 tablet    Refill:  0  . phenazopyridine (PYRIDIUM) 200 MG tablet    Sig: Take 1 pill 3 times daily until urinary pain is improved. Will make urine dark orange    Dispense:  6 tablet    Refill:  0         Patient Instructions   Drink plenty of fluids  Take the sulfamethoxazole one twice daily for 5 days  Return if not improving  We will do a culture on the urine to make sure that it is an infection that will respond to the sulfa.  Urinary Tract  Infection Urinary tract infections (UTIs) can develop anywhere along your urinary tract. Your urinary tract is your body's drainage system for removing wastes and extra water. Your urinary tract includes two kidneys, two ureters, a bladder, and a urethra. Your kidneys are a pair of bean-shaped organs. Each kidney is about the size of your fist. They are located below your ribs, one on each side of your spine. CAUSES Infections are caused by microbes, which are microscopic organisms, including fungi, viruses, and bacteria. These organisms are so small that they can only be seen through a microscope. Bacteria are the microbes that most commonly cause UTIs. SYMPTOMS  Symptoms of UTIs may vary by age and gender of the patient and by the location of the infection. Symptoms in young women typically include a frequent and intense urge to urinate and a painful, burning feeling in the bladder or urethra during urination. Older women and men are more likely to be tired, shaky, and weak and have muscle aches and abdominal pain. A fever may mean the infection is in your kidneys. Other symptoms of a kidney infection include pain in your back or sides below the ribs, nausea, and vomiting. DIAGNOSIS To diagnose a UTI, your caregiver will ask  you about your symptoms. Your caregiver will also ask you to provide a urine sample. The urine sample will be tested for bacteria and white blood cells. White blood cells are made by your body to help fight infection. TREATMENT  Typically, UTIs can be treated with medication. Because most UTIs are caused by a bacterial infection, they usually can be treated with the use of antibiotics. The choice of antibiotic and length of treatment depend on your symptoms and the type of bacteria causing your infection. HOME CARE INSTRUCTIONS  If you were prescribed antibiotics, take them exactly as your caregiver instructs you. Finish the medication even if you feel better after you have only  taken some of the medication.  Drink enough water and fluids to keep your urine clear or pale yellow.  Avoid caffeine, tea, and carbonated beverages. They tend to irritate your bladder.  Empty your bladder often. Avoid holding urine for long periods of time.  Empty your bladder before and after sexual intercourse.  After a bowel movement, women should cleanse from front to back. Use each tissue only once. SEEK MEDICAL CARE IF:   You have back pain.  You develop a fever.  Your symptoms do not begin to resolve within 3 days. SEEK IMMEDIATE MEDICAL CARE IF:   You have severe back pain or lower abdominal pain.  You develop chills.  You have nausea or vomiting.  You have continued burning or discomfort with urination. MAKE SURE YOU:   Understand these instructions.  Will watch your condition.  Will get help right away if you are not doing well or get worse.   This information is not intended to replace advice given to you by your health care provider. Make sure you discuss any questions you have with your health care provider.   Document Released: 11/03/2004 Document Revised: 10/15/2014 Document Reviewed: 03/04/2011 Elsevier Interactive Patient Education 2016 [employer].     IF you received an x-ray today, you will receive an invoice from [facility]. Please contact [facility] at [phone] with questions or concerns regarding your invoice.   IF you received labwork today, you will receive an invoice from [employer]. Please contact [facility] at [phone] with questions or concerns regarding your invoice.   Our billing staff will not be able to assist you with questions regarding bills from these companies.  You will be contacted with the lab results as soon as they are available. The fastest way to get your results is to activate your My Chart account. Instructions are located on the last page of this paperwork. If you  have not heard from [place] regarding the results in 2 weeks, please contact this office.         No Follow-up on file.   Birch Farino, MD 05/18/2015

## 2015-05-18 NOTE — Telephone Encounter (Signed)
Informed Pt that she could use Dr. Tamala Julian as PCP. She was very happy! She will call back to make an appt to est care.

## 2015-05-18 NOTE — Telephone Encounter (Signed)
Noted. I am happy to see patient when Dr. Levell July.  Please advise patient.

## 2015-05-18 NOTE — Telephone Encounter (Signed)
Patient is calling to speak to [facility]. She wants to thank [facility] for suggesting Dr. Tamala Julian as a PCP. Please call! [phone]

## 2015-05-18 NOTE — Patient Instructions (Addendum)
Drink plenty of fluids  Take the sulfamethoxazole one twice daily for 5 days  Return if not improving  We will do a culture on the urine to make sure that it is an infection that will respond to the sulfa.  Urinary Tract Infection Urinary tract infections (UTIs) can develop anywhere along your urinary tract. Your urinary tract is your body's drainage system for removing wastes and extra water. Your urinary tract includes two kidneys, two ureters, a bladder, and a urethra. Your kidneys are a pair of bean-shaped organs. Each kidney is about the size of your fist. They are located below your ribs, one on each side of your spine. CAUSES Infections are caused by microbes, which are microscopic organisms, including fungi, viruses, and bacteria. These organisms are so small that they can only be seen through a microscope. Bacteria are the microbes that most commonly cause UTIs. SYMPTOMS  Symptoms of UTIs may vary by age and gender of the patient and by the location of the infection. Symptoms in young women typically include a frequent and intense urge to urinate and a painful, burning feeling in the bladder or urethra during urination. Older women and men are more likely to be tired, shaky, and weak and have muscle aches and abdominal pain. A fever may mean the infection is in your kidneys. Other symptoms of a kidney infection include pain in your back or sides below the ribs, nausea, and vomiting. DIAGNOSIS To diagnose a UTI, your caregiver will ask you about your symptoms. Your caregiver will also ask you to provide a urine sample. The urine sample will be tested for bacteria and white blood cells. White blood cells are made by your body to help fight infection. TREATMENT  Typically, UTIs can be treated with medication. Because most UTIs are caused by a bacterial infection, they usually can be treated with the use of antibiotics. The choice of antibiotic and length of treatment depend on your symptoms  and the type of bacteria causing your infection. HOME CARE INSTRUCTIONS  If you were prescribed antibiotics, take them exactly as your caregiver instructs you. Finish the medication even if you feel better after you have only taken some of the medication.  Drink enough water and fluids to keep your urine clear or pale yellow.  Avoid caffeine, tea, and carbonated beverages. They tend to irritate your bladder.  Empty your bladder often. Avoid holding urine for long periods of time.  Empty your bladder before and after sexual intercourse.  After a bowel movement, women should cleanse from front to back. Use each tissue only once. SEEK MEDICAL CARE IF:   You have back pain.  You develop a fever.  Your symptoms do not begin to resolve within 3 days. SEEK IMMEDIATE MEDICAL CARE IF:   You have severe back pain or lower abdominal pain.  You develop chills.  You have nausea or vomiting.  You have continued burning or discomfort with urination. MAKE SURE YOU:   Understand these instructions.  Will watch your condition.  Will get help right away if you are not doing well or get worse.   This information is not intended to replace advice given to you by your health care provider. Make sure you discuss any questions you have with your health care provider.   Document Released: 11/03/2004 Document Revised: 10/15/2014 Document Reviewed: 03/04/2011 Elsevier Interactive Patient Education 2016 [employer].     IF you received an x-ray today, you will receive an invoice from [facility].  Please contact [facility] at [phone] with questions or concerns regarding your invoice.   IF you received labwork today, you will receive an invoice from [employer]. Please contact [facility] at [phone] with questions or concerns regarding your invoice.   Our billing staff will not be able to assist you with questions regarding bills from these  companies.  You will be contacted with the lab results as soon as they are available. The fastest way to get your results is to activate your My Chart account. Instructions are located on the last page of this paperwork. If you have not heard from [place] regarding the results in 2 weeks, please contact this office.

## 2015-05-20 LAB — URINE CULTURE

## 2015-05-21 ENCOUNTER — Other Ambulatory Visit: Payer: Self-pay | Admitting: Family Medicine

## 2015-05-21 ENCOUNTER — Other Ambulatory Visit: Payer: Self-pay | Admitting: *Deleted

## 2015-05-21 ENCOUNTER — Telehealth: Payer: Self-pay

## 2015-05-21 DIAGNOSIS — D649 Anemia, unspecified: Secondary | ICD-10-CM

## 2015-05-21 DIAGNOSIS — N3090 Cystitis, unspecified without hematuria: Secondary | ICD-10-CM

## 2015-05-21 MED ORDER — CIPROFLOXACIN HCL 500 MG PO TABS: 500.0000 mg | ORAL_TABLET | Freq: Two times a day (BID) | ORAL | Status: DC

## 2015-05-21 NOTE — Telephone Encounter (Signed)
Patient already stopped the sulfa.  Rash only is on  The legs.  Come in if still getting worse.

## 2015-05-21 NOTE — Telephone Encounter (Signed)
PATIENT WANTS DR. HOPPER TO CALL HER BACK AS SOON AS POSSIBLE. SHE HAS A RASH ON BOTH LEGS. BEST PHONE [phone] (CELL)  [place]

## 2015-05-22 ENCOUNTER — Other Ambulatory Visit: Payer: Self-pay | Admitting: *Deleted

## 2015-05-22 DIAGNOSIS — D6489 Other specified anemias: Secondary | ICD-10-CM

## 2015-05-25 ENCOUNTER — Encounter: Payer: Self-pay | Admitting: Hematology

## 2015-05-25 ENCOUNTER — Ambulatory Visit (HOSPITAL_BASED_OUTPATIENT_CLINIC_OR_DEPARTMENT_OTHER): Payer: Commercial Managed Care - HMO | Admitting: Hematology

## 2015-05-25 ENCOUNTER — Telehealth: Payer: Self-pay | Admitting: Hematology

## 2015-05-25 ENCOUNTER — Other Ambulatory Visit (HOSPITAL_BASED_OUTPATIENT_CLINIC_OR_DEPARTMENT_OTHER): Payer: Commercial Managed Care - HMO

## 2015-05-25 VITALS — BP 145/36 | HR 59 | Temp 97.8°F | Resp 18 | Ht 63.0 in | Wt 221.5 lb

## 2015-05-25 DIAGNOSIS — D472 Monoclonal gammopathy: Secondary | ICD-10-CM

## 2015-05-25 DIAGNOSIS — D638 Anemia in other chronic diseases classified elsewhere: Secondary | ICD-10-CM

## 2015-05-25 DIAGNOSIS — D801 Nonfamilial hypogammaglobulinemia: Secondary | ICD-10-CM

## 2015-05-25 DIAGNOSIS — D649 Anemia, unspecified: Secondary | ICD-10-CM | POA: Diagnosis not present

## 2015-05-25 DIAGNOSIS — D6489 Other specified anemias: Secondary | ICD-10-CM

## 2015-05-25 LAB — CBC & DIFF AND RETIC
BASO%: 0.4 % (ref 0.0–2.0)
BASOS ABS: 0 10*3/uL (ref 0.0–0.1)
EOS ABS: 0.2 10*3/uL (ref 0.0–0.5)
EOS%: 4.7 % (ref 0.0–7.0)
HCT: 29.6 % — ABNORMAL LOW (ref 34.8–46.6)
HEMOGLOBIN: 9.9 g/dL — AB (ref 11.6–15.9)
IMMATURE RETIC FRACT: 4.6 % (ref 1.60–10.00)
LYMPH%: 16.6 % (ref 14.0–49.7)
MCH: 31 pg (ref 25.1–34.0)
MCHC: 33.4 g/dL (ref 31.5–36.0)
MCV: 92.8 fL (ref 79.5–101.0)
MONO#: 0.4 10*3/uL (ref 0.1–0.9)
MONO%: 8.8 % (ref 0.0–14.0)
NEUT#: 3.4 10*3/uL (ref 1.5–6.5)
NEUT%: 69.5 % (ref 38.4–76.8)
PLATELETS: 203 10*3/uL (ref 145–400)
RBC: 3.19 10*6/uL — ABNORMAL LOW (ref 3.70–5.45)
RDW: 13.8 % (ref 11.2–14.5)
Retic %: 1.32 % (ref 0.70–2.10)
Retic Ct Abs: 42.11 10*3/uL (ref 33.70–90.70)
WBC: 4.9 10*3/uL (ref 3.9–10.3)
lymph#: 0.8 10*3/uL — ABNORMAL LOW (ref 0.9–3.3)

## 2015-05-25 LAB — COMPREHENSIVE METABOLIC PANEL
ALBUMIN: 3.9 g/dL (ref 3.5–5.0)
ALK PHOS: 109 U/L (ref 40–150)
ALT: 12 U/L (ref 0–55)
AST: 17 U/L (ref 5–34)
Anion Gap: 8 mEq/L (ref 3–11)
BUN: 31.6 mg/dL — AB (ref 7.0–26.0)
CO2: 23 mEq/L (ref 22–29)
Calcium: 9.7 mg/dL (ref 8.4–10.4)
Chloride: 103 mEq/L (ref 98–109)
Creatinine: 1.3 mg/dL — ABNORMAL HIGH (ref 0.6–1.1)
EGFR: 38 mL/min/{1.73_m2} — AB (ref >90)
GLUCOSE: 99 mg/dL (ref 70–140)
Potassium: 5.1 mEq/L (ref 3.5–5.1)
SODIUM: 134 meq/L — AB (ref 136–145)
TOTAL PROTEIN: 6.9 g/dL (ref 6.4–8.3)
Total Bilirubin: 0.38 mg/dL (ref 0.20–1.20)

## 2015-05-25 LAB — IRON AND TIBC
%SAT: 32 % (ref 21–57)
Iron: 85 ug/dL (ref 41–142)
TIBC: 264 ug/dL (ref 236–444)
UIBC: 178 ug/dL (ref 120–384)

## 2015-05-25 LAB — FERRITIN: Ferritin: 276 ng/ml — ABNORMAL HIGH (ref 9–269)

## 2015-05-25 NOTE — Telephone Encounter (Signed)
per pof to sch pt appt-gave pt copy of avs °
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
## 2015-05-30 ENCOUNTER — Ambulatory Visit (INDEPENDENT_AMBULATORY_CARE_PROVIDER_SITE_OTHER): Payer: Commercial Managed Care - HMO | Admitting: Family Medicine

## 2015-05-30 VITALS — BP 136/82 | HR 78 | Temp 98.0°F | Resp 14 | Ht 63.0 in | Wt 220.0 lb

## 2015-05-30 DIAGNOSIS — R300 Dysuria: Secondary | ICD-10-CM | POA: Diagnosis not present

## 2015-05-30 LAB — POC MICROSCOPIC URINALYSIS (UMFC)

## 2015-05-30 LAB — POCT URINALYSIS DIP (MANUAL ENTRY)
Bilirubin, UA: NEGATIVE
Blood, UA: NEGATIVE
GLUCOSE UA: NEGATIVE
Ketones, POC UA: NEGATIVE
LEUKOCYTES UA: NEGATIVE
Nitrite, UA: NEGATIVE
Protein Ur, POC: 30 — AB
Spec Grav, UA: 1.015
UROBILINOGEN UA: 0.2
pH, UA: 5.5

## 2015-05-30 NOTE — Progress Notes (Signed)
Patient ID: Chelsea Browning, female    DOB: 06-03-1932  Age: 82 y.o. MRN: [id number]  Chief Complaint  Patient presents with  . Dyspareunia    Subjective:   Patient is here for a follow-up of her UTI. She is doing better though still has minimal symptoms.  Current allergies, medications, problem list, past/family and social histories reviewed.  Objective:  BP 136/82 mmHg  Pulse 78  Temp(Src) 98 F (36.7 C) (Oral)  Resp 14  Ht 5\' 3"  (1.6 m)  Wt 220 lb (99.791 kg)  BMI 38.98 kg/m2  SpO2 95%  Continues with a little pyuria. Chest clear. Heart regular. Results for orders placed or performed in visit on 05/30/15  POCT urinalysis dipstick  Result Value Ref Range   Color, UA yellow yellow   Clarity, UA hazy (A) clear   Glucose, UA negative negative   Bilirubin, UA negative negative   Ketones, POC UA negative negative   Spec Grav, UA 1.015    Blood, UA negative negative   pH, UA 5.5    Protein Ur, POC =30 (A) negative   Urobilinogen, UA 0.2    Nitrite, UA Negative Negative   Leukocytes, UA Negative Negative  POCT Microscopic Urinalysis ([facility])  Result Value Ref Range   WBC,UR,HPF,POC Moderate (A) None WBC/hpf   RBC,UR,HPF,POC None None RBC/hpf   Bacteria Few (A) None, Too numerous to count   Mucus Present (A) Absent   Epithelial Cells, UR Per Microscopy Few (A) None, Too numerous to count cells/hpf     Assessment & Plan:   Assessment: 1. Dysuria       Plan: See instructions  Orders Placed This Encounter  Procedures  . POCT urinalysis dipstick  . POCT Microscopic Urinalysis ([facility])  . POCT urinalysis dipstick    Standing Status: Future     Number of Occurrences:      Standing Expiration Date: 06/16/2015  . POCT Microscopic Urinalysis ([facility])    Standing Status: Future     Number of Occurrences:      Standing Expiration Date: 06/16/2015   . Results for orders placed or performed in visit on 05/30/15  POCT urinalysis dipstick  Result Value Ref Range   Color,  UA yellow yellow   Clarity, UA hazy (A) clear   Glucose, UA negative negative   Bilirubin, UA negative negative   Ketones, POC UA negative negative   Spec Grav, UA 1.015    Blood, UA negative negative   pH, UA 5.5    Protein Ur, POC =30 (A) negative   Urobilinogen, UA 0.2    Nitrite, UA Negative Negative   Leukocytes, UA Negative Negative  POCT Microscopic Urinalysis ([facility])  Result Value Ref Range   WBC,UR,HPF,POC Moderate (A) None WBC/hpf   RBC,UR,HPF,POC None None RBC/hpf   Bacteria Few (A) None, Too numerous to count   Mucus Present (A) Absent   Epithelial Cells, UR Per Microscopy Few (A) None, Too numerous to count cells/hpf   s  No orders of the defined types were placed in this encounter.         Patient Instructions   Complete the course of Cipro.  Wait about a week or 2 and come back for a clean catch urine and culture.    IF you received an x-ray today, you will receive an invoice from [facility]. Please contact [facility] at [phone] with questions or concerns regarding your invoice.   IF you received labwork today, you will receive an invoice from [facility]. Please contact [facility] at [phone] with questions or concerns regarding your invoice.   Our billing staff will not be able to assist you with questions regarding bills from these companies.  You will be contacted with the lab results as soon as they are available. The fastest way to get your results is to activate your My Chart account. Instructions are located on the last page of this paperwork. If you have not heard from [place] regarding the results in 2 weeks, please contact this office.          Return if symptoms worsen or fail to improve.   HOPPER,DAVID, MD 05/30/2015

## 2015-05-30 NOTE — Patient Instructions (Addendum)
Complete the course of Cipro.  Wait about a week or 2 and come back for a clean catch urine and culture.    IF you received an x-ray today, you will receive an invoice from [facility]. Please contact [facility] at [phone] with questions or concerns regarding your invoice.   IF you received labwork today, you will receive an invoice from [employer]. Please contact [facility] at [phone] with questions or concerns regarding your invoice.   Our billing staff will not be able to assist you with questions regarding bills from these companies.  You will be contacted with the lab results as soon as they are available. The fastest way to get your results is to activate your My Chart account. Instructions are located on the last page of this paperwork. If you have not heard from [place] regarding the results in 2 weeks, please contact this office.

## 2015-06-01 ENCOUNTER — Encounter: Payer: Self-pay | Admitting: *Deleted

## 2015-06-01 ENCOUNTER — Other Ambulatory Visit: Payer: Self-pay | Admitting: *Deleted

## 2015-06-01 ENCOUNTER — Ambulatory Visit: Payer: Self-pay | Admitting: *Deleted

## 2015-06-01 NOTE — Patient Outreach (Addendum)
[facility]) Care Management  06/01/2015  Chelsea Browning November 14, 1932 [id number]   Assessment: Care coordination call Received message this weekend from patient requesting care management coordinator to give her a call back to cancel and reschedule the home visit that was scheduled for today 4/24. Call placed to patient early this morning and was able to speak to her while waiting for the bus transportation. She notified care management coordinator that she has an appointment with primary care provider today and Friday 4/28 to follow-up  her urinary tract infection.  She reports that 2 weeks ago, she was started on Sulfa antibiotic for signs and symptoms of urinary infection. Urine analysis and culture showed resistance to Sulfa, therefore she was switched to Cipro 500 mg twice a day for a week. Reminded patient to complete antibiotic dosage even if she starts feeling better and encouraged her to take plenty of fluids as well and she verbalized understanding. She states she has 2 more days to complete Cipro.  Patient requested to reschedule routine home visit one day this week and ask care management to call her back for the time of schedule since her bus transportation has arrived.  Plan: Will give patient a call later today to set-up a reschedule for a routine home visit.   ADDENDUM: Call placed and spoke to patient stating she just arrived from provider's follow-up visit. She stated it was okay for care management coordinator to come to her house and do routine home visit at this time. Care management coordinator agreed.   Plan: Will do routine home visit today (this afternoon).   Nguyen Butler A. Laurna Shetley, BSN, RN-BC [place] Management Coordinator Cell: [phone]

## 2015-06-01 NOTE — Patient Outreach (Signed)
[facility]) Care Management   06/01/2015  Chelsea Browning 1932/06/26 [id number]  Chelsea Browning is an 82 y.o. female  Subjective: Patient reports "feeling better". She states "blood pressure results are much better".  Patient verbalized improved pain with urination, bladder discomfort and groin pain.  Objective: BP 132/62 mmHg  Pulse 64  Resp 18  SpO2 96%  Review of Systems  Constitutional: Positive for weight loss.       Desirable weight loss per patient as recommended by cardiologist and primary care provider  HENT:       Minimal headaches  Eyes: Negative.        Wears eyeglasses for reading  Respiratory: Negative.  Negative for cough, sputum production, shortness of breath and wheezing.        Respirations even and unlabored Lungs clear to auscultation  Cardiovascular: Positive for palpitations and leg swelling. Negative for chest pain.       Regular rate at rhythm Very minimal palpitations reported by patient Edema (hard) to bilateral lower extremities- pinkish shins (per usual) Bilateral lower extremity non-pitting edema (hard)- use of compression boots  Gastrointestinal: Negative.  Negative for nausea, vomiting, abdominal pain, diarrhea and constipation.       Bowel sounds present Abdomen obese, soft and non-tender  Genitourinary: Positive for frequency.       Wears pad at times at nights for urine leakage (stress incontinence)  Musculoskeletal: Positive for joint pain and falls.       Chronic right hip pain History of falls  Skin: Negative.        Bruise to right arm  Neurological: Negative.   Endo/Heme/Allergies: Negative.   Psychiatric/Behavioral: Positive for memory loss.       Forgetful at times    Physical Exam  Encounter Medications:   Outpatient Encounter Prescriptions as of 06/01/2015  Medication Sig Note  . acetaminophen (TYLENOL) 500 MG tablet Take 1,000 mg by mouth every 6 (six) hours as needed for moderate pain. 06/01/2015: Every 8  hours as needed for pain- taken at night  . amLODipine (NORVASC) 5 MG tablet Take 5 mg by mouth daily.   Marland Kitchen atorvastatin (LIPITOR) 40 MG tablet Take 1 tablet (40 mg total) by mouth daily.   . ciprofloxacin (CIPRO) 500 MG tablet Take 1 tablet (500 mg total) by mouth 2 (two) times daily.   Marland Kitchen dipyridamole (PERSANTINE) 75 MG tablet Take 75 mg by mouth 2 (two) times daily.   Marland Kitchen HYDROcodone-acetaminophen (NORCO/VICODIN) 5-325 MG tablet Take 1 tablet by mouth 2 (two) times daily as needed for moderate pain or severe pain. Reported on 04/27/2015   . labetalol (NORMODYNE) 200 MG tablet Take 1 tablet (200 mg total) by mouth 3 (three) times daily.   Marland Kitchen losartan (COZAAR) 50 MG tablet TAKE 1 TABLET DAILY.   Marland Kitchen omeprazole (PRILOSEC) 20 MG capsule Take 1 capsule by mouth before breakfast daily.   . phenazopyridine (PYRIDIUM) 200 MG tablet Take 1 pill 3 times daily until urinary pain is improved. Will make urine dark orange   . spironolactone (ALDACTONE) 25 MG tablet Take 25 mg by mouth daily.   . Triamcinolone Acetonide (TRIAMCINOLONE 0.1 % CREAM : EUCERIN) CREA Use once or twice daily on the legs   . triamcinolone cream (KENALOG) 0.1 % APPLY TO AFFECTED AREAS ON LEGS ONCE OR TWICE DAILY.   . hydrOXYzine (ATARAX/VISTARIL) 10 MG tablet Take one every 6 or 8 hours if needed for itching (Patient not taking: Reported on 06/01/2015)    No  facility-administered encounter medications on file as of 06/01/2015.    Functional Status:   In your present state of health, do you have any difficulty performing the following activities: 06/01/2015 04/27/2015  Hearing? N N  Vision? N N  Difficulty concentrating or making decisions? Y N  Walking or climbing stairs? Y Y  Dressing or bathing? N N  Doing errands, shopping? [name]  Preparing Food and eating ? N N  Using the Toilet? N N  In the past six months, have you accidently leaked urine? Y Y  Do you have problems with loss of bowel control? N N  Managing your Medications? N N   Managing your Finances? [name]  Housekeeping or managing your Housekeeping? [name]    Fall/Depression Screening:    PHQ 2/9 Scores 06/01/2015 05/30/2015 05/18/2015 05/10/2015 04/27/2015 04/02/2015 03/31/2015  PHQ - 2 Score 0 0 0 0 0 0 0    Assessment:   82 year old female seen for hypertension (blood pressure management). Arrived at patient's first level apartment. Patient and husband present during the visit. Patient states improved urinary discomfort and has 2 more doses of Cipro left to complete. Reminded patient to drink plenty of fluids and to report to provider if condition worsens. Patient agreed. Patient has another follow-up visit with primary care provider on Friday 4/28.  She rides the [employer] bus for providers' appointments. Reinforced with patient her [employer] benefits for transportation and states she is aware.  Patient shares that she had lost about 17 pounds over the winter and working to lose more as recommended by cardiologist and primary care provider. Patient continues with her exercise program at the [facility] on Mondays- Wednesdays- Fridays. She is aware to eat less to help lose weight.   She mentioned that primary physician was delighted with her blood pressure coming down as well. She states continuing to take medications as ordered. Patient's cardiology had informed patient that echocardiogram showed no blockage and has slightly enlarged heart possibly from her weight as stated. Per hematologist, her blood counts were good as reported by patient. Patient's blood pressure readings ranges below 150 (systolic) at 120/50's- 140/80's. Her weight of 226 pounds last month is now at 220 pounds.  Patient still in the process of obtaining weighing scale. She and her husband reports eating more vegetables and fruits purchased mostly from the [employer]. She also states avoiding fried and fatty foods and cutting down salt intake. Patient was reminded to read food labels before purchasing and  she agreed. Reinforced with patient about daily blood pressure checks, recording results on [facility] notebook and bringing results to provider's visit for review.  She has been debating whether to have right hip surgery and is weighing the risk versus benefits of it. Patient's daughter had arranged for a caregiver who comes Monday, Wednesday and Friday while daughter and family are on vacation to [place].  Patient is unable to identify any additional needs or concerns at this time. She agreed to case closure with goals met and notifying primary care provider of such. Encouraged patient to call [facility] management coordinator or 24 hour nurse line if needed or if any future needs arise. Contact information had been provided to patient.    Plan: Will close case with goals met.  Will notify primary care provider of case closure.   [facility]N CM Care Plan Problem One        Most Recent Value   Care Plan Problem One  knowledge deficit regarding  hypertension management   Role Documenting the Problem One  Care Management Coordinator   Care Plan for Problem One  Not Active   [facility]N Long Term Goal (31-90 days)  patient will report managing blood pressure as evidenced by blood pressure readings 150 and below in the next 60 days   THN Long Term Goal Start Date  04/02/15   [facility] Long Term Goal Met Date  06/01/15 [Goal met. BP readings range from 120-140's over 50-80's]   [facility] CM Short Term Goal #1 (0-30 days)  patient will monitor blood pressure daily and record in the next 30 days   [facility] CM Short Term Goal #1 Start Date  04/02/15 [Will continue to work on GOAL (per patient)]   [facility] CM Short Term Goal #1 Met Date  06/01/15 [Met- reinforce cont'd.monitoring & recording of BP]   [facility] CM Short Term Goal #2 (0-30 days)  patient will report medication adherence to (new) blood pressure medications ordered in the next 30 days   [facility] CM Short Term Goal #2 Start Date  04/02/15   [facility] CM Short Term Goal #2 Met Date  04/27/15 [Goal  met-taking Losartan and Amlodipine,  [id number]]   [facility] CM Short Term Goal #3 (0-30 days)  patient will report attending exercise classes at least twice a week in the next 30 days   [facility]N CM Short Term Goal #3 Start Date  04/02/15 [exercises M-W-F at [facility]   [facility] CM Short Term Goal #3 Met Date  04/27/15 [Goal met- completed Outpt.therapy,  cont.exercises at [facility]]   [facility] CM Short Term Goal #4 (0-30 days)  patient will weigh self once a week in the next 30 days    [facility]N CM Short Term Goal #4 Start Date  04/27/15   [facility] CM Short Term Goal #4 Met Date  06/01/15 [Met-weight checked @ provider's office, will buy own scale ]     Karin Golden A. Kaaliyah Kita, BSN, RN-BC [facility] Care Management Coordinator Cell: [phone]

## 2015-06-05 ENCOUNTER — Ambulatory Visit (INDEPENDENT_AMBULATORY_CARE_PROVIDER_SITE_OTHER): Payer: Commercial Managed Care - HMO | Admitting: Family Medicine

## 2015-06-05 VITALS — BP 130/62 | HR 75 | Temp 98.3°F | Resp 20 | Ht 63.0 in | Wt 224.4 lb

## 2015-06-05 DIAGNOSIS — I8312 Varicose veins of left lower extremity with inflammation: Secondary | ICD-10-CM

## 2015-06-05 DIAGNOSIS — E871 Hypo-osmolality and hyponatremia: Secondary | ICD-10-CM

## 2015-06-05 DIAGNOSIS — I8311 Varicose veins of right lower extremity with inflammation: Secondary | ICD-10-CM

## 2015-06-05 DIAGNOSIS — I872 Venous insufficiency (chronic) (peripheral): Secondary | ICD-10-CM

## 2015-06-05 DIAGNOSIS — L299 Pruritus, unspecified: Secondary | ICD-10-CM | POA: Diagnosis not present

## 2015-06-05 DIAGNOSIS — R300 Dysuria: Secondary | ICD-10-CM | POA: Diagnosis not present

## 2015-06-05 DIAGNOSIS — I10 Essential (primary) hypertension: Secondary | ICD-10-CM | POA: Diagnosis not present

## 2015-06-05 DIAGNOSIS — R579 Shock, unspecified: Secondary | ICD-10-CM

## 2015-06-05 LAB — BASIC METABOLIC PANEL
BUN: 40 mg/dL — AB (ref 7–25)
CO2: 23 mmol/L (ref 20–31)
CREATININE: 0.96 mg/dL — AB (ref 0.60–0.88)
Calcium: 9.5 mg/dL (ref 8.6–10.4)
Chloride: 106 mmol/L (ref 98–110)
Glucose, Bld: 117 mg/dL — ABNORMAL HIGH (ref 65–99)
POTASSIUM: 4.8 mmol/L (ref 3.5–5.3)
Sodium: 138 mmol/L (ref 135–146)

## 2015-06-05 MED ORDER — TRIAMCINOLONE 0.1 % CREAM:EUCERIN CREAM 1:1: TOPICAL_CREAM | CUTANEOUS | Status: DC

## 2015-06-05 MED ORDER — CEPHALEXIN 500 MG PO CAPS: 500.0000 mg | ORAL_CAPSULE | Freq: Three times a day (TID) | ORAL | Status: DC

## 2015-06-05 MED ORDER — DIPYRIDAMOLE 75 MG PO TABS: 75.0000 mg | ORAL_TABLET | Freq: Two times a day (BID) | ORAL | Status: DC

## 2015-06-05 MED ORDER — LOSARTAN POTASSIUM 50 MG PO TABS: 50.0000 mg | ORAL_TABLET | Freq: Every day | ORAL | Status: DC

## 2015-06-05 NOTE — Patient Instructions (Addendum)
Bring a urine specimen in Monday morning  Continue your medications as discussed. Refills been sent in to the pharmacy.  Take cephalexin 500 mg  3 times daily for 7 days for the leg  Return if needed, otherwise return in 3 months.    IF you received an x-ray today, you will receive an invoice from [facility]. Please contact [facility] at [phone] with questions or concerns regarding your invoice.   IF you received labwork today, you will receive an invoice from [employer]. Please contact [facility] at [phone] with questions or concerns regarding your invoice.   Our billing staff will not be able to assist you with questions regarding bills from these companies.  You will be contacted with the lab results as soon as they are available. The fastest way to get your results is to activate your My Chart account. Instructions are located on the last page of this paperwork. If you have not heard from [place] regarding the results in 2 weeks, please contact this office.

## 2015-06-05 NOTE — Progress Notes (Signed)
Patient ID: Chelsea Browning, female    DOB: March 29, 1932  Age: 83 y.o. MRN: [id number]  Chief Complaint  Patient presents with  . Urinary Tract Infection    pt stated that the cipro helped some but she is still having the dysuria    Subjective:   Patient is here to go over her medicines one more time before I retire. We went through her whole medicine less and tried to clean it up. Her only medicine not on the list that she still hasn't hasn't taken was a prescription for citalopram I gave her about 5 months ago. She is doing well and probably doesn't need that at this time. She continues to have your eyes over the decision as to when to get her right total hip taking care of. She is paranoid about getting it done. She stays active. She has chronic stasis dermatitis of her legs which comes and goes. She has an area on her left leg that she has excoriated a little bit and has a little cellulitis from it right now. She also is here to get her urine rechecked for recent UTI, still has a little dysuria. Reviewing old labs I see that her last chemistries had a low sodium so we will repeat that.  Current allergies, medications, problem list, past/family and social histories reviewed.  Objective:  BP 130/62 mmHg  Pulse 75  Temp(Src) 98.3 F (36.8 C) (Oral)  Resp 20  Ht 5\' 3"  (1.6 m)  Wt 224 lb 6 oz (101.776 kg)  BMI 39.76 kg/m2  SpO2 95%  No major distress. 2+ edema. Excoriations on left shin. There is surrounding erythema consistent with a mild cellulitis.  Assessment & Plan:   Assessment: 1. Venous stasis dermatitis of both lower extremities   2. Itching   3. Failure of peripheral circulation ([place])   4. Essential hypertension   5. Hyponatremia   6. Dysuria       Plan: Was unable to void today. She will bring a specimen back in for [place].  Orders Placed This Encounter  Procedures  . Basic metabolic panel  . POCT urinalysis dipstick  . POCT Microscopic Urinalysis ([facility])    Meds  ordered this encounter  Medications  . losartan (COZAAR) 50 MG tablet    Sig: Take 1 tablet (50 mg total) by mouth daily.    Dispense:  90 tablet    Refill:  3  . Triamcinolone Acetonide (TRIAMCINOLONE 0.1 % CREAM : EUCERIN) CREA    Sig: Use once or twice daily on the legs    Dispense:  1 each    Refill:  3  . dipyridamole (PERSANTINE) 75 MG tablet    Sig: Take 1 tablet (75 mg total) by mouth 2 (two) times daily.    Dispense:  60 tablet    Refill:  12         Patient Instructions       IF you received an x-ray today, you will receive an invoice from [facility]. Please contact [facility] at [phone] with questions or concerns regarding your invoice.   IF you received labwork today, you will receive an invoice from [employer]. Please contact [facility] at [phone] with questions or concerns regarding your invoice.   Our billing staff will not be able to assist you with questions regarding bills from these companies.  You will be contacted with the lab results as soon as they are available. The fastest way to get your results is  to activate your My Chart account. Instructions are located on the last page of this paperwork. If you have not heard from [place] regarding the results in 2 weeks, please contact this office.         No Follow-up on file.   HOPPER,DAVID, MD 06/05/2015

## 2015-06-08 LAB — POCT URINALYSIS DIP (MANUAL ENTRY)
BILIRUBIN UA: NEGATIVE
GLUCOSE UA: NEGATIVE
Ketones, POC UA: NEGATIVE
LEUKOCYTES UA: NEGATIVE
NITRITE UA: NEGATIVE
PH UA: 5
Protein Ur, POC: NEGATIVE
Spec Grav, UA: <=1.005
UROBILINOGEN UA: 0.2

## 2015-06-08 LAB — POC MICROSCOPIC URINALYSIS (UMFC): Mucus: ABSENT

## 2015-06-08 NOTE — Addendum Note (Signed)
Addended by: Frutoso Chase A on: 06/08/2015 11:03 AM   Modules accepted: Orders

## 2015-06-09 ENCOUNTER — Telehealth: Payer: Self-pay

## 2015-06-09 NOTE — Telephone Encounter (Signed)
Pt called about her labs from Friday, 06-05-15 and would like to know her results.  Call her back at  [phone]

## 2015-06-11 NOTE — Telephone Encounter (Signed)
Lab spoke with husband today.

## 2015-06-12 ENCOUNTER — Telehealth: Payer: Self-pay

## 2015-06-12 NOTE — Telephone Encounter (Signed)
Finished the prescribed meds, leg look better but still red and swollen. Pt wants to know if she needs to be prescribed more meds.  Please advise  [phone]

## 2015-06-13 ENCOUNTER — Ambulatory Visit (INDEPENDENT_AMBULATORY_CARE_PROVIDER_SITE_OTHER): Payer: Commercial Managed Care - HMO | Admitting: Family Medicine

## 2015-06-13 ENCOUNTER — Telehealth: Payer: Self-pay

## 2015-06-13 VITALS — BP 138/66 | HR 70 | Temp 98.5°F | Resp 18 | Ht 63.0 in | Wt 211.0 lb

## 2015-06-13 DIAGNOSIS — L03115 Cellulitis of right lower limb: Secondary | ICD-10-CM | POA: Diagnosis not present

## 2015-06-13 MED ORDER — CEFDINIR 300 MG PO CAPS
600.0000 mg | ORAL_CAPSULE | Freq: Every day | ORAL | Status: DC
Start: 2015-06-13 — End: 2015-06-23

## 2015-06-13 MED ORDER — CEFTRIAXONE SODIUM 1 G IJ SOLR
1.0000 g | Freq: Once | INTRAMUSCULAR | Status: AC
  Administered 2015-06-13: 1 g via INTRAMUSCULAR
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
## 2015-06-13 NOTE — Telephone Encounter (Signed)
Pt called. Evaluated by Dr. Linna Darner 4/28 for Left Lower Extremity cellulitis with 7 days of Cephalexin. She states Left leg better but this am Right leg is "hot and swollen" - pt wanted more abx.  Seeing Dr. Linna Darner on Monday.  Advised pt to return to clinic today for check of the right leg today.  Pt agreed and will come to office now.

## 2015-06-13 NOTE — Telephone Encounter (Signed)
Pt was seen

## 2015-06-13 NOTE — Patient Instructions (Addendum)
Please return if you're not significantly better in 48 hours.  Otherwise I need to check the progress on Monday.  Start the antibiotic pills tomorrow.   Cellulitis Cellulitis is an infection of the skin and the tissue beneath it. The infected area is usually red and tender. Cellulitis occurs most often in the arms and lower legs.  CAUSES  Cellulitis is caused by bacteria that enter the skin through cracks or cuts in the skin. The most common types of bacteria that cause cellulitis are staphylococci and streptococci. SIGNS AND SYMPTOMS   Redness and warmth.  Swelling.  Tenderness or pain.  Fever. DIAGNOSIS  Your health care provider can usually determine what is wrong based on a physical exam. Blood tests may also be done. TREATMENT  Treatment usually involves taking an antibiotic medicine. HOME CARE INSTRUCTIONS   Take your antibiotic medicine as directed by your health care provider. Finish the antibiotic even if you start to feel better.  Keep the infected arm or leg elevated to reduce swelling.  Apply a warm cloth to the affected area up to 4 times per day to relieve pain.  Take medicines only as directed by your health care provider.  Keep all follow-up visits as directed by your health care provider. SEEK MEDICAL CARE IF:   You notice red streaks coming from the infected area.  Your red area gets larger or turns dark in color.  Your bone or joint underneath the infected area becomes painful after the skin has healed.  Your infection returns in the same area or another area.  You notice a swollen bump in the infected area.  You develop new symptoms.  You have a fever. SEEK IMMEDIATE MEDICAL CARE IF:   You feel very sleepy.  You develop vomiting or diarrhea.  You have a general ill feeling (malaise) with muscle aches and pains.   This information is not intended to replace advice given to you by your health care provider. Make sure you discuss any questions  you have with your health care provider.   Document Released: 11/03/2004 Document Revised: 10/15/2014 Document Reviewed: 04/11/2011 Elsevier Interactive Patient Education [employer].

## 2015-06-15 ENCOUNTER — Ambulatory Visit (INDEPENDENT_AMBULATORY_CARE_PROVIDER_SITE_OTHER): Payer: Commercial Managed Care - HMO | Admitting: Family Medicine

## 2015-06-15 VITALS — BP 128/76 | HR 74 | Temp 98.2°F | Resp 18 | Ht 63.0 in

## 2015-06-15 DIAGNOSIS — I8311 Varicose veins of right lower extremity with inflammation: Secondary | ICD-10-CM

## 2015-06-15 DIAGNOSIS — R7989 Other specified abnormal findings of blood chemistry: Secondary | ICD-10-CM

## 2015-06-15 DIAGNOSIS — L03119 Cellulitis of unspecified part of limb: Secondary | ICD-10-CM

## 2015-06-15 DIAGNOSIS — I872 Venous insufficiency (chronic) (peripheral): Secondary | ICD-10-CM

## 2015-06-15 DIAGNOSIS — I8312 Varicose veins of left lower extremity with inflammation: Secondary | ICD-10-CM

## 2015-06-15 LAB — POCT CBC
Granulocyte percent: 68.7 %G (ref 37–80)
HCT, POC: 31.2 % — AB (ref 37.7–47.9)
HEMOGLOBIN: 11.2 g/dL — AB (ref 12.2–16.2)
LYMPH, POC: 0.8 (ref 0.6–3.4)
MCH: 32 pg — AB (ref 27–31.2)
MCHC: 35.9 g/dL — AB (ref 31.8–35.4)
MCV: 89.1 fL (ref 80–97)
MID (cbc): 0.5 (ref 0–0.9)
MPV: 7 fL (ref 0–99.8)
PLATELET COUNT, POC: 179 10*3/uL (ref 142–424)
POC Granulocyte: 3 (ref 2–6.9)
POC LYMPH PERCENT: 19.5 %L (ref 10–50)
POC MID %: 11.8 % (ref 0–12)
RBC: 3.5 M/uL — AB (ref 4.04–5.48)
RDW, POC: 14.2 %
WBC: 4.3 10*3/uL — AB (ref 4.6–10.2)

## 2015-06-15 LAB — BASIC METABOLIC PANEL
BUN: 24 mg/dL (ref 7–25)
CHLORIDE: 102 mmol/L (ref 98–110)
CO2: 20 mmol/L (ref 20–31)
Calcium: 9.8 mg/dL (ref 8.6–10.4)
Creat: 0.81 mg/dL (ref 0.60–0.88)
Glucose, Bld: 102 mg/dL — ABNORMAL HIGH (ref 65–99)
POTASSIUM: 4.3 mmol/L (ref 3.5–5.3)
Sodium: 135 mmol/L (ref 135–146)

## 2015-06-15 MED ORDER — CEFTRIAXONE SODIUM 1 G IJ SOLR
1.0000 g | Freq: Once | INTRAMUSCULAR | Status: AC
  Administered 2015-06-15: 1 g via INTRAMUSCULAR

## 2015-06-15 NOTE — Progress Notes (Signed)
Patient ID: Chelsea Browning, female    DOB: 1932-04-22  Age: 82 y.o. MRN: [id number]  Chief Complaint  Patient presents with  . Cellulitis    right leg    Subjective:   Patient is here for follow-up with regard to cellulitis in her legs. It got worse last week and she came in 2 days ago. Saw Dr. Milus Glazier who began her on Omnicef twice daily. She can't tolerate several medicines. She says is not quite as red as it was though both legs are still quite red. She has been continued to take the spironolactone. We long discussion about that.  Current allergies, medications, problem list, past/family and social histories reviewed.  Objective:  BP 128/76 mmHg  Pulse 74  Temp(Src) 98.2 F (36.8 C) (Oral)  Resp 18  Ht 5\' 3"  (1.6 m)  SpO2 98%  Her vital signs are good. Blood pressure is good. Chest clear. Heart regular without murmur. She has 2+ to 3+ pitting edema of her legs. No excoriations this time. However she has cellulitis from the upper calf down to the ankles. Bilaterally. No blistering yet.  Assessment & Plan:   Assessment: 1. Venous stasis dermatitis of both lower extremities   2. Azotemia   3. Cellulitis of lower extremity, unspecified laterality       Plan: Continue the antibiotics since it seems to be doing a little better. Gave another shot of Rocephin. Come back if at all worse and not improving. May need to switch to Doxy if not improving. See instructions.  Orders Placed This Encounter  Procedures  . Basic metabolic panel  . POCT CBC    Meds ordered this encounter  Medications  . cefTRIAXone (ROCEPHIN) injection 1 g    Sig:     Order Specific Question:  Antibiotic Indication:    Answer:  Cellulitis    Results for orders placed or performed in visit on 06/15/15  POCT CBC  Result Value Ref Range   WBC 4.3 (A) 4.6 - 10.2 K/uL   Lymph, poc 0.8 0.6 - 3.4   POC LYMPH PERCENT 19.5 10 - 50 %L   MID (cbc) 0.5 0 - 0.9   POC MID % 11.8 0 - 12 %M   POC  Granulocyte 3.0 2 - 6.9   Granulocyte percent 68.7 37 - 80 %G   RBC 3.50 (A) 4.04 - 5.48 M/uL   Hemoglobin 11.2 (A) 12.2 - 16.2 g/dL   HCT, POC 25.9 (A) 56.3 - 47.9 %   MCV 89.1 80 - 97 fL   MCH, POC 32.0 (A) 27 - 31.2 pg   MCHC 35.9 (A) 31.8 - 35.4 g/dL   RDW, POC 87.5 %   Platelet Count, POC 179 142 - 424 K/uL   MPV 7.0 0 - 99.8 fL        Patient Instructions   Wear the stockings and keep your feet elevated when possible  Continue the cefdinir  If you're not doing better in the next couple of days you might need to be changed to doxycycline, but within normal white blood count at this point we will continue the current meds.  Decrease the spironolactone to every other day. Drink plenty of fluids. I will let you know the results of the repeat set of labs to see what your BUN is doing.  Return if worse at any time.  I think you should get your labs rechecked again in 3 or 4 weeks.    IF you received an x-ray  today, you will receive an invoice from [facility]. Please contact [facility] at [phone] with questions or concerns regarding your invoice.   IF you received labwork today, you will receive an invoice from [employer]. Please contact [facility] at [phone] with questions or concerns regarding your invoice.   Our billing staff will not be able to assist you with questions regarding bills from these companies.  You will be contacted with the lab results as soon as they are available. The fastest way to get your results is to activate your My Chart account. Instructions are located on the last page of this paperwork. If you have not heard from [place] regarding the results in 2 weeks, please contact this office.         Return in about 4 weeks (around 07/13/2015), or if symptoms worsen or fail to improve.   Buford Bremer, MD 06/15/2015

## 2015-06-15 NOTE — Patient Instructions (Addendum)
Wear the stockings and keep your feet elevated when possible  Continue the cefdinir  If you're not doing better in the next couple of days you might need to be changed to doxycycline, but within normal white blood count at this point we will continue the current meds.  Decrease the spironolactone to every other day. Drink plenty of fluids. I will let you know the results of the repeat set of labs to see what your BUN is doing.  Return if worse at any time.  I think you should get your labs rechecked again in 3 or 4 weeks.    IF you received an x-ray today, you will receive an invoice from [facility]. Please contact [facility] at [phone] with questions or concerns regarding your invoice.   IF you received labwork today, you will receive an invoice from [employer]. Please contact [facility] at [phone] with questions or concerns regarding your invoice.   Our billing staff will not be able to assist you with questions regarding bills from these companies.  You will be contacted with the lab results as soon as they are available. The fastest way to get your results is to activate your My Chart account. Instructions are located on the last page of this paperwork. If you have not heard from [place] regarding the results in 2 weeks, please contact this office.

## 2015-06-16 NOTE — Telephone Encounter (Signed)
Message is for Dr Gilmore Laroche stated the right leg is no longer bright red it is light pink--with several outstanding blisters and she is wearing her hose and she would like to know if he thinks she needs to be on doxy or is it to soon?  Her call back number is [phone].

## 2015-06-17 NOTE — Telephone Encounter (Signed)
Pt was given Omnicef when she Dr Joseph Art - she should have 4 more days - I think it is a great sign that the redness is getting better and now it is pink.   Advise her to elevate at this time.

## 2015-06-17 NOTE — Telephone Encounter (Signed)
Dr. Linna Darner last day was yesterday, i will send to the PA pool.

## 2015-06-17 NOTE — Telephone Encounter (Signed)
Left message for pt to call back  °

## 2015-06-19 NOTE — Telephone Encounter (Signed)
Spoke to patient and advised her to continue her omnicef until it was all gone and keep the leg elevated as directed.  She states she should be finished with her medication by Monday and if no better she will RTC to be seen on Monday.  I told her that was fine.

## 2015-06-23 ENCOUNTER — Ambulatory Visit (INDEPENDENT_AMBULATORY_CARE_PROVIDER_SITE_OTHER): Payer: Commercial Managed Care - HMO | Admitting: Emergency Medicine

## 2015-06-23 VITALS — BP 160/80 | HR 66 | Temp 97.6°F | Resp 17 | Ht 63.0 in | Wt 221.0 lb

## 2015-06-23 DIAGNOSIS — R21 Rash and other nonspecific skin eruption: Secondary | ICD-10-CM | POA: Diagnosis not present

## 2015-06-23 DIAGNOSIS — L03115 Cellulitis of right lower limb: Secondary | ICD-10-CM

## 2015-06-23 DIAGNOSIS — Z945 Skin transplant status: Secondary | ICD-10-CM | POA: Insufficient documentation

## 2015-06-23 MED ORDER — CEFDINIR 300 MG PO CAPS: 600.0000 mg | ORAL_CAPSULE | Freq: Two times a day (BID) | ORAL | Status: DC

## 2015-06-23 NOTE — Patient Instructions (Signed)
     IF you received an x-ray today, you will receive an invoice from [facility]. Please contact [facility] at [phone] with questions or concerns regarding your invoice.   IF you received labwork today, you will receive an invoice from [facility]. Please contact [facility] at [phone] with questions or concerns regarding your invoice.   Our billing staff will not be able to assist you with questions regarding bills from these companies.  You will be contacted with the lab results as soon as they are available. The fastest way to get your results is to activate your My Chart account. Instructions are located on the last page of this paperwork. If you have not heard from us regarding the results in 2 weeks, please contact this office.      

## 2015-06-23 NOTE — Progress Notes (Signed)
Chelsea Browning  MRN: [id number] DOB: 01-25-1933  Subjective:  Pt presents to clinic for recheck of her leg cellulitis.  She finished the [facility] and her legs are not as red and painful.  They are very itchy though.  She has noticed less swelling in the left leg compared with the right leg.  She has had no fevers or chills.  She elevates her legs and using a compression machine for at least 30 mins a day.  She tries to keep her legs elevated but she is very active in the community.  Patient Active Problem List   Diagnosis Date Noted  . H/O skin graft 06/23/2015  . H/O Clostridium difficile infection 10/07/2014  . Preventative health care 03/24/2014  . Right hip pain 03/24/2014  . Obesity 07/24/2013  . Hiatal hernia with gastroesophageal reflux 07/24/2013  . HTN (hypertension) 07/24/2013  . Pedal edema 07/24/2013  . Carotid artery disease ([place]) 07/24/2013  . Arthritis   . Depression with anxiety   . HLD (hyperlipidemia)   . Hyponatremia   . Anemia 01/30/2013  . Stasis dermatitis of both legs 01/21/2013    Current Outpatient Prescriptions on File Prior to Visit  Medication Sig Dispense Refill  . acetaminophen (TYLENOL) 500 MG tablet Take 1,000 mg by mouth every 6 (six) hours as needed for moderate pain.    Marland Kitchen amLODipine (NORVASC) 5 MG tablet Take 5 mg by mouth daily.    Marland Kitchen atorvastatin (LIPITOR) 40 MG tablet Take 1 tablet (40 mg total) by mouth daily. 30 tablet 5  . dipyridamole (PERSANTINE) 75 MG tablet Take 1 tablet (75 mg total) by mouth 2 (two) times daily. 60 tablet 12  . labetalol (NORMODYNE) 200 MG tablet Take 1 tablet (200 mg total) by mouth 3 (three) times daily. 270 tablet 3  . losartan (COZAAR) 50 MG tablet Take 1 tablet (50 mg total) by mouth daily. 90 tablet 3  . omeprazole (PRILOSEC) 20 MG capsule Take 1 capsule by mouth before breakfast daily. 90 capsule 3  . spironolactone (ALDACTONE) 25 MG tablet Take 25 mg by mouth daily.    . Triamcinolone Acetonide (TRIAMCINOLONE  0.1 % CREAM : EUCERIN) CREA Use once or twice daily on the legs 1 each 3   No current facility-administered medications on file prior to visit.    Allergies  Allergen Reactions  . Augmentin [Amoxicillin-Pot Clavulanate] Hives  . Doxycycline Rash    Review of Systems  Constitutional: Negative for fever and chills.  Gastrointestinal: Negative for diarrhea.  Skin: Positive for wound.   Objective:  BP 160/80 mmHg  Pulse 66  Temp(Src) 97.6 F (36.4 C) (Oral)  Resp 17  Ht 5\' 3"  (1.6 m)  Wt 221 lb (100.245 kg)  BMI 39.16 kg/m2  SpO2 96%  Physical Exam  Constitutional: She is oriented to person, place, and time and well-developed, well-nourished, and in no distress.  HENT:  Head: Normocephalic and atraumatic.  Right Ear: Hearing and external ear normal.  Left Ear: Hearing and external ear normal.  Eyes: Conjunctivae are normal.  Neck: Normal range of motion.  Pulmonary/Chest: Effort normal.  Neurological: She is alert and oriented to person, place, and time. Gait normal.  Skin: Skin is warm and dry.     Psychiatric: Mood, memory, affect and judgment normal.  Vitals reviewed.   Assessment and Plan :  Cellulitis of right lower extremity - Plan: cefdinir (OMNICEF) 300 MG capsule  H/O skin graft   Continue abx for the resolving cellulitis - apply  an Haematologist today to help with the itching and swelling and stasis dermatitis - she will RTC in 5 days for removal the unna boot and recheck  D/w Dr Everlene Farrier  Windell Hummingbird PA-C  [facility] [place] [facility] 06/23/2015 11:01 AM

## 2015-06-24 ENCOUNTER — Telehealth: Payer: Self-pay

## 2015-06-24 NOTE — Telephone Encounter (Signed)
She should take 1 pill 2x/day

## 2015-06-24 NOTE — Telephone Encounter (Signed)
Pt is needing to talk with someone about the instructions of the medication that sarah gave her yesterday  Best number [phone]

## 2015-06-24 NOTE — Telephone Encounter (Signed)
cefdinir (OMNICEF) 300 MG capsule [id number]      Order Details    Dose: 600 mg Route: Oral Frequency: 2 times daily   Dispense Quantity:  20 capsule Refills:  0 Fills Remaining:  0          Sig: Take 2 capsules (600 mg total) by mouth 2 (two) times daily.              Pt wants to know if she takes two twice a day or two daily. The Rx says bid.

## 2015-06-24 NOTE — Telephone Encounter (Signed)
Called and spoke with the patient, informed of changes in her Rx.

## 2015-06-26 ENCOUNTER — Ambulatory Visit (INDEPENDENT_AMBULATORY_CARE_PROVIDER_SITE_OTHER): Payer: Commercial Managed Care - HMO | Admitting: Family Medicine

## 2015-06-26 VITALS — BP 130/70 | HR 70 | Temp 97.9°F | Resp 16 | Ht 63.0 in | Wt 221.4 lb

## 2015-06-26 DIAGNOSIS — L03119 Cellulitis of unspecified part of limb: Secondary | ICD-10-CM | POA: Diagnosis not present

## 2015-06-26 MED ORDER — MUPIROCIN 2 % EX OINT: 1.0000 "application " | TOPICAL_OINTMENT | Freq: Two times a day (BID) | CUTANEOUS | Status: DC

## 2015-06-26 NOTE — Progress Notes (Signed)
82 yo woman with ongoing cellulitis both legs.  She was given a shot of rocephin which helped, followed by two weeks of cefdinir and an unaboot three days ago.  Patient has had a hx of C.diff.  She's had no fever, chills or pain.  No diarrhea.  Objective:  BP 130/70 mmHg  Pulse 70  Temp(Src) 97.9 F (36.6 C) (Oral)  Resp 16  Ht 5\' 3"  (1.6 m)  Wt 221 lb 6.4 oz (100.426 kg)  BMI 39.23 kg/m2  SpO2 97% Both legs are pink from the knee down without bright erythema  Assessment:  Resolving cellulitis  Plan:  bactroban tid for the next week  Robyn Haber, MD

## 2015-06-26 NOTE — Patient Instructions (Addendum)
Use the ointment three times a day for a week.  Return if the redness or burning returns before.  Recheck in a week.

## 2015-07-11 ENCOUNTER — Ambulatory Visit (INDEPENDENT_AMBULATORY_CARE_PROVIDER_SITE_OTHER): Payer: Commercial Managed Care - HMO | Admitting: Emergency Medicine

## 2015-07-11 VITALS — BP 130/66 | HR 66 | Temp 98.1°F | Resp 16 | Wt 220.8 lb

## 2015-07-11 DIAGNOSIS — I872 Venous insufficiency (chronic) (peripheral): Secondary | ICD-10-CM

## 2015-07-11 DIAGNOSIS — L03119 Cellulitis of unspecified part of limb: Secondary | ICD-10-CM

## 2015-07-11 DIAGNOSIS — I8312 Varicose veins of left lower extremity with inflammation: Secondary | ICD-10-CM

## 2015-07-11 DIAGNOSIS — I8311 Varicose veins of right lower extremity with inflammation: Secondary | ICD-10-CM | POA: Diagnosis not present

## 2015-07-11 NOTE — Patient Instructions (Signed)
     IF you received an x-ray today, you will receive an invoice from [facility]. Please contact [facility] at [phone] with questions or concerns regarding your invoice.   IF you received labwork today, you will receive an invoice from [facility]. Please contact [facility] at [phone] with questions or concerns regarding your invoice.   Our billing staff will not be able to assist you with questions regarding bills from these companies.  You will be contacted with the lab results as soon as they are available. The fastest way to get your results is to activate your My Chart account. Instructions are located on the last page of this paperwork. If you have not heard from us regarding the results in 2 weeks, please contact this office.      
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
## 2015-07-20 ENCOUNTER — Telehealth: Payer: Self-pay

## 2015-07-20 NOTE — Telephone Encounter (Signed)
Patient states she was told by Dr. Everlene Farrier that it's okay for her to go swimming. Patient states that Dr. Everlene Farrier was great and wants to know Dr. Clayborn Heron opinion. Please advise! [phone]

## 2015-07-27 NOTE — Telephone Encounter (Signed)
I agree it is fine. Give her my regards.  I am told the practice policy will be that those of [place] who are "prn" (as needed part time) physicians cannot see patients as regular patients, so it will be best if she tried to get plugged into her new regular doctor.

## 2015-07-30 ENCOUNTER — Other Ambulatory Visit: Payer: Self-pay

## 2015-07-30 DIAGNOSIS — I10 Essential (primary) hypertension: Secondary | ICD-10-CM

## 2015-07-30 DIAGNOSIS — L03119 Cellulitis of unspecified part of limb: Secondary | ICD-10-CM

## 2015-07-30 MED ORDER — LABETALOL HCL 200 MG PO TABS: 200.0000 mg | ORAL_TABLET | Freq: Three times a day (TID) | ORAL | Status: DC

## 2015-07-30 MED ORDER — MUPIROCIN 2 % EX OINT: 1.0000 "application " | TOPICAL_OINTMENT | Freq: Two times a day (BID) | CUTANEOUS | Status: DC

## 2015-07-30 MED ORDER — ATORVASTATIN CALCIUM 40 MG PO TABS: 40.0000 mg | ORAL_TABLET | Freq: Every day | ORAL | Status: DC

## 2015-07-30 MED ORDER — LOSARTAN POTASSIUM 50 MG PO TABS: 50.0000 mg | ORAL_TABLET | Freq: Every day | ORAL | Status: DC

## 2015-07-30 NOTE — Telephone Encounter (Signed)
Pt advised.

## 2015-08-05 ENCOUNTER — Other Ambulatory Visit: Payer: Self-pay

## 2015-08-05 ENCOUNTER — Telehealth: Payer: Self-pay | Admitting: Behavioral Health

## 2015-08-05 NOTE — Telephone Encounter (Signed)
Unable to reach patient at time of Pre-Visit Call.  Left message for patient to return call when available.    

## 2015-08-05 NOTE — Telephone Encounter (Signed)
request for confirmation of Mupirocin 2% ointment-apply 1 (one) application (small amount) topically to area 2 (two) times per day.  -0- refills Returned fax to [place] [phone]

## 2015-08-05 NOTE — Telephone Encounter (Signed)
Patient voiced transportation issues at this time and rescheduled appointment for 11/23/15 at 2:30 PM.

## 2015-08-06 ENCOUNTER — Ambulatory Visit: Payer: Commercial Managed Care - HMO | Admitting: Family Medicine

## 2015-08-12 ENCOUNTER — Ambulatory Visit (INDEPENDENT_AMBULATORY_CARE_PROVIDER_SITE_OTHER): Payer: Commercial Managed Care - HMO | Admitting: Emergency Medicine

## 2015-08-12 VITALS — BP 126/76 | HR 69 | Temp 98.2°F | Resp 16 | Ht 63.0 in | Wt 227.0 lb

## 2015-08-12 DIAGNOSIS — I872 Venous insufficiency (chronic) (peripheral): Secondary | ICD-10-CM

## 2015-08-12 DIAGNOSIS — D649 Anemia, unspecified: Secondary | ICD-10-CM

## 2015-08-12 DIAGNOSIS — I8312 Varicose veins of left lower extremity with inflammation: Secondary | ICD-10-CM

## 2015-08-12 DIAGNOSIS — I8311 Varicose veins of right lower extremity with inflammation: Secondary | ICD-10-CM

## 2015-08-12 DIAGNOSIS — L03115 Cellulitis of right lower limb: Secondary | ICD-10-CM | POA: Diagnosis not present

## 2015-08-12 LAB — POCT CBC
GRANULOCYTE PERCENT: 68.7 % (ref 37–80)
HCT, POC: 27.8 % — AB (ref 37.7–47.9)
HEMOGLOBIN: 9.7 g/dL — AB (ref 12.2–16.2)
LYMPH, POC: 0.9 (ref 0.6–3.4)
MCH: 31.7 pg — AB (ref 27–31.2)
MCHC: 34.9 g/dL (ref 31.8–35.4)
MCV: 90.9 fL (ref 80–97)
MID (cbc): 0.3 (ref 0–0.9)
MPV: 6.3 fL (ref 0–99.8)
PLATELET COUNT, POC: 209 10*3/uL (ref 142–424)
POC GRANULOCYTE: 2.8 (ref 2–6.9)
POC LYMPH PERCENT: 22.8 %L (ref 10–50)
POC MID %: 8.5 %M (ref 0–12)
RBC: 3.06 M/uL — AB (ref 4.04–5.48)
RDW, POC: 15.9 %
WBC: 4.1 10*3/uL — AB (ref 4.6–10.2)

## 2015-08-12 LAB — FERRITIN: Ferritin: 207 ng/mL (ref 20–288)

## 2015-08-12 LAB — GLUCOSE, POCT (MANUAL RESULT ENTRY): POC GLUCOSE: 113 mg/dL — AB (ref 70–99)

## 2015-08-12 LAB — IRON AND TIBC
%SAT: 26 % (ref 11–50)
Iron: 76 ug/dL (ref 45–160)
TIBC: 297 ug/dL (ref 250–450)
UIBC: 221 ug/dL (ref 125–400)
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
## 2015-08-14 ENCOUNTER — Telehealth: Payer: Self-pay | Admitting: Emergency Medicine

## 2015-08-14 LAB — WOUND CULTURE
Gram Stain: NONE SEEN
Gram Stain: NONE SEEN
Gram Stain: NONE SEEN

## 2015-08-14 NOTE — Telephone Encounter (Signed)
Yes please advise that she loosen the bandage. Philis Fendt, MS, PA-C 11:03 AM, 08/14/2015

## 2015-08-14 NOTE — Telephone Encounter (Signed)
Patient states her ace bandage is too tight. It's making her itch. Patient stated a nurse live next door to her. She want to know if it's ok for the nurse to change the bandage. [phone].

## 2015-08-14 NOTE — Telephone Encounter (Signed)
I believe this is ok 

## 2015-08-15 ENCOUNTER — Ambulatory Visit: Payer: Commercial Managed Care - HMO

## 2015-08-15 ENCOUNTER — Ambulatory Visit (INDEPENDENT_AMBULATORY_CARE_PROVIDER_SITE_OTHER): Payer: Commercial Managed Care - HMO | Admitting: Family Medicine

## 2015-08-15 VITALS — BP 112/60 | HR 61 | Temp 97.8°F | Resp 18 | Ht 63.0 in | Wt 227.0 lb

## 2015-08-15 DIAGNOSIS — D649 Anemia, unspecified: Secondary | ICD-10-CM

## 2015-08-15 DIAGNOSIS — I872 Venous insufficiency (chronic) (peripheral): Secondary | ICD-10-CM

## 2015-08-15 DIAGNOSIS — I8311 Varicose veins of right lower extremity with inflammation: Secondary | ICD-10-CM | POA: Diagnosis not present

## 2015-08-15 DIAGNOSIS — Z9889 Other specified postprocedural states: Secondary | ICD-10-CM | POA: Diagnosis not present

## 2015-08-15 DIAGNOSIS — R600 Localized edema: Secondary | ICD-10-CM

## 2015-08-15 DIAGNOSIS — I8312 Varicose veins of left lower extremity with inflammation: Secondary | ICD-10-CM | POA: Diagnosis not present

## 2015-08-15 DIAGNOSIS — L03115 Cellulitis of right lower limb: Secondary | ICD-10-CM | POA: Diagnosis not present

## 2015-08-15 DIAGNOSIS — Z945 Skin transplant status: Secondary | ICD-10-CM

## 2015-08-15 LAB — POCT CBC
GRANULOCYTE PERCENT: 70.4 % (ref 37–80)
HEMATOCRIT: 27.1 % — AB (ref 37.7–47.9)
HEMOGLOBIN: 9.6 g/dL — AB (ref 12.2–16.2)
Lymph, poc: 1 (ref 0.6–3.4)
MCH: 32.1 pg — AB (ref 27–31.2)
MCHC: 35.5 g/dL — AB (ref 31.8–35.4)
MCV: 90.5 fL (ref 80–97)
MID (cbc): 0.3 (ref 0–0.9)
MPV: 6.4 fL (ref 0–99.8)
POC GRANULOCYTE: 3.2 (ref 2–6.9)
POC LYMPH PERCENT: 22.6 %L (ref 10–50)
POC MID %: 7 % (ref 0–12)
Platelet Count, POC: 210 10*3/uL (ref 142–424)
RBC: 3 M/uL — AB (ref 4.04–5.48)
RDW, POC: 15.1 %
WBC: 4.5 10*3/uL — AB (ref 4.6–10.2)

## 2015-08-15 MED ORDER — CEFDINIR 300 MG PO CAPS: 600.0000 mg | ORAL_CAPSULE | Freq: Every day | ORAL | Status: DC

## 2015-08-15 NOTE — Patient Instructions (Signed)
     IF you received an x-ray today, you will receive an invoice from [facility]. Please contact [facility] at [phone] with questions or concerns regarding your invoice.   IF you received labwork today, you will receive an invoice from [facility]. Please contact [facility] at [phone] with questions or concerns regarding your invoice.   Our billing staff will not be able to assist you with questions regarding bills from these companies.  You will be contacted with the lab results as soon as they are available. The fastest way to get your results is to activate your My Chart account. Instructions are located on the last page of this paperwork. If you have not heard from us regarding the results in 2 weeks, please contact this office.      
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
## 2015-08-17 NOTE — Telephone Encounter (Signed)
Left message, it is ok to have neighbor change her bandage.

## 2015-08-18 ENCOUNTER — Ambulatory Visit (INDEPENDENT_AMBULATORY_CARE_PROVIDER_SITE_OTHER): Payer: Commercial Managed Care - HMO | Admitting: Emergency Medicine

## 2015-08-18 VITALS — BP 132/80 | HR 89 | Temp 98.3°F | Resp 17 | Ht 63.0 in | Wt 222.0 lb

## 2015-08-18 DIAGNOSIS — I8311 Varicose veins of right lower extremity with inflammation: Secondary | ICD-10-CM | POA: Diagnosis not present

## 2015-08-18 DIAGNOSIS — L03116 Cellulitis of left lower limb: Secondary | ICD-10-CM | POA: Diagnosis not present

## 2015-08-18 DIAGNOSIS — L03115 Cellulitis of right lower limb: Secondary | ICD-10-CM | POA: Diagnosis not present

## 2015-08-18 DIAGNOSIS — I8312 Varicose veins of left lower extremity with inflammation: Secondary | ICD-10-CM

## 2015-08-18 DIAGNOSIS — I872 Venous insufficiency (chronic) (peripheral): Secondary | ICD-10-CM
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
## 2015-08-18 NOTE — Patient Instructions (Signed)
     IF you received an x-ray today, you will receive an invoice from [facility]. Please contact [facility] at [phone] with questions or concerns regarding your invoice.   IF you received labwork today, you will receive an invoice from [facility]. Please contact [facility] at [phone] with questions or concerns regarding your invoice.   Our billing staff will not be able to assist you with questions regarding bills from these companies.  You will be contacted with the lab results as soon as they are available. The fastest way to get your results is to activate your My Chart account. Instructions are located on the last page of this paperwork. If you have not heard from us regarding the results in 2 weeks, please contact this office.      

## 2015-08-19 ENCOUNTER — Other Ambulatory Visit: Payer: Self-pay | Admitting: *Deleted

## 2015-08-20 ENCOUNTER — Ambulatory Visit (HOSPITAL_BASED_OUTPATIENT_CLINIC_OR_DEPARTMENT_OTHER): Payer: Commercial Managed Care - HMO | Admitting: Hematology

## 2015-08-20 ENCOUNTER — Encounter: Payer: Self-pay | Admitting: Hematology

## 2015-08-20 ENCOUNTER — Other Ambulatory Visit (HOSPITAL_BASED_OUTPATIENT_CLINIC_OR_DEPARTMENT_OTHER): Payer: Commercial Managed Care - HMO

## 2015-08-20 ENCOUNTER — Telehealth: Payer: Self-pay | Admitting: Hematology

## 2015-08-20 VITALS — BP 155/51 | HR 60 | Temp 97.9°F | Resp 20 | Wt 235.6 lb

## 2015-08-20 DIAGNOSIS — I517 Cardiomegaly: Secondary | ICD-10-CM

## 2015-08-20 DIAGNOSIS — D801 Nonfamilial hypogammaglobulinemia: Secondary | ICD-10-CM | POA: Diagnosis not present

## 2015-08-20 DIAGNOSIS — E871 Hypo-osmolality and hyponatremia: Secondary | ICD-10-CM

## 2015-08-20 DIAGNOSIS — D649 Anemia, unspecified: Secondary | ICD-10-CM | POA: Diagnosis not present

## 2015-08-20 DIAGNOSIS — I10 Essential (primary) hypertension: Secondary | ICD-10-CM | POA: Diagnosis not present

## 2015-08-20 DIAGNOSIS — D472 Monoclonal gammopathy: Secondary | ICD-10-CM | POA: Diagnosis not present

## 2015-08-20 DIAGNOSIS — M1611 Unilateral primary osteoarthritis, right hip: Secondary | ICD-10-CM

## 2015-08-20 LAB — CBC & DIFF AND RETIC
BASO%: 0.4 % (ref 0.0–2.0)
Basophils Absolute: 0 10e3/uL (ref 0.0–0.1)
EOS%: 6.6 % (ref 0.0–7.0)
Eosinophils Absolute: 0.3 10e3/uL (ref 0.0–0.5)
HCT: 27 % — ABNORMAL LOW (ref 34.8–46.6)
HGB: 9.1 g/dL — ABNORMAL LOW (ref 11.6–15.9)
Immature Retic Fract: 8 % (ref 1.60–10.00)
LYMPH%: 15.9 % (ref 14.0–49.7)
MCH: 31.3 pg (ref 25.1–34.0)
MCHC: 33.7 g/dL (ref 31.5–36.0)
MCV: 92.8 fL (ref 79.5–101.0)
MONO#: 0.4 10e3/uL (ref 0.1–0.9)
MONO%: 7.9 % (ref 0.0–14.0)
NEUT#: 3.1 10e3/uL (ref 1.5–6.5)
NEUT%: 69.2 % (ref 38.4–76.8)
Platelets: 163 10e3/uL (ref 145–400)
RBC: 2.91 10e6/uL — ABNORMAL LOW (ref 3.70–5.45)
RDW: 14.5 % (ref 11.2–14.5)
Retic %: 1.84 % (ref 0.70–2.10)
Retic Ct Abs: 53.54 10e3/uL (ref 33.70–90.70)
WBC: 4.5 10e3/uL (ref 3.9–10.3)
lymph#: 0.7 10e3/uL — ABNORMAL LOW (ref 0.9–3.3)

## 2015-08-20 LAB — COMPREHENSIVE METABOLIC PANEL WITH GFR
ALT: 11 U/L (ref 0–55)
AST: 17 U/L (ref 5–34)
Albumin: 3.7 g/dL (ref 3.5–5.0)
Alkaline Phosphatase: 103 U/L (ref 40–150)
Anion Gap: 8 meq/L (ref 3–11)
BUN: 22 mg/dL (ref 7.0–26.0)
CO2: 24 meq/L (ref 22–29)
Calcium: 9.3 mg/dL (ref 8.4–10.4)
Chloride: 103 meq/L (ref 98–109)
Creatinine: 1 mg/dL (ref 0.6–1.1)
EGFR: 54 ml/min/1.73 m2 — ABNORMAL LOW
Glucose: 122 mg/dL (ref 70–140)
Potassium: 4.5 meq/L (ref 3.5–5.1)
Sodium: 135 meq/L — ABNORMAL LOW (ref 136–145)
Total Bilirubin: 0.36 mg/dL (ref 0.20–1.20)
Total Protein: 6.4 g/dL (ref 6.4–8.3)

## 2015-08-20 LAB — FERRITIN: Ferritin: 185 ng/mL (ref 9–269)

## 2015-08-20 NOTE — Telephone Encounter (Signed)
Gave patient avs report and appointments for September.  °

## 2015-08-21 ENCOUNTER — Telehealth: Payer: Self-pay

## 2015-08-21 ENCOUNTER — Ambulatory Visit: Payer: Commercial Managed Care - HMO | Admitting: Hematology

## 2015-08-21 LAB — MULTIPLE MYELOMA PANEL, SERUM
ALBUMIN SERPL ELPH-MCNC: 3.5 g/dL (ref 2.9–4.4)
ALPHA2 GLOB SERPL ELPH-MCNC: 0.7 g/dL (ref 0.4–1.0)
Albumin/Glob SerPl: 1.5 (ref 0.7–1.7)
Alpha 1: 0.2 g/dL (ref 0.0–0.4)
B-GLOBULIN SERPL ELPH-MCNC: 1 g/dL (ref 0.7–1.3)
GAMMA GLOB SERPL ELPH-MCNC: 0.5 g/dL (ref 0.4–1.8)
Globulin, Total: 2.4 g/dL (ref 2.2–3.9)
IGG (IMMUNOGLOBIN G), SERUM: 424 mg/dL — AB (ref 700–1600)
IgA, Qn, Serum: 84 mg/dL (ref 64–422)
IgM, Qn, Serum: 16 mg/dL — ABNORMAL LOW (ref 26–217)
TOTAL PROTEIN: 5.9 g/dL — AB (ref 6.0–8.5)

## 2015-08-21 NOTE — Telephone Encounter (Signed)
LMOVM @ [facility] with message from Dr. Gabriel Cirri culture was negative

## 2015-08-21 NOTE — Telephone Encounter (Signed)
-----   Message from Darlyne Russian, MD sent at 08/14/2015  2:47 PM EDT ----- Call culture negative

## 2015-08-24 ENCOUNTER — Telehealth: Payer: Self-pay

## 2015-08-24 ENCOUNTER — Other Ambulatory Visit: Payer: Commercial Managed Care - HMO

## 2015-08-24 ENCOUNTER — Ambulatory Visit: Payer: Commercial Managed Care - HMO | Admitting: Hematology

## 2015-08-24 NOTE — Telephone Encounter (Signed)
Pt saw Dr. Twana First MD today - seeing Daub on Wed.  Requests you review her labs prior to her visit - states she has protein in her blood.

## 2015-08-25 NOTE — Telephone Encounter (Signed)
I have reviewed her blood work and can discuss with her tomorrow. She has low immunoglobulins and her oncologist stated she might benefit from IV immune globulin if she has difficulty fighting infection. I would have to leave this up to her her oncologist.

## 2015-08-27 ENCOUNTER — Ambulatory Visit (INDEPENDENT_AMBULATORY_CARE_PROVIDER_SITE_OTHER): Payer: Commercial Managed Care - HMO | Admitting: Emergency Medicine

## 2015-08-27 ENCOUNTER — Encounter: Payer: Self-pay | Admitting: Emergency Medicine

## 2015-08-27 VITALS — BP 122/70 | HR 95 | Temp 98.9°F | Resp 18 | Ht 63.0 in | Wt 230.0 lb

## 2015-08-27 DIAGNOSIS — L03116 Cellulitis of left lower limb: Secondary | ICD-10-CM

## 2015-08-27 DIAGNOSIS — I872 Venous insufficiency (chronic) (peripheral): Secondary | ICD-10-CM

## 2015-08-27 DIAGNOSIS — I8312 Varicose veins of left lower extremity with inflammation: Secondary | ICD-10-CM

## 2015-08-27 DIAGNOSIS — I8311 Varicose veins of right lower extremity with inflammation: Secondary | ICD-10-CM | POA: Diagnosis not present

## 2015-08-27 DIAGNOSIS — L03115 Cellulitis of right lower limb: Secondary | ICD-10-CM

## 2015-08-27 DIAGNOSIS — D649 Anemia, unspecified: Secondary | ICD-10-CM | POA: Diagnosis not present

## 2015-08-27 LAB — POCT CBC
GRANULOCYTE PERCENT: 69.4 % (ref 37–80)
HCT, POC: 27.3 % — AB (ref 37.7–47.9)
HEMOGLOBIN: 9.4 g/dL — AB (ref 12.2–16.2)
Lymph, poc: 1.2 (ref 0.6–3.4)
MCH: 31.5 pg — AB (ref 27–31.2)
MCHC: 34.3 g/dL (ref 31.8–35.4)
MCV: 31.8 fL — AB (ref 80–97)
MID (cbc): 0.5 (ref 0–0.9)
MPV: 6.3 fL (ref 0–99.8)
PLATELET COUNT, POC: 171 10*3/uL (ref 142–424)
POC Granulocyte: 3.9 (ref 2–6.9)
POC LYMPH PERCENT: 21 %L (ref 10–50)
POC MID %: 9.6 %M (ref 0–12)
RBC: 2.97 M/uL — AB (ref 4.04–5.48)
RDW, POC: 16 %
WBC: 5.6 10*3/uL (ref 4.6–10.2)
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
## 2015-08-27 NOTE — Progress Notes (Signed)
Left leg unna boot applied and covered with ACE wrap

## 2015-08-27 NOTE — Patient Instructions (Signed)
     IF you received an x-ray today, you will receive an invoice from [facility]. Please contact [facility] at [phone] with questions or concerns regarding your invoice.   IF you received labwork today, you will receive an invoice from [facility]. Please contact [facility] at [phone] with questions or concerns regarding your invoice.   Our billing staff will not be able to assist you with questions regarding bills from these companies.  You will be contacted with the lab results as soon as they are available. The fastest way to get your results is to activate your My Chart account. Instructions are located on the last page of this paperwork. If you have not heard from us regarding the results in 2 weeks, please contact this office.      

## 2015-08-31 ENCOUNTER — Telehealth: Payer: Self-pay
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
## 2015-08-31 NOTE — Telephone Encounter (Signed)
Msg is for Daub, at what time scherry and Herbie Baltimore to come on Wednesday?  Please advise   [phone]

## 2015-09-02 ENCOUNTER — Ambulatory Visit (INDEPENDENT_AMBULATORY_CARE_PROVIDER_SITE_OTHER): Payer: Commercial Managed Care - HMO | Admitting: Emergency Medicine

## 2015-09-02 VITALS — BP 131/59 | HR 60 | Temp 97.7°F | Resp 16 | Ht 63.0 in | Wt 213.0 lb

## 2015-09-02 DIAGNOSIS — I8312 Varicose veins of left lower extremity with inflammation: Secondary | ICD-10-CM | POA: Diagnosis not present

## 2015-09-02 DIAGNOSIS — L03115 Cellulitis of right lower limb: Secondary | ICD-10-CM

## 2015-09-02 DIAGNOSIS — L03116 Cellulitis of left lower limb: Secondary | ICD-10-CM | POA: Diagnosis not present

## 2015-09-02 DIAGNOSIS — I8311 Varicose veins of right lower extremity with inflammation: Secondary | ICD-10-CM

## 2015-09-02 DIAGNOSIS — I872 Venous insufficiency (chronic) (peripheral): Secondary | ICD-10-CM

## 2015-09-02 NOTE — Patient Instructions (Signed)
     IF you received an x-ray today, you will receive an invoice from [facility]. Please contact  Radiology at [phone] with questions or concerns regarding your invoice.   IF you received labwork today, you will receive an invoice from [facility]. Please contact [facility] at [phone] with questions or concerns regarding your invoice.   Our billing staff will not be able to assist you with questions regarding bills from these companies.  You will be contacted with the lab results as soon as they are available. The fastest way to get your results is to activate your My Chart account. Instructions are located on the last page of this paperwork. If you have not heard from us regarding the results in 2 weeks, please contact this office.      
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
## 2015-09-03 NOTE — Progress Notes (Signed)
Thank you so much for helping . Richardson Landry

## 2015-09-09 ENCOUNTER — Ambulatory Visit (INDEPENDENT_AMBULATORY_CARE_PROVIDER_SITE_OTHER): Payer: Commercial Managed Care - HMO | Admitting: Emergency Medicine

## 2015-09-09 VITALS — BP 132/80 | HR 69 | Temp 98.2°F | Resp 17 | Ht 63.0 in | Wt 228.0 lb

## 2015-09-09 DIAGNOSIS — I872 Venous insufficiency (chronic) (peripheral): Secondary | ICD-10-CM

## 2015-09-09 DIAGNOSIS — L03115 Cellulitis of right lower limb: Secondary | ICD-10-CM

## 2015-09-09 DIAGNOSIS — I8311 Varicose veins of right lower extremity with inflammation: Secondary | ICD-10-CM

## 2015-09-09 DIAGNOSIS — I8312 Varicose veins of left lower extremity with inflammation: Secondary | ICD-10-CM
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
## 2015-09-09 NOTE — Patient Instructions (Signed)
     IF you received an x-ray today, you will receive an invoice from [facility]. Please contact [facility] at [phone] with questions or concerns regarding your invoice.   IF you received labwork today, you will receive an invoice from [facility]. Please contact [facility] at [phone] with questions or concerns regarding your invoice.   Our billing staff will not be able to assist you with questions regarding bills from these companies.  You will be contacted with the lab results as soon as they are available. The fastest way to get your results is to activate your My Chart account. Instructions are located on the last page of this paperwork. If you have not heard from us regarding the results in 2 weeks, please contact this office.      

## 2015-10-05 ENCOUNTER — Other Ambulatory Visit: Payer: Self-pay | Admitting: Family Medicine

## 2015-10-15 ENCOUNTER — Other Ambulatory Visit: Payer: Commercial Managed Care - HMO

## 2015-10-15 ENCOUNTER — Ambulatory Visit: Payer: Commercial Managed Care - HMO | Admitting: Hematology

## 2015-10-20 ENCOUNTER — Ambulatory Visit: Payer: Commercial Managed Care - HMO | Admitting: Hematology

## 2015-10-20 ENCOUNTER — Other Ambulatory Visit: Payer: Commercial Managed Care - HMO

## 2015-10-20 ENCOUNTER — Ambulatory Visit: Payer: Commercial Managed Care - HMO | Admitting: Internal Medicine

## 2015-10-26 ENCOUNTER — Ambulatory Visit (INDEPENDENT_AMBULATORY_CARE_PROVIDER_SITE_OTHER): Payer: Commercial Managed Care - HMO | Admitting: Emergency Medicine

## 2015-10-26 DIAGNOSIS — Z23 Encounter for immunization: Secondary | ICD-10-CM | POA: Diagnosis not present

## 2015-10-26 NOTE — Progress Notes (Signed)
Patient here for flu shot only which was administered.

## 2015-10-27 ENCOUNTER — Ambulatory Visit (HOSPITAL_BASED_OUTPATIENT_CLINIC_OR_DEPARTMENT_OTHER): Payer: Commercial Managed Care - HMO | Admitting: Hematology

## 2015-10-27 ENCOUNTER — Encounter: Payer: Self-pay | Admitting: Hematology

## 2015-10-27 ENCOUNTER — Other Ambulatory Visit (HOSPITAL_BASED_OUTPATIENT_CLINIC_OR_DEPARTMENT_OTHER): Payer: Commercial Managed Care - HMO

## 2015-10-27 ENCOUNTER — Telehealth: Payer: Self-pay | Admitting: Hematology

## 2015-10-27 VITALS — BP 154/57 | HR 69 | Temp 98.4°F | Resp 18 | Ht 63.0 in | Wt 228.0 lb

## 2015-10-27 DIAGNOSIS — D801 Nonfamilial hypogammaglobulinemia: Secondary | ICD-10-CM

## 2015-10-27 DIAGNOSIS — D649 Anemia, unspecified: Secondary | ICD-10-CM | POA: Diagnosis not present

## 2015-10-27 DIAGNOSIS — E871 Hypo-osmolality and hyponatremia: Secondary | ICD-10-CM

## 2015-10-27 DIAGNOSIS — I10 Essential (primary) hypertension: Secondary | ICD-10-CM | POA: Diagnosis not present

## 2015-10-27 DIAGNOSIS — I517 Cardiomegaly: Secondary | ICD-10-CM

## 2015-10-27 DIAGNOSIS — M1990 Unspecified osteoarthritis, unspecified site: Secondary | ICD-10-CM

## 2015-10-27 DIAGNOSIS — D472 Monoclonal gammopathy: Secondary | ICD-10-CM

## 2015-10-27 DIAGNOSIS — D469 Myelodysplastic syndrome, unspecified: Secondary | ICD-10-CM

## 2015-10-27 LAB — COMPREHENSIVE METABOLIC PANEL
ALBUMIN: 3.6 g/dL (ref 3.5–5.0)
ALK PHOS: 113 U/L (ref 40–150)
ALT: 13 U/L (ref 0–55)
ANION GAP: 8 meq/L (ref 3–11)
AST: 14 U/L (ref 5–34)
BUN: 25.6 mg/dL (ref 7.0–26.0)
CALCIUM: 9.4 mg/dL (ref 8.4–10.4)
CO2: 23 mEq/L (ref 22–29)
Chloride: 107 mEq/L (ref 98–109)
Creatinine: 1 mg/dL (ref 0.6–1.1)
EGFR: 55 mL/min/{1.73_m2} — AB (ref >90)
Glucose: 108 mg/dl (ref 70–140)
POTASSIUM: 4.6 meq/L (ref 3.5–5.1)
Sodium: 137 mEq/L (ref 136–145)
Total Bilirubin: 0.48 mg/dL (ref 0.20–1.20)
Total Protein: 6.5 g/dL (ref 6.4–8.3)

## 2015-10-27 LAB — CBC & DIFF AND RETIC
BASO%: 0 % (ref 0.0–2.0)
BASOS ABS: 0 10*3/uL (ref 0.0–0.1)
EOS ABS: 0.2 10*3/uL (ref 0.0–0.5)
EOS%: 3 % (ref 0.0–7.0)
HEMATOCRIT: 30.7 % — AB (ref 34.8–46.6)
HEMOGLOBIN: 10.4 g/dL — AB (ref 11.6–15.9)
Immature Retic Fract: 7.4 % (ref 1.60–10.00)
LYMPH%: 13.8 % — AB (ref 14.0–49.7)
MCH: 30.9 pg (ref 25.1–34.0)
MCHC: 33.9 g/dL (ref 31.5–36.0)
MCV: 91.1 fL (ref 79.5–101.0)
MONO#: 0.6 10*3/uL (ref 0.1–0.9)
MONO%: 10.1 % (ref 0.0–14.0)
NEUT#: 4.4 10*3/uL (ref 1.5–6.5)
NEUT%: 73.1 % (ref 38.4–76.8)
PLATELETS: 158 10*3/uL (ref 145–400)
RBC: 3.37 10*6/uL — ABNORMAL LOW (ref 3.70–5.45)
RDW: 14.2 % (ref 11.2–14.5)
Retic %: 1.25 % (ref 0.70–2.10)
Retic Ct Abs: 42.13 10*3/uL (ref 33.70–90.70)
WBC: 6 10*3/uL (ref 3.9–10.3)
lymph#: 0.8 10*3/uL — ABNORMAL LOW (ref 0.9–3.3)

## 2015-10-27 NOTE — Telephone Encounter (Signed)
Gave patient avs report and appointments for January  °

## 2015-10-28 LAB — FERRITIN: FERRITIN: 186 ng/mL (ref 9–269)
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
## 2015-11-23 ENCOUNTER — Ambulatory Visit: Payer: Commercial Managed Care - HMO | Admitting: Family Medicine

## 2015-12-02 ENCOUNTER — Ambulatory Visit (INDEPENDENT_AMBULATORY_CARE_PROVIDER_SITE_OTHER): Payer: Commercial Managed Care - HMO | Admitting: Family Medicine

## 2015-12-02 ENCOUNTER — Encounter: Payer: Self-pay | Admitting: Family Medicine

## 2015-12-02 VITALS — BP 160/68 | HR 68 | Temp 98.0°F | Resp 20 | Ht 63.0 in | Wt 227.6 lb

## 2015-12-02 DIAGNOSIS — B3731 Acute candidiasis of vulva and vagina: Secondary | ICD-10-CM

## 2015-12-02 DIAGNOSIS — B373 Candidiasis of vulva and vagina: Secondary | ICD-10-CM

## 2015-12-02 DIAGNOSIS — L298 Other pruritus: Secondary | ICD-10-CM | POA: Diagnosis not present

## 2015-12-02 DIAGNOSIS — N3941 Urge incontinence: Secondary | ICD-10-CM

## 2015-12-02 DIAGNOSIS — Z23 Encounter for immunization: Secondary | ICD-10-CM

## 2015-12-02 DIAGNOSIS — N898 Other specified noninflammatory disorders of vagina: Secondary | ICD-10-CM

## 2015-12-02 MED ORDER — CLOTRIMAZOLE-BETAMETHASONE 1-0.05 % EX CREA: 1.0000 "application " | TOPICAL_CREAM | Freq: Two times a day (BID) | CUTANEOUS | 0 refills | Status: DC

## 2015-12-02 MED ORDER — FLUCONAZOLE 150 MG PO TABS: 150.0000 mg | ORAL_TABLET | Freq: Once | ORAL | 0 refills | Status: AC
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
## 2015-12-02 NOTE — Patient Instructions (Addendum)
Keep area clean and dry during the day. Apply prescription topical cream to area twice daily. Continue to use Desitin throughout the day. Take Diflucan one daily; take second tablet three days later.Pneumococcal Vaccine, Polyvalent suspension for injection What is this medicine? PNEUMOCOCCAL VACCINE (NEU mo KOK al vak SEEN) is a vaccine used to prevent pneumococcus bacterial infections. These bacteria can cause serious infections like pneumonia, meningitis, and blood infections. This vaccine will lower your chance of getting pneumonia. If you do get pneumonia, it can make your symptoms milder and your illness shorter. This vaccine will not treat an infection and will not cause infection. This vaccine is recommended for infants and young children, adults with certain medical conditions, and adults 39 years or older. This medicine may be used for other purposes; ask your health care provider or pharmacist if you have questions. What should I tell my health care provider before I take this medicine? They need to know if you have any of these conditions: -bleeding problems -fever -immune system problems -an unusual or allergic reaction to pneumococcal vaccine, diphtheria toxoid, other vaccines, latex, other medicines, foods, dyes, or preservatives -pregnant or trying to get pregnant -breast-feeding How should I use this medicine? This vaccine is for injection into a muscle. It is given by a health care professional. A copy of Vaccine Information Statements will be given before each vaccination. Read this sheet carefully each time. The sheet may change frequently. Talk to your pediatrician regarding the use of this medicine in children. While this drug may be prescribed for children as young as 41 weeks old for selected conditions, precautions do apply. Overdosage: If you think you have taken too much of this medicine contact a poison control center or emergency room at once. NOTE: This medicine is  only for you. Do not share this medicine with others. What if I miss a dose? It is important not to miss your dose. Call your doctor or health care professional if you are unable to keep an appointment. What may interact with this medicine? -medicines for cancer chemotherapy -medicines that suppress your immune function -steroid medicines like prednisone or cortisone This list may not describe all possible interactions. Give your health care provider a list of all the medicines, herbs, non-prescription drugs, or dietary supplements you use. Also tell them if you smoke, drink alcohol, or use illegal drugs. Some items may interact with your medicine. What should I watch for while using this medicine? Mild fever and pain should go away in 3 days or less. Report any unusual symptoms to your doctor or health care professional. What side effects may I notice from receiving this medicine? Side effects that you should report to your doctor or health care professional as soon as possible: -allergic reactions like skin rash, itching or hives, swelling of the face, lips, or tongue -breathing problems -confused -fast or irregular heartbeat -fever over 102 degrees F -seizures -unusual bleeding or bruising -unusual muscle weakness Side effects that usually do not require medical attention (report to your doctor or health care professional if they continue or are bothersome): -aches and pains -diarrhea -fever of 102 degrees F or less -headache -irritable -loss of appetite -pain, tender at site where injected -trouble sleeping This list may not describe all possible side effects. Call your doctor for medical advice about side effects. You may report side effects to FDA at 1-800-FDA-1088. Where should I keep my medicine? This does not apply. This vaccine is given in a clinic, pharmacy, doctor's  office, or other health care setting and will not be stored at home. NOTE: This sheet is a summary. It may  not cover all possible information. If you have questions about this medicine, talk to your doctor, pharmacist, or health care provider.    2016, Elsevier/Gold Standard. (2013-10-31 10:27:27)

## 2015-12-07 ENCOUNTER — Telehealth: Payer: Self-pay

## 2015-12-07 MED ORDER — CLOTRIMAZOLE-BETAMETHASONE 1-0.05 % EX CREA: 1.0000 "application " | TOPICAL_CREAM | Freq: Two times a day (BID) | CUTANEOUS | 0 refills | Status: DC

## 2015-12-07 NOTE — Telephone Encounter (Signed)
PT would like a refill on the clotrimazole- betamethasone cream dr. Tamala Julian prescribed as she is running low and will not have enough to last until her appointment Wednesday    Contact: [phone]

## 2015-12-07 NOTE — Telephone Encounter (Signed)
Sent in a refill so that pt could continue treatment until her follow up on Wed. Notified pt and she stated she probably had been wasteful since it ran out so quickly, but she will see Dr Tamala Julian on Wed.

## 2015-12-09 ENCOUNTER — Ambulatory Visit: Payer: Commercial Managed Care - HMO | Admitting: Internal Medicine

## 2015-12-09 ENCOUNTER — Ambulatory Visit (INDEPENDENT_AMBULATORY_CARE_PROVIDER_SITE_OTHER): Payer: Commercial Managed Care - HMO | Admitting: Family Medicine

## 2015-12-09 ENCOUNTER — Encounter: Payer: Self-pay | Admitting: Family Medicine

## 2015-12-09 VITALS — BP 134/82 | HR 82 | Temp 98.4°F | Resp 18 | Ht 63.0 in | Wt 220.0 lb

## 2015-12-09 DIAGNOSIS — M1611 Unilateral primary osteoarthritis, right hip: Secondary | ICD-10-CM | POA: Diagnosis not present

## 2015-12-09 DIAGNOSIS — L298 Other pruritus: Secondary | ICD-10-CM

## 2015-12-09 DIAGNOSIS — M25561 Pain in right knee: Secondary | ICD-10-CM

## 2015-12-09 DIAGNOSIS — B379 Candidiasis, unspecified: Secondary | ICD-10-CM

## 2015-12-09 DIAGNOSIS — N898 Other specified noninflammatory disorders of vagina: Secondary | ICD-10-CM

## 2015-12-09 MED ORDER — LIDOCAINE HCL 2 % EX GEL: 1.0000 "application " | CUTANEOUS | 0 refills | Status: DC | PRN

## 2015-12-09 NOTE — Patient Instructions (Addendum)
1. Continue betamethasone-clotrimazole cream. 2.  Use topical lidocaine as needed for pain.    IF you received an x-ray today, you will receive an invoice from [facility]. Please contact [facility] at [phone] with questions or concerns regarding your invoice.   IF you received labwork today, you will receive an invoice from [employer]. Please contact [facility] at [phone] with questions or concerns regarding your invoice.   Our billing staff will not be able to assist you with questions regarding bills from these companies.  You will be contacted with the lab results as soon as they are available. The fastest way to get your results is to activate your My Chart account. Instructions are located on the last page of this paperwork. If you have not heard from [place] regarding the results in 2 weeks, please contact this office.
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
## 2015-12-16 ENCOUNTER — Ambulatory Visit (INDEPENDENT_AMBULATORY_CARE_PROVIDER_SITE_OTHER): Payer: Commercial Managed Care - HMO

## 2015-12-16 ENCOUNTER — Ambulatory Visit (INDEPENDENT_AMBULATORY_CARE_PROVIDER_SITE_OTHER): Payer: Commercial Managed Care - HMO | Admitting: Family Medicine

## 2015-12-16 ENCOUNTER — Encounter: Payer: Self-pay | Admitting: Family Medicine

## 2015-12-16 VITALS — BP 136/80 | HR 69 | Temp 97.7°F | Resp 16 | Ht 64.0 in | Wt 224.2 lb

## 2015-12-16 DIAGNOSIS — I10 Essential (primary) hypertension: Secondary | ICD-10-CM

## 2015-12-16 DIAGNOSIS — B3731 Acute candidiasis of vulva and vagina: Secondary | ICD-10-CM

## 2015-12-16 DIAGNOSIS — B373 Candidiasis of vulva and vagina: Secondary | ICD-10-CM | POA: Diagnosis not present

## 2015-12-16 DIAGNOSIS — E7800 Pure hypercholesterolemia, unspecified: Secondary | ICD-10-CM | POA: Diagnosis not present

## 2015-12-16 DIAGNOSIS — D649 Anemia, unspecified: Secondary | ICD-10-CM

## 2015-12-16 DIAGNOSIS — D801 Nonfamilial hypogammaglobulinemia: Secondary | ICD-10-CM

## 2015-12-16 DIAGNOSIS — M25561 Pain in right knee: Secondary | ICD-10-CM

## 2015-12-16 DIAGNOSIS — M1712 Unilateral primary osteoarthritis, left knee: Secondary | ICD-10-CM | POA: Diagnosis not present

## 2015-12-16 DIAGNOSIS — M25562 Pain in left knee: Secondary | ICD-10-CM

## 2015-12-16 DIAGNOSIS — M1711 Unilateral primary osteoarthritis, right knee: Secondary | ICD-10-CM | POA: Diagnosis not present

## 2015-12-16 DIAGNOSIS — G8929 Other chronic pain: Secondary | ICD-10-CM

## 2015-12-16 LAB — COMPREHENSIVE METABOLIC PANEL
ALBUMIN: 4.6 g/dL (ref 3.6–5.1)
ALK PHOS: 91 U/L (ref 33–130)
ALT: 11 U/L (ref 6–29)
AST: 19 U/L (ref 10–35)
BILIRUBIN TOTAL: 0.7 mg/dL (ref 0.2–1.2)
BUN: 23 mg/dL (ref 7–25)
CALCIUM: 9.7 mg/dL (ref 8.6–10.4)
CO2: 22 mmol/L (ref 20–31)
Chloride: 99 mmol/L (ref 98–110)
Creat: 0.79 mg/dL (ref 0.60–0.88)
Glucose, Bld: 103 mg/dL — ABNORMAL HIGH (ref 65–99)
POTASSIUM: 4.6 mmol/L (ref 3.5–5.3)
Sodium: 132 mmol/L — ABNORMAL LOW (ref 135–146)
Total Protein: 6.5 g/dL (ref 6.1–8.1)

## 2015-12-16 LAB — POCT CBC
GRANULOCYTE PERCENT: 78.6 % (ref 37–80)
HEMATOCRIT: 32.2 % — AB (ref 37.7–47.9)
Hemoglobin: 11.6 g/dL — AB (ref 12.2–16.2)
Lymph, poc: 1.1 (ref 0.6–3.4)
MCH: 32.1 pg — AB (ref 27–31.2)
MCHC: 36.1 g/dL — AB (ref 31.8–35.4)
MCV: 89 fL (ref 80–97)
MID (CBC): 0.2 (ref 0–0.9)
MPV: 6.5 fL (ref 0–99.8)
PLATELET COUNT, POC: 197 10*3/uL (ref 142–424)
POC Granulocyte: 4.9 (ref 2–6.9)
POC LYMPH %: 17.9 % (ref 10–50)
POC MID %: 3.5 % (ref 0–12)
RBC: 3.62 M/uL — AB (ref 4.04–5.48)
RDW, POC: 15.8 %
WBC: 6.2 10*3/uL (ref 4.6–10.2)

## 2015-12-16 LAB — LIPID PANEL
CHOL/HDL RATIO: 3.2 ratio (ref <5.0)
Cholesterol: 226 mg/dL — ABNORMAL HIGH (ref <200)
HDL: 71 mg/dL (ref >50)
LDL CALC: 131 mg/dL — AB
TRIGLYCERIDES: 120 mg/dL (ref <150)
VLDL: 24 mg/dL (ref <30)

## 2015-12-16 MED ORDER — HYDROCODONE-ACETAMINOPHEN 5-325 MG PO TABS: 1.0000 | ORAL_TABLET | Freq: Four times a day (QID) | ORAL | 0 refills | Status: AC | PRN
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
## 2015-12-16 NOTE — Patient Instructions (Signed)
     IF you received an x-ray today, you will receive an invoice from [facility]. Please contact [facility] at [phone] with questions or concerns regarding your invoice.   IF you received labwork today, you will receive an invoice from [facility]. Please contact [facility] at [phone] with questions or concerns regarding your invoice.   Our billing staff will not be able to assist you with questions regarding bills from these companies.  You will be contacted with the lab results as soon as they are available. The fastest way to get your results is to activate your My Chart account. Instructions are located on the last page of this paperwork. If you have not heard from us regarding the results in 2 weeks, please contact this office.      

## 2015-12-23 ENCOUNTER — Ambulatory Visit (INDEPENDENT_AMBULATORY_CARE_PROVIDER_SITE_OTHER): Payer: Commercial Managed Care - HMO | Admitting: Orthopedic Surgery

## 2015-12-24 ENCOUNTER — Telehealth: Payer: Self-pay

## 2015-12-24 ENCOUNTER — Ambulatory Visit: Payer: Commercial Managed Care - HMO | Admitting: Family Medicine

## 2015-12-24 NOTE — Telephone Encounter (Signed)
Pt calling in regards to ortho referral she would like to see Dr. Mardelle Matte at [facility] as she is already established with them she scheduled her appointment with them for Monday November 27th I will be able to send her referral as soon as the ov notes are completed    Thanks!

## 2015-12-28 ENCOUNTER — Ambulatory Visit (INDEPENDENT_AMBULATORY_CARE_PROVIDER_SITE_OTHER): Payer: Commercial Managed Care - HMO | Admitting: Orthopedic Surgery

## 2016-01-04 ENCOUNTER — Ambulatory Visit (INDEPENDENT_AMBULATORY_CARE_PROVIDER_SITE_OTHER): Payer: Commercial Managed Care - HMO | Admitting: Orthopedic Surgery

## 2016-01-06 ENCOUNTER — Telehealth: Payer: Self-pay

## 2016-01-06 ENCOUNTER — Other Ambulatory Visit: Payer: Self-pay

## 2016-01-06 NOTE — Telephone Encounter (Signed)
Needs [employer] auth for Dr. Wynonia Lawman. Apt Monday 12/4, ICD I11.9, dr Tollie Eth NPI [id number]

## 2016-01-07 NOTE — Telephone Encounter (Signed)
Authorized and faxed to [facility] office

## 2016-01-11 ENCOUNTER — Other Ambulatory Visit: Payer: Self-pay | Admitting: Family Medicine

## 2016-01-11 NOTE — Telephone Encounter (Signed)
12/2015 last ov and lab

## 2016-02-04 ENCOUNTER — Ambulatory Visit: Payer: Commercial Managed Care - HMO

## 2016-02-11 ENCOUNTER — Ambulatory Visit (INDEPENDENT_AMBULATORY_CARE_PROVIDER_SITE_OTHER): Payer: Medicare HMO | Admitting: Family Medicine

## 2016-02-11 ENCOUNTER — Encounter: Payer: Self-pay | Admitting: Family Medicine

## 2016-02-11 VITALS — BP 122/78 | HR 72 | Temp 97.8°F | Resp 16 | Ht 64.0 in | Wt 216.0 lb

## 2016-02-11 DIAGNOSIS — R600 Localized edema: Secondary | ICD-10-CM | POA: Diagnosis not present

## 2016-02-11 DIAGNOSIS — I872 Venous insufficiency (chronic) (peripheral): Secondary | ICD-10-CM

## 2016-02-11 DIAGNOSIS — M1611 Unilateral primary osteoarthritis, right hip: Secondary | ICD-10-CM | POA: Diagnosis not present

## 2016-02-11 DIAGNOSIS — E7800 Pure hypercholesterolemia, unspecified: Secondary | ICD-10-CM

## 2016-02-11 DIAGNOSIS — I10 Essential (primary) hypertension: Secondary | ICD-10-CM
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
## 2016-02-11 NOTE — Patient Instructions (Signed)
     IF you received an x-ray today, you will receive an invoice from [facility]. Please contact [facility] at [phone] with questions or concerns regarding your invoice.   IF you received labwork today, you will receive an invoice from [facility]. Please contact [facility] at [phone] with questions or concerns regarding your invoice.   Our billing staff will not be able to assist you with questions regarding bills from these companies.  You will be contacted with the lab results as soon as they are available. The fastest way to get your results is to activate your My Chart account. Instructions are located on the last page of this paperwork. If you have not heard from us regarding the results in 2 weeks, please contact this office.     

## 2016-02-12 LAB — CBC WITH DIFFERENTIAL/PLATELET
Basophils Absolute: 0 10*3/uL (ref 0.0–0.2)
Basos: 0 %
EOS (ABSOLUTE): 0.1 10*3/uL (ref 0.0–0.4)
EOS: 2 %
Hematocrit: 30.8 % — ABNORMAL LOW (ref 34.0–46.6)
Hemoglobin: 10.3 g/dL — ABNORMAL LOW (ref 11.1–15.9)
IMMATURE GRANULOCYTES: 0 %
Immature Grans (Abs): 0 10*3/uL (ref 0.0–0.1)
Lymphocytes Absolute: 0.8 10*3/uL (ref 0.7–3.1)
Lymphs: 16 %
MCH: 31.5 pg (ref 26.6–33.0)
MCHC: 33.4 g/dL (ref 31.5–35.7)
MCV: 94 fL (ref 79–97)
MONOS ABS: 0.4 10*3/uL (ref 0.1–0.9)
Monocytes: 8 %
NEUTROS PCT: 74 %
Neutrophils Absolute: 3.7 10*3/uL (ref 1.4–7.0)
PLATELETS: 180 10*3/uL (ref 150–379)
RBC: 3.27 x10E6/uL — AB (ref 3.77–5.28)
RDW: 15.7 % — AB (ref 12.3–15.4)
WBC: 5.1 10*3/uL (ref 3.4–10.8)

## 2016-02-12 LAB — COMPREHENSIVE METABOLIC PANEL
ALT: 13 IU/L (ref 0–32)
AST: 16 IU/L (ref 0–40)
Albumin/Globulin Ratio: 1.8 (ref 1.2–2.2)
Albumin: 4.4 g/dL (ref 3.5–4.7)
Alkaline Phosphatase: 104 IU/L (ref 39–117)
BUN/Creatinine Ratio: 44 — ABNORMAL HIGH (ref 12–28)
BUN: 35 mg/dL — ABNORMAL HIGH (ref 8–27)
Bilirubin Total: 0.4 mg/dL (ref 0.0–1.2)
CALCIUM: 9.8 mg/dL (ref 8.7–10.3)
CO2: 20 mmol/L (ref 18–29)
CREATININE: 0.79 mg/dL (ref 0.57–1.00)
Chloride: 104 mmol/L (ref 96–106)
GFR calc Af Amer: 81 mL/min/{1.73_m2} (ref >59)
GFR, EST NON AFRICAN AMERICAN: 70 mL/min/{1.73_m2} (ref >59)
Globulin, Total: 2.4 g/dL (ref 1.5–4.5)
Glucose: 98 mg/dL (ref 65–99)
POTASSIUM: 4.5 mmol/L (ref 3.5–5.2)
Sodium: 140 mmol/L (ref 134–144)
Total Protein: 6.8 g/dL (ref 6.0–8.5)

## 2016-02-12 LAB — LDL CHOLESTEROL, DIRECT: LDL Direct: 128 mg/dL — ABNORMAL HIGH (ref 0–99)

## 2016-02-17 ENCOUNTER — Telehealth: Payer: Self-pay

## 2016-02-17 NOTE — Telephone Encounter (Signed)
Pt wants to know her lab results.  Labs were done a week ago.  [phone]

## 2016-02-17 NOTE — Telephone Encounter (Signed)
Please review and advise (some abnormals)

## 2016-02-18 NOTE — Telephone Encounter (Signed)
Call -- 1.  LDL cholesterol still slightly elevated at 128. Is she taking Lipitor 40mg  every night?  2.  Hemoglobin is 10.3 which is lower than last visit.  3. Sugar is normal.  4. Liver and kidney functions are normal.

## 2016-02-18 NOTE — Telephone Encounter (Signed)
Pt calling about labs done last week please review

## 2016-02-22 NOTE — Telephone Encounter (Signed)
Pt advised and is taking her lipitor every night

## 2016-02-23 ENCOUNTER — Other Ambulatory Visit (HOSPITAL_BASED_OUTPATIENT_CLINIC_OR_DEPARTMENT_OTHER): Payer: Medicare HMO

## 2016-02-23 ENCOUNTER — Encounter: Payer: Self-pay | Admitting: Hematology

## 2016-02-23 ENCOUNTER — Telehealth: Payer: Self-pay | Admitting: Hematology

## 2016-02-23 ENCOUNTER — Ambulatory Visit (HOSPITAL_BASED_OUTPATIENT_CLINIC_OR_DEPARTMENT_OTHER): Payer: Medicare HMO | Admitting: Hematology

## 2016-02-23 VITALS — BP 176/73 | HR 57 | Temp 97.9°F | Resp 18

## 2016-02-23 DIAGNOSIS — D649 Anemia, unspecified: Secondary | ICD-10-CM

## 2016-02-23 DIAGNOSIS — D508 Other iron deficiency anemias: Secondary | ICD-10-CM

## 2016-02-23 DIAGNOSIS — D801 Nonfamilial hypogammaglobulinemia: Secondary | ICD-10-CM

## 2016-02-23 DIAGNOSIS — I10 Essential (primary) hypertension: Secondary | ICD-10-CM | POA: Diagnosis not present

## 2016-02-23 DIAGNOSIS — D469 Myelodysplastic syndrome, unspecified: Secondary | ICD-10-CM

## 2016-02-23 DIAGNOSIS — D472 Monoclonal gammopathy: Secondary | ICD-10-CM

## 2016-02-23 DIAGNOSIS — M1611 Unilateral primary osteoarthritis, right hip: Secondary | ICD-10-CM

## 2016-02-23 DIAGNOSIS — I517 Cardiomegaly: Secondary | ICD-10-CM

## 2016-02-23 LAB — CBC & DIFF AND RETIC
BASO%: 0.2 % (ref 0.0–2.0)
Basophils Absolute: 0 10*3/uL (ref 0.0–0.1)
EOS%: 2.9 % (ref 0.0–7.0)
Eosinophils Absolute: 0.1 10*3/uL (ref 0.0–0.5)
HCT: 29.6 % — ABNORMAL LOW (ref 34.8–46.6)
HEMOGLOBIN: 9.7 g/dL — AB (ref 11.6–15.9)
IMMATURE RETIC FRACT: 11.2 % — AB (ref 1.60–10.00)
LYMPH%: 19.9 % (ref 14.0–49.7)
MCH: 30.5 pg (ref 25.1–34.0)
MCHC: 32.8 g/dL (ref 31.5–36.0)
MCV: 93.1 fL (ref 79.5–101.0)
MONO#: 0.4 10*3/uL (ref 0.1–0.9)
MONO%: 9.2 % (ref 0.0–14.0)
NEUT%: 67.8 % (ref 38.4–76.8)
NEUTROS ABS: 3 10*3/uL (ref 1.5–6.5)
PLATELETS: 167 10*3/uL (ref 145–400)
RBC: 3.18 10*6/uL — AB (ref 3.70–5.45)
RDW: 14.9 % — ABNORMAL HIGH (ref 11.2–14.5)
Retic %: 2.15 % — ABNORMAL HIGH (ref 0.70–2.10)
Retic Ct Abs: 68.37 10*3/uL (ref 33.70–90.70)
WBC: 4.5 10*3/uL (ref 3.9–10.3)
lymph#: 0.9 10*3/uL (ref 0.9–3.3)

## 2016-02-23 LAB — COMPREHENSIVE METABOLIC PANEL
ALBUMIN: 4.2 g/dL (ref 3.5–5.0)
ALT: 19 U/L (ref 0–55)
ANION GAP: 9 meq/L (ref 3–11)
AST: 22 U/L (ref 5–34)
Alkaline Phosphatase: 108 U/L (ref 40–150)
BUN: 32.6 mg/dL — ABNORMAL HIGH (ref 7.0–26.0)
CO2: 22 mEq/L (ref 22–29)
Calcium: 9.8 mg/dL (ref 8.4–10.4)
Chloride: 107 mEq/L (ref 98–109)
Creatinine: 1.1 mg/dL (ref 0.6–1.1)
EGFR: 46 mL/min/{1.73_m2} — ABNORMAL LOW (ref >90)
GLUCOSE: 112 mg/dL (ref 70–140)
POTASSIUM: 4.5 meq/L (ref 3.5–5.1)
SODIUM: 138 meq/L (ref 136–145)
Total Bilirubin: 0.45 mg/dL (ref 0.20–1.20)
Total Protein: 6.7 g/dL (ref 6.4–8.3)

## 2016-02-23 NOTE — Telephone Encounter (Signed)
Appointments scheduled per 1/16 LOS. Patient given AVS report and calendars with future scheduled appointments. °

## 2016-02-24 LAB — FERRITIN: Ferritin: 156 ng/ml (ref 9–269)

## 2016-02-24 LAB — IRON AND TIBC
%SAT: 26 % (ref 21–57)
IRON: 73 ug/dL (ref 41–142)
TIBC: 278 ug/dL (ref 236–444)
UIBC: 205 ug/dL (ref 120–384)
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
## 2016-03-01 ENCOUNTER — Encounter: Payer: Self-pay | Admitting: Family Medicine

## 2016-03-01 ENCOUNTER — Ambulatory Visit (INDEPENDENT_AMBULATORY_CARE_PROVIDER_SITE_OTHER): Payer: Medicare HMO | Admitting: Family Medicine

## 2016-03-01 VITALS — BP 122/52 | HR 67 | Temp 98.7°F | Resp 16 | Ht 64.0 in | Wt 214.2 lb

## 2016-03-01 DIAGNOSIS — I872 Venous insufficiency (chronic) (peripheral): Secondary | ICD-10-CM

## 2016-03-01 DIAGNOSIS — M7989 Other specified soft tissue disorders: Secondary | ICD-10-CM

## 2016-03-01 DIAGNOSIS — I10 Essential (primary) hypertension: Secondary | ICD-10-CM | POA: Diagnosis not present
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
## 2016-03-01 NOTE — Patient Instructions (Addendum)
Increase Spironolactone 25mg  one tablet TWICE daily until swelling has improved.  HOLD Cozaar while taking two Spironolactone.    IF you received an x-ray today, you will receive an invoice from [facility]. Please contact [facility] at [phone] with questions or concerns regarding your invoice.   IF you received labwork today, you will receive an invoice from [facility]. Please contact [facility] at [phone] with questions or concerns regarding your invoice.   Our billing staff will not be able to assist you with questions regarding bills from these companies.  You will be contacted with the lab results as soon as they are available. The fastest way to get your results is to activate your My Chart account. Instructions are located on the last page of this paperwork. If you have not heard from [place] regarding the results in 2 weeks, please contact this office.

## 2016-03-02 LAB — COMPREHENSIVE METABOLIC PANEL
ALT: 21 IU/L (ref 0–32)
AST: 22 IU/L (ref 0–40)
Albumin/Globulin Ratio: 2.5 — ABNORMAL HIGH (ref 1.2–2.2)
Albumin: 4.8 g/dL — ABNORMAL HIGH (ref 3.5–4.7)
Alkaline Phosphatase: 104 IU/L (ref 39–117)
BUN/Creatinine Ratio: 33 — ABNORMAL HIGH (ref 12–28)
BUN: 37 mg/dL — AB (ref 8–27)
Bilirubin Total: 0.3 mg/dL (ref 0.0–1.2)
CALCIUM: 10 mg/dL (ref 8.7–10.3)
CO2: 24 mmol/L (ref 18–29)
Chloride: 103 mmol/L (ref 96–106)
Creatinine, Ser: 1.12 mg/dL — ABNORMAL HIGH (ref 0.57–1.00)
GFR calc Af Amer: 53 mL/min/{1.73_m2} — ABNORMAL LOW (ref >59)
GFR, EST NON AFRICAN AMERICAN: 46 mL/min/{1.73_m2} — AB (ref >59)
GLOBULIN, TOTAL: 1.9 g/dL (ref 1.5–4.5)
Glucose: 92 mg/dL (ref 65–99)
Potassium: 5 mmol/L (ref 3.5–5.2)
Sodium: 141 mmol/L (ref 134–144)
Total Protein: 6.7 g/dL (ref 6.0–8.5)

## 2016-03-02 LAB — CBC WITH DIFFERENTIAL/PLATELET
Basophils Absolute: 0 10*3/uL (ref 0.0–0.2)
Basos: 0 %
EOS (ABSOLUTE): 0.2 10*3/uL (ref 0.0–0.4)
EOS: 3 %
HEMATOCRIT: 31 % — AB (ref 34.0–46.6)
Hemoglobin: 10.3 g/dL — ABNORMAL LOW (ref 11.1–15.9)
IMMATURE GRANULOCYTES: 0 %
Immature Grans (Abs): 0 10*3/uL (ref 0.0–0.1)
Lymphocytes Absolute: 0.9 10*3/uL (ref 0.7–3.1)
Lymphs: 18 %
MCH: 31.2 pg (ref 26.6–33.0)
MCHC: 33.2 g/dL (ref 31.5–35.7)
MCV: 94 fL (ref 79–97)
MONOCYTES: 10 %
MONOS ABS: 0.5 10*3/uL (ref 0.1–0.9)
NEUTROS PCT: 69 %
Neutrophils Absolute: 3.6 10*3/uL (ref 1.4–7.0)
Platelets: 235 10*3/uL (ref 150–379)
RBC: 3.3 x10E6/uL — ABNORMAL LOW (ref 3.77–5.28)
RDW: 15.6 % — AB (ref 12.3–15.4)
WBC: 5.2 10*3/uL (ref 3.4–10.8)

## 2016-03-03 ENCOUNTER — Ambulatory Visit (INDEPENDENT_AMBULATORY_CARE_PROVIDER_SITE_OTHER): Payer: Medicare HMO | Admitting: Physician Assistant

## 2016-03-03 ENCOUNTER — Encounter: Payer: Self-pay | Admitting: Physician Assistant

## 2016-03-03 VITALS — BP 144/58 | HR 66 | Temp 98.5°F | Ht 64.0 in | Wt 214.0 lb

## 2016-03-03 DIAGNOSIS — L0390 Cellulitis, unspecified: Secondary | ICD-10-CM

## 2016-03-03 DIAGNOSIS — I890 Lymphedema, not elsewhere classified: Secondary | ICD-10-CM | POA: Diagnosis not present

## 2016-03-03 MED ORDER — CEPHALEXIN 500 MG PO CAPS: 500.0000 mg | ORAL_CAPSULE | Freq: Three times a day (TID) | ORAL | 0 refills | Status: DC

## 2016-03-03 NOTE — Progress Notes (Signed)
Chelsea Browning  MRN: [id number] DOB: 07-23-1932  Subjective:  Pt presents to clinic with continued swelling of her legs but now her right leg is more painful and it is red and warm to the touch.  She does not have fevers and does not feel bad.  She uses her nightly air compression on her legs for 30 mins and tries to elevate her feet while sitting.  She has noticed that her right ankle is weeping.  Review of Systems  Constitutional: Negative for chills and fever.  Skin: Positive for rash and wound.    Patient Active Problem List   Diagnosis Date Noted  . Hypogammaglobulinemia ([place]) 08/20/2015  . H/O skin graft 06/23/2015  . H/O Clostridium difficile infection 10/07/2014  . Right hip pain 03/24/2014  . Obesity 07/24/2013  . Hiatal hernia with gastroesophageal reflux 07/24/2013  . HTN (hypertension) 07/24/2013  . Pedal edema 07/24/2013  . Carotid artery disease ([place]) 07/24/2013  . Arthritis   . Depression with anxiety   . HLD (hyperlipidemia)   . Hyponatremia   . Anemia 01/30/2013  . Stasis dermatitis of both legs 01/21/2013    Current Outpatient Prescriptions on File Prior to Visit  Medication Sig Dispense Refill  . acetaminophen (TYLENOL) 500 MG tablet Take 1,000 mg by mouth every 6 (six) hours as needed for moderate pain.    Marland Kitchen amLODipine (NORVASC) 5 MG tablet Take 5 mg by mouth daily.    Marland Kitchen atorvastatin (LIPITOR) 40 MG tablet TAKE 1 TABLET DAILY. 90 tablet 0  . dipyridamole (PERSANTINE) 75 MG tablet Take 1 tablet (75 mg total) by mouth 2 (two) times daily. 60 tablet 12  . HYDROcodone-acetaminophen (NORCO) 5-325 MG tablet Take 1 tablet by mouth every 6 (six) hours as needed. 30 tablet 0  . labetalol (NORMODYNE) 200 MG tablet Take 1 tablet (200 mg total) by mouth 3 (three) times daily. 270 tablet 1  . losartan (COZAAR) 50 MG tablet Take 1 tablet (50 mg total) by mouth daily. 90 tablet 2  . mupirocin ointment (BACTROBAN) 2 % Apply 1 application topically 2 (two) times daily. 90  g 0  . omeprazole (PRILOSEC) 20 MG capsule TAKE 1 CAPSULE ONCE DAILY BEFORE BREAKFAST. 90 capsule 0  . spironolactone (ALDACTONE) 25 MG tablet Take 25 mg by mouth daily.    . Triamcinolone Acetonide (TRIAMCINOLONE 0.1 % CREAM : EUCERIN) CREA Use once or twice daily on the legs 1 each 3  . lidocaine (XYLOCAINE) 2 % jelly Apply 1 application topically as needed. (Patient not taking: Reported on 03/03/2016) 30 mL 0   No current facility-administered medications on file prior to visit.     Allergies  Allergen Reactions  . Augmentin [Amoxicillin-Pot Clavulanate] Hives  . Doxycycline Rash    Pt patients past, family and social history were reviewed and updated.   Objective:  BP (!) 144/58 (BP Location: Right Arm, Cuff Size: Normal)   Pulse 66   Temp 98.5 F (36.9 C) (Oral)   Ht 5\' 4"  (1.626 m)   Wt 214 lb (97.1 kg)   SpO2 96%   BMI 36.73 kg/m   Physical Exam  Constitutional: She is oriented to person, place, and time and well-developed, well-nourished, and in no distress.  HENT:  Head: Normocephalic and atraumatic.  Right Ear: Hearing and external ear normal.  Left Ear: Hearing and external ear normal.  Eyes: Conjunctivae are normal.  Neck: Normal range of motion.  Pulmonary/Chest: Effort normal.  Neurological: She is alert and oriented to  person, place, and time. Gait normal.  Skin: Skin is warm and dry.  Bilateral edema R>L extending from just distal to the knee through the ankle.  There is erythema on the medial aspect of the calf with TTP.  Minimum of 2+ pitting edema bilateral LE.  Right ankle is weeping clear fluids.  Few small open sores on both shins and medial calves.  Psychiatric: Mood, memory, affect and judgment normal.  Vitals reviewed.  Bilateral unna boots placed on LE. Assessment and Plan :  Lymph edema  Cellulitis, unspecified cellulitis site - Plan: cephALEXin (KEFLEX) 500 MG capsule - pt will start Keflex - that has worked in the past and the patient  tolerates it well.  She will recheck in 5 days for unna boot removal and cellulitis check.  If she develops fever or not feeling well she will RTC sooner.  She will work on elevating the legs and continue the air compression on the left leg.  For now she will stop on the right due to active infection.  Windell Hummingbird PA-C  [facility] 03/03/2016 7:30 PM

## 2016-03-03 NOTE — Patient Instructions (Signed)
     IF you received an x-ray today, you will receive an invoice from [facility]. Please contact [facility] at [phone] with questions or concerns regarding your invoice.   IF you received labwork today, you will receive an invoice from [facility]. Please contact [facility] at [phone] with questions or concerns regarding your invoice.   Our billing staff will not be able to assist you with questions regarding bills from these companies.  You will be contacted with the lab results as soon as they are available. The fastest way to get your results is to activate your My Chart account. Instructions are located on the last page of this paperwork. If you have not heard from us regarding the results in 2 weeks, please contact this office.     

## 2016-03-08 ENCOUNTER — Ambulatory Visit: Payer: Medicare HMO | Admitting: Family Medicine

## 2016-03-09 ENCOUNTER — Encounter: Payer: Self-pay | Admitting: Family Medicine

## 2016-03-09 ENCOUNTER — Ambulatory Visit (INDEPENDENT_AMBULATORY_CARE_PROVIDER_SITE_OTHER): Payer: Medicare HMO | Admitting: Family Medicine

## 2016-03-09 VITALS — BP 141/66 | HR 67 | Temp 97.9°F | Resp 16 | Ht 64.0 in | Wt 214.0 lb

## 2016-03-09 DIAGNOSIS — I872 Venous insufficiency (chronic) (peripheral): Secondary | ICD-10-CM

## 2016-03-09 DIAGNOSIS — L03115 Cellulitis of right lower limb: Secondary | ICD-10-CM
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
## 2016-03-09 NOTE — Patient Instructions (Signed)
     IF you received an x-ray today, you will receive an invoice from [facility]. Please contact [facility] at [phone] with questions or concerns regarding your invoice.   IF you received labwork today, you will receive an invoice from [facility]. Please contact [facility] at [phone] with questions or concerns regarding your invoice.   Our billing staff will not be able to assist you with questions regarding bills from these companies.  You will be contacted with the lab results as soon as they are available. The fastest way to get your results is to activate your My Chart account. Instructions are located on the last page of this paperwork. If you have not heard from us regarding the results in 2 weeks, please contact this office.     

## 2016-03-16 ENCOUNTER — Encounter: Payer: Self-pay | Admitting: Family Medicine

## 2016-03-16 ENCOUNTER — Ambulatory Visit (INDEPENDENT_AMBULATORY_CARE_PROVIDER_SITE_OTHER): Payer: Medicare HMO | Admitting: Family Medicine

## 2016-03-16 VITALS — BP 142/61 | HR 68 | Temp 98.0°F | Resp 18 | Ht 64.0 in

## 2016-03-16 DIAGNOSIS — L03116 Cellulitis of left lower limb: Secondary | ICD-10-CM | POA: Diagnosis not present

## 2016-03-16 DIAGNOSIS — I872 Venous insufficiency (chronic) (peripheral): Secondary | ICD-10-CM

## 2016-03-16 DIAGNOSIS — L03115 Cellulitis of right lower limb: Secondary | ICD-10-CM | POA: Diagnosis not present
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
## 2016-03-16 NOTE — Patient Instructions (Signed)
     IF you received an x-ray today, you will receive an invoice from [facility]. Please contact  Radiology at [phone] with questions or concerns regarding your invoice.   IF you received labwork today, you will receive an invoice from [facility]. Please contact [facility] at [phone] with questions or concerns regarding your invoice.   Our billing staff will not be able to assist you with questions regarding bills from these companies.  You will be contacted with the lab results as soon as they are available. The fastest way to get your results is to activate your My Chart account. Instructions are located on the last page of this paperwork. If you have not heard from us regarding the results in 2 weeks, please contact this office.     

## 2016-03-23 ENCOUNTER — Ambulatory Visit (INDEPENDENT_AMBULATORY_CARE_PROVIDER_SITE_OTHER): Payer: Medicare HMO | Admitting: Family Medicine

## 2016-03-23 VITALS — BP 136/59 | HR 64 | Temp 97.9°F | Resp 16 | Ht 64.0 in | Wt 221.0 lb

## 2016-03-23 DIAGNOSIS — K219 Gastro-esophageal reflux disease without esophagitis: Secondary | ICD-10-CM

## 2016-03-23 DIAGNOSIS — K449 Diaphragmatic hernia without obstruction or gangrene: Secondary | ICD-10-CM | POA: Diagnosis not present

## 2016-03-23 DIAGNOSIS — L03119 Cellulitis of unspecified part of limb: Secondary | ICD-10-CM | POA: Diagnosis not present

## 2016-03-23 DIAGNOSIS — L02419 Cutaneous abscess of limb, unspecified: Secondary | ICD-10-CM | POA: Diagnosis not present

## 2016-03-23 DIAGNOSIS — I872 Venous insufficiency (chronic) (peripheral): Secondary | ICD-10-CM

## 2016-03-23 MED ORDER — CEFTRIAXONE SODIUM 1 G IJ SOLR
1.0000 g | Freq: Once | INTRAMUSCULAR | Status: AC
  Administered 2016-03-23: 1 g via INTRAMUSCULAR

## 2016-03-23 MED ORDER — CLOTRIMAZOLE-BETAMETHASONE 1-0.05 % EX CREA: 1.0000 "application " | TOPICAL_CREAM | Freq: Two times a day (BID) | CUTANEOUS | 0 refills | Status: DC

## 2016-03-23 MED ORDER — OMEPRAZOLE 20 MG PO CPDR: DELAYED_RELEASE_CAPSULE | ORAL | 3 refills | Status: DC
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
## 2016-03-23 NOTE — Patient Instructions (Signed)
     IF you received an x-ray today, you will receive an invoice from [facility]. Please contact [facility] at [phone] with questions or concerns regarding your invoice.   IF you received labwork today, you will receive an invoice from [facility]. Please contact [facility] at [phone] with questions or concerns regarding your invoice.   Our billing staff will not be able to assist you with questions regarding bills from these companies.  You will be contacted with the lab results as soon as they are available. The fastest way to get your results is to activate your My Chart account. Instructions are located on the last page of this paperwork. If you have not heard from us regarding the results in 2 weeks, please contact this office.     

## 2016-03-28 DIAGNOSIS — M1611 Unilateral primary osteoarthritis, right hip: Secondary | ICD-10-CM | POA: Diagnosis not present

## 2016-03-28 DIAGNOSIS — M25551 Pain in right hip: Secondary | ICD-10-CM | POA: Diagnosis not present

## 2016-03-29 ENCOUNTER — Encounter: Payer: Self-pay | Admitting: Physician Assistant

## 2016-03-29 ENCOUNTER — Ambulatory Visit (INDEPENDENT_AMBULATORY_CARE_PROVIDER_SITE_OTHER): Payer: Medicare HMO | Admitting: Physician Assistant

## 2016-03-29 VITALS — BP 134/57 | HR 67 | Temp 98.2°F | Ht 64.0 in | Wt 213.0 lb

## 2016-03-29 DIAGNOSIS — I872 Venous insufficiency (chronic) (peripheral): Secondary | ICD-10-CM | POA: Diagnosis not present

## 2016-03-29 DIAGNOSIS — I890 Lymphedema, not elsewhere classified: Secondary | ICD-10-CM

## 2016-03-29 DIAGNOSIS — L0390 Cellulitis, unspecified: Secondary | ICD-10-CM | POA: Diagnosis not present

## 2016-03-29 MED ORDER — CEPHALEXIN 500 MG PO CAPS: 500.0000 mg | ORAL_CAPSULE | Freq: Three times a day (TID) | ORAL | 0 refills | Status: DC

## 2016-03-29 NOTE — Patient Instructions (Addendum)
  Start Keflex to help with the early or resolving infection. Take 1 pills 3x/day    IF you received an x-ray today, you will receive an invoice from [facility]. Please contact [facility] at [phone] with questions or concerns regarding your invoice.   IF you received labwork today, you will receive an invoice from [facility]. Please contact [facility] at [phone] with questions or concerns regarding your invoice.   Our billing staff will not be able to assist you with questions regarding bills from these companies.  You will be contacted with the lab results as soon as they are available. The fastest way to get your results is to activate your My Chart account. Instructions are located on the last page of this paperwork. If you have not heard from [place] regarding the results in 2 weeks, please contact this office.

## 2016-03-29 NOTE — Progress Notes (Signed)
Chelsea Browning  MRN: [id number] DOB: 18-Jun-1932  Subjective:  Pt presents to clinic for unna boot removal.  It was place last week and she feels like it is ready to come off.  She was worked in because she thought she had an appt today.  She has had no fevers.  She uses pneumatic lower leg compression 2x/day - ptr states she sits with her legs up most of the day but her daughter states that she sits in a chair that could elevated her legs but she actually sits with her legs down most of the day.  Surgery is scheduled in 2 weeks for a hip nerve block.  Review of Systems  Constitutional: Negative for chills and fever.    Patient Active Problem List   Diagnosis Date Noted  . Hypogammaglobulinemia ([place]) 08/20/2015  . H/O skin graft 06/23/2015  . H/O Clostridium difficile infection 10/07/2014  . Right hip pain 03/24/2014  . Obesity 07/24/2013  . Hiatal hernia with gastroesophageal reflux 07/24/2013  . HTN (hypertension) 07/24/2013  . Pedal edema 07/24/2013  . Carotid artery disease ([place]) 07/24/2013  . Arthritis   . Depression with anxiety   . HLD (hyperlipidemia)   . Hyponatremia   . Anemia 01/30/2013  . Stasis dermatitis of both legs 01/21/2013    Current Outpatient Prescriptions on File Prior to Visit  Medication Sig Dispense Refill  . acetaminophen (TYLENOL) 500 MG tablet Take 1,000 mg by mouth every 6 (six) hours as needed for moderate pain.    Marland Kitchen amLODipine (NORVASC) 5 MG tablet Take 5 mg by mouth daily.    Marland Kitchen atorvastatin (LIPITOR) 40 MG tablet TAKE 1 TABLET DAILY. 90 tablet 0  . clotrimazole-betamethasone (LOTRISONE) cream Apply 1 application topically 2 (two) times daily. 30 g 0  . dipyridamole (PERSANTINE) 75 MG tablet Take 1 tablet (75 mg total) by mouth 2 (two) times daily. 60 tablet 12  . HYDROcodone-acetaminophen (NORCO) 5-325 MG tablet Take 1 tablet by mouth every 6 (six) hours as needed. 30 tablet 0  . labetalol (NORMODYNE) 200 MG tablet Take 1 tablet (200 mg total)  by mouth 3 (three) times daily. 270 tablet 1  . lidocaine (XYLOCAINE) 2 % jelly Apply 1 application topically as needed. 30 mL 0  . losartan (COZAAR) 50 MG tablet Take 1 tablet (50 mg total) by mouth daily. 90 tablet 2  . mupirocin ointment (BACTROBAN) 2 % Apply 1 application topically 2 (two) times daily. 90 g 0  . omeprazole (PRILOSEC) 20 MG capsule TAKE 1 CAPSULE ONCE DAILY BEFORE BREAKFAST. 90 capsule 3  . spironolactone (ALDACTONE) 25 MG tablet Take 25 mg by mouth daily.    . Triamcinolone Acetonide (TRIAMCINOLONE 0.1 % CREAM : EUCERIN) CREA Use once or twice daily on the legs 1 each 3   No current facility-administered medications on file prior to visit.     Allergies  Allergen Reactions  . Augmentin [Amoxicillin-Pot Clavulanate] Hives  . Doxycycline Rash    Pt patients past, family and social history were reviewed and updated.   Objective:  BP (!) 134/57 (BP Location: Right Arm, Patient Position: Sitting, Cuff Size: Large)   Pulse 67   Temp 98.2 F (36.8 C) (Oral)   Ht 5\' 4"  (1.626 m)   Wt 213 lb (96.6 kg)   SpO2 96%   BMI 36.56 kg/m   Physical Exam  Constitutional: She is oriented to person, place, and time and well-developed, well-nourished, and in no distress.  HENT:  Head: Normocephalic and  atraumatic.  Right Ear: Hearing and external ear normal.  Left Ear: Hearing and external ear normal.  Eyes: Conjunctivae are normal.  Neck: Normal range of motion.  Pulmonary/Chest: Effort normal.  Neurological: She is alert and oriented to person, place, and time. Gait normal.  Skin: Skin is warm and dry.  Unna boot removed from her left lower leg - the skin is intact there is some minimal erythema and warmth on the lateral mid part of the left lower leg - the right leg is much more swollen that the left once the unna boot is removed  Psychiatric: Mood, memory, affect and judgment normal.  Vitals reviewed.   Assessment and Plan :  Lymph edema  Cellulitis, unspecified  cellulitis site  Stasis dermatitis of both legs   Unna boot removed at patient's request - started on keflex due to mild erythema an warmth - emphasized to patient the importance of elevated as compression really does make a difference in her edema as demonstrated but the different in leg size after the unna boot was removed.  She will keep her appt next week for f/u.  Windell Hummingbird PA-C  Primary Care at [facility] 03/30/2016 9:46 PM

## 2016-04-05 ENCOUNTER — Ambulatory Visit (INDEPENDENT_AMBULATORY_CARE_PROVIDER_SITE_OTHER): Payer: Medicare HMO | Admitting: Family Medicine

## 2016-04-05 ENCOUNTER — Encounter: Payer: Self-pay | Admitting: Family Medicine

## 2016-04-05 VITALS — BP 138/58 | HR 67 | Temp 99.0°F | Resp 16 | Wt 219.8 lb

## 2016-04-05 DIAGNOSIS — I10 Essential (primary) hypertension: Secondary | ICD-10-CM

## 2016-04-05 DIAGNOSIS — I872 Venous insufficiency (chronic) (peripheral): Secondary | ICD-10-CM

## 2016-04-05 DIAGNOSIS — Z1231 Encounter for screening mammogram for malignant neoplasm of breast: Secondary | ICD-10-CM

## 2016-04-05 DIAGNOSIS — L03116 Cellulitis of left lower limb: Secondary | ICD-10-CM | POA: Diagnosis not present

## 2016-04-05 NOTE — Patient Instructions (Signed)
     IF you received an x-ray today, you will receive an invoice from [facility]. Please contact [facility] at [phone] with questions or concerns regarding your invoice.   IF you received labwork today, you will receive an invoice from [facility]. Please contact [facility] at [phone] with questions or concerns regarding your invoice.   Our billing staff will not be able to assist you with questions regarding bills from these companies.  You will be contacted with the lab results as soon as they are available. The fastest way to get your results is to activate your My Chart account. Instructions are located on the last page of this paperwork. If you have not heard from us regarding the results in 2 weeks, please contact this office.     
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
## 2016-04-06 LAB — COMPREHENSIVE METABOLIC PANEL
ALT: 19 IU/L (ref 0–32)
AST: 20 IU/L (ref 0–40)
Albumin/Globulin Ratio: 2.3 — ABNORMAL HIGH (ref 1.2–2.2)
Albumin: 4.5 g/dL (ref 3.5–4.7)
Alkaline Phosphatase: 111 IU/L (ref 39–117)
BUN/Creatinine Ratio: 31 — ABNORMAL HIGH (ref 12–28)
BUN: 27 mg/dL (ref 8–27)
Bilirubin Total: 0.4 mg/dL (ref 0.0–1.2)
CALCIUM: 9.4 mg/dL (ref 8.7–10.3)
CHLORIDE: 99 mmol/L (ref 96–106)
CO2: 21 mmol/L (ref 18–29)
Creatinine, Ser: 0.86 mg/dL (ref 0.57–1.00)
GFR, EST AFRICAN AMERICAN: 73 mL/min/{1.73_m2} (ref >59)
GFR, EST NON AFRICAN AMERICAN: 64 mL/min/{1.73_m2} (ref >59)
GLUCOSE: 100 mg/dL — AB (ref 65–99)
Globulin, Total: 2 g/dL (ref 1.5–4.5)
Potassium: 5 mmol/L (ref 3.5–5.2)
Sodium: 136 mmol/L (ref 134–144)
TOTAL PROTEIN: 6.5 g/dL (ref 6.0–8.5)

## 2016-04-07 ENCOUNTER — Telehealth: Payer: Self-pay

## 2016-04-07 NOTE — Telephone Encounter (Signed)
PA was achieved 04/07/2016-02/06/2017. Patient aware.

## 2016-04-07 NOTE — Telephone Encounter (Signed)
Pa needed for dipyridamole [phone] Id OR:[id number]

## 2016-04-08 ENCOUNTER — Encounter: Payer: Self-pay | Admitting: Family Medicine

## 2016-04-11 ENCOUNTER — Other Ambulatory Visit: Payer: Self-pay | Admitting: Family Medicine

## 2016-04-11 DIAGNOSIS — M25551 Pain in right hip: Secondary | ICD-10-CM | POA: Diagnosis not present

## 2016-04-11 DIAGNOSIS — M1611 Unilateral primary osteoarthritis, right hip: Secondary | ICD-10-CM | POA: Diagnosis not present

## 2016-04-15 ENCOUNTER — Encounter: Payer: Self-pay | Admitting: Family Medicine

## 2016-04-28 DIAGNOSIS — M1611 Unilateral primary osteoarthritis, right hip: Secondary | ICD-10-CM | POA: Diagnosis not present

## 2016-04-28 DIAGNOSIS — M25551 Pain in right hip: Secondary | ICD-10-CM | POA: Diagnosis not present

## 2016-05-04 DIAGNOSIS — M25551 Pain in right hip: Secondary | ICD-10-CM | POA: Diagnosis not present

## 2016-05-13 DIAGNOSIS — E785 Hyperlipidemia, unspecified: Secondary | ICD-10-CM | POA: Diagnosis not present

## 2016-05-13 DIAGNOSIS — E669 Obesity, unspecified: Secondary | ICD-10-CM | POA: Diagnosis not present

## 2016-05-13 DIAGNOSIS — M130 Polyarthritis, unspecified: Secondary | ICD-10-CM | POA: Diagnosis not present

## 2016-05-13 DIAGNOSIS — Z87828 Personal history of other (healed) physical injury and trauma: Secondary | ICD-10-CM | POA: Diagnosis not present

## 2016-05-13 DIAGNOSIS — Z0000 Encounter for general adult medical examination without abnormal findings: Secondary | ICD-10-CM | POA: Diagnosis not present

## 2016-05-13 DIAGNOSIS — K219 Gastro-esophageal reflux disease without esophagitis: Secondary | ICD-10-CM | POA: Diagnosis not present

## 2016-05-13 DIAGNOSIS — L819 Disorder of pigmentation, unspecified: Secondary | ICD-10-CM | POA: Diagnosis not present

## 2016-05-13 DIAGNOSIS — I10 Essential (primary) hypertension: Secondary | ICD-10-CM | POA: Diagnosis not present

## 2016-05-13 DIAGNOSIS — R600 Localized edema: Secondary | ICD-10-CM | POA: Diagnosis not present

## 2016-05-13 DIAGNOSIS — Z6839 Body mass index (BMI) 39.0-39.9, adult: Secondary | ICD-10-CM | POA: Diagnosis not present

## 2016-05-13 DIAGNOSIS — R292 Abnormal reflex: Secondary | ICD-10-CM | POA: Diagnosis not present

## 2016-05-23 ENCOUNTER — Telehealth: Payer: Self-pay | Admitting: Family Medicine

## 2016-05-23 NOTE — Telephone Encounter (Signed)
PATIENT WOULD LIKE THIS MESSAGE TO GO TO DR. Tamala Julian:  SHE DROPPED OFF HER SCAT FORM PAPER WORK TO DR. Tamala Julian AT [address] ON  Thursday 05/19/16. THE GIRL AT THE FRONT DESK SAID SHE WOULD PUT THEM IN DR. SMITH'S BOX. SHE WOULD LIKE TO KNOW IF DR. Tamala Julian GOT THE FORMS AND IF THEY HAVE BEEN COMPLETED YET? SHE HAS TO HAVE THEM RE-FILLED OUT EVERY 3 YEARS. HER DEAD LINE IS 06/05/16 OR SHE CAN NOT RIDE THE SCAT BUS AGAIN. BEST PHONE [phone] (CELL) [place]

## 2016-05-24 IMAGING — MR MR LUMBAR SPINE W/O CM
4 of 5 series · 21 of 48 positions shown · non-contrast
Comparison: None.

CLINICAL DATA: 82-year-old female with low back pain, right hip and
leg pain for the past year worsening. Initial encounter.

EXAM:
MRI LUMBAR SPINE WITHOUT CONTRAST
TECHNIQUE: Multiplanar, multisequence MR imaging of the lumbar spine was
performed. No intravenous contrast was administered.

[Series 2: T2 · sagittal · 4.0mm · 0.44mm/px · 6 of 12 slices shown (1 of 2)]
[im 1/12]
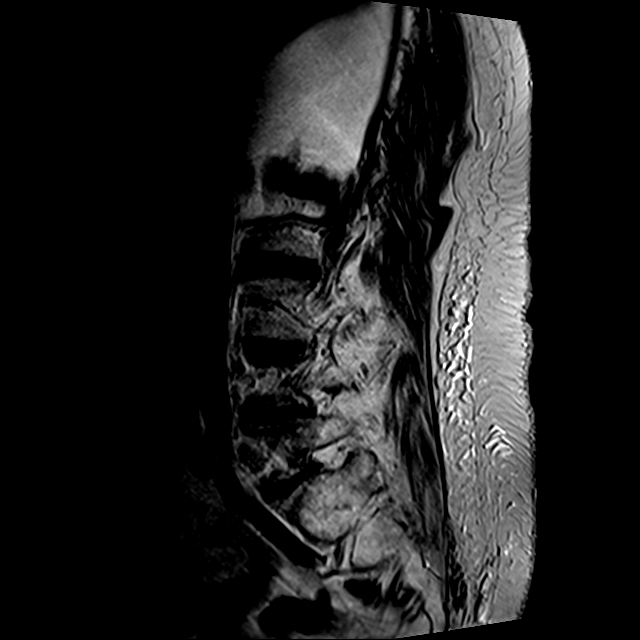
[im 3/12]
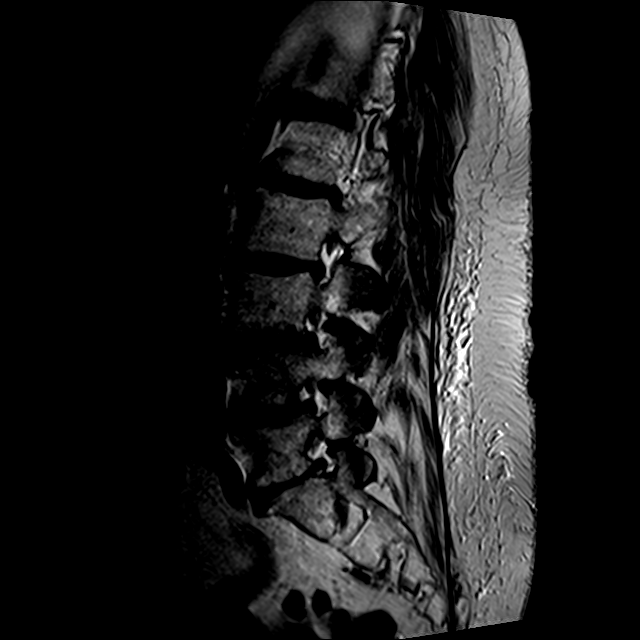
[im 5/12]
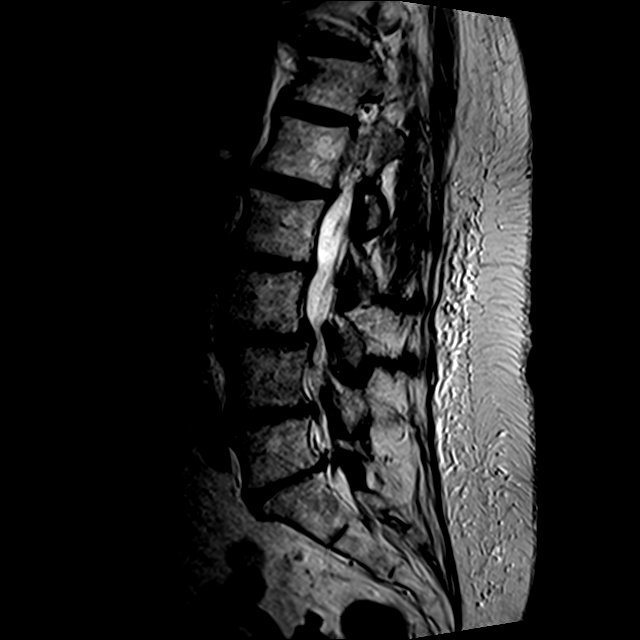
[im 7/12]
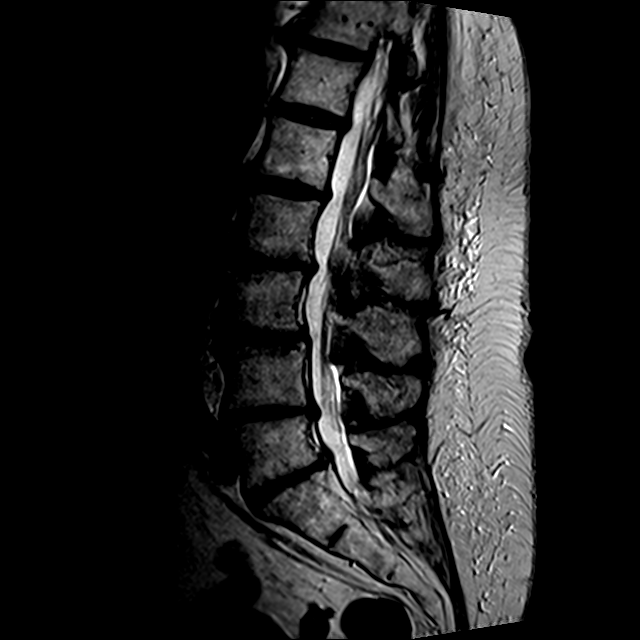
[im 9/12]
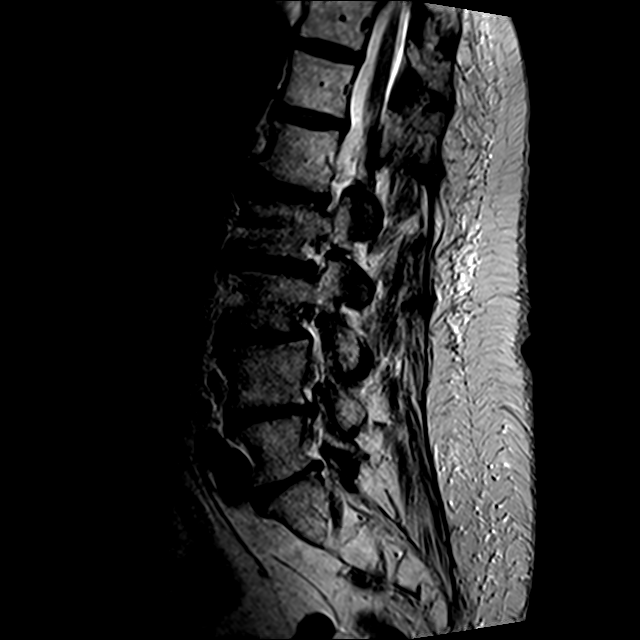
[im 12/12]
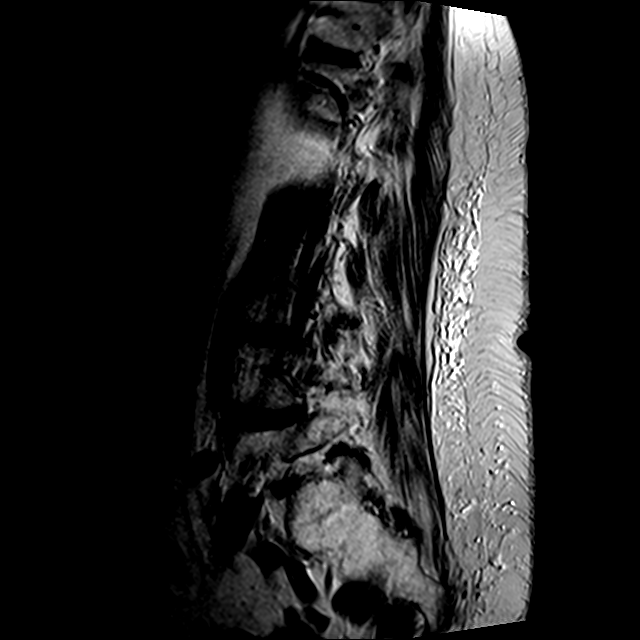

[Series 3: T1 · sagittal · 4.0mm · 0.55mm/px · 3 of 12 slices shown (1 of 2)]
[im 3/12]
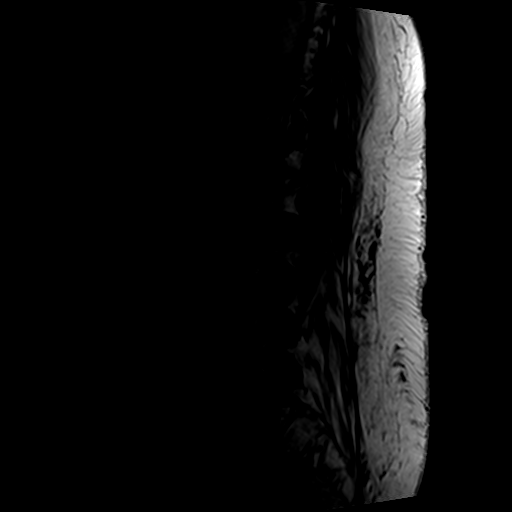
[im 7/12]
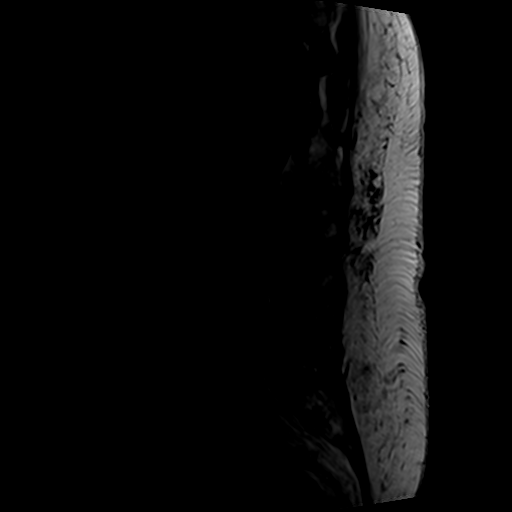
[im 12/12]
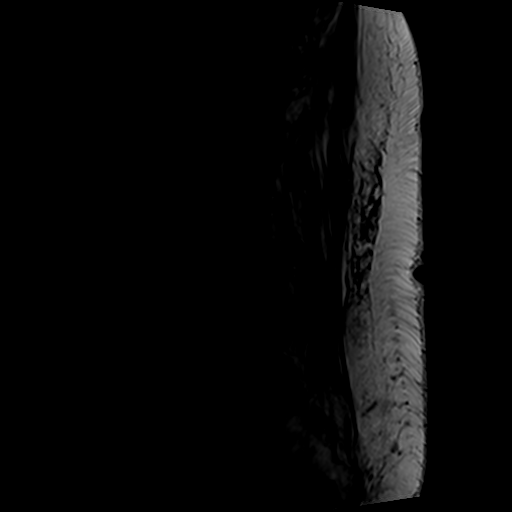

[Series 7: T1 · axial · 4.0mm · 0.37mm/px · z∈[-46,+84]mm · 3 of 30 slices shown (2 of 2)]
[im 5/30]
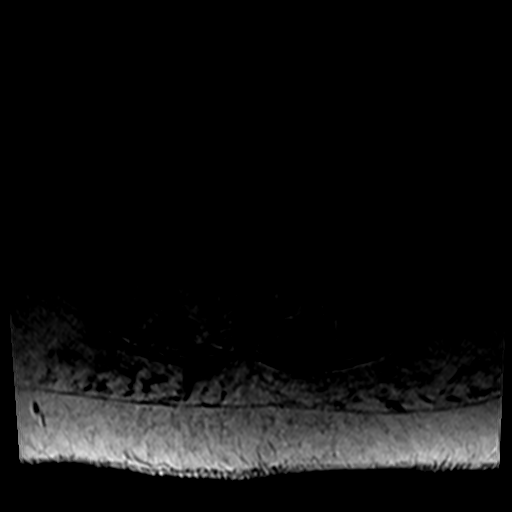
[im 15/30]
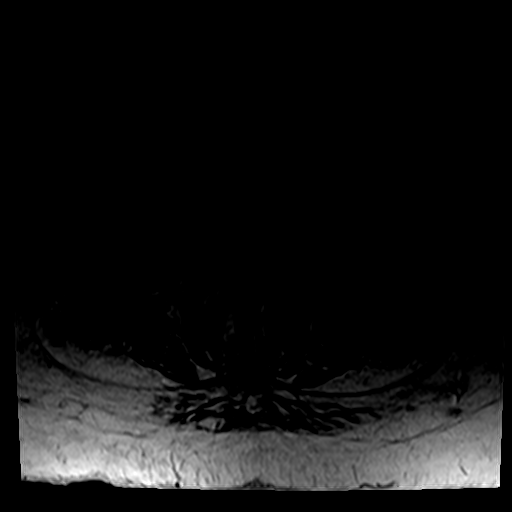
[im 25/30]
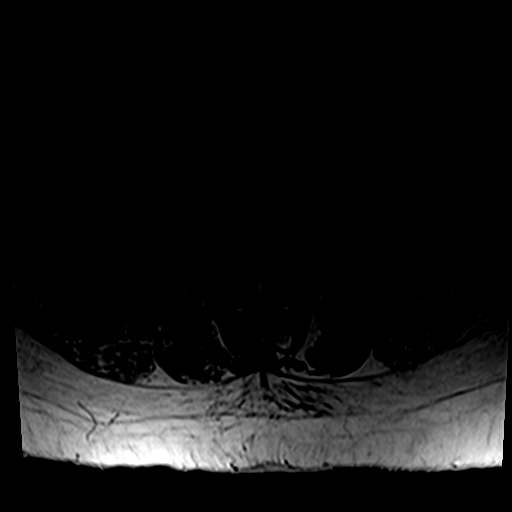

[Series 9: T2 · axial · 4.0mm · 0.74mm/px · z∈[-67,+164]mm · 9 of 30 slices shown (2 of 2)]
[im 1/30]
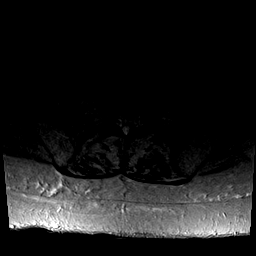
[im 5/30]
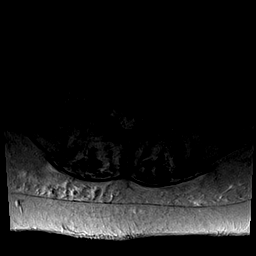
[im 9/30]
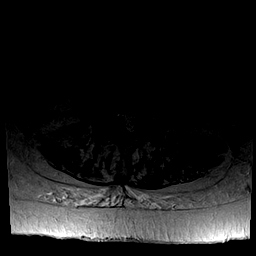
[im 13/30]
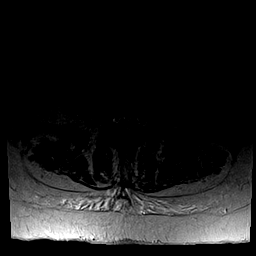
[im 15/30]
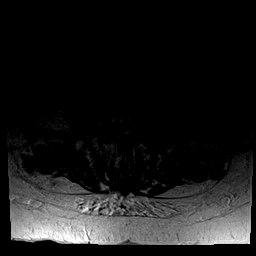
[im 17/30]
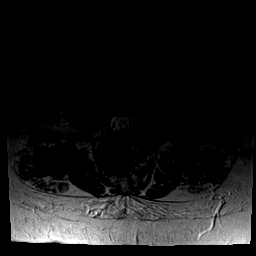
[im 21/30]
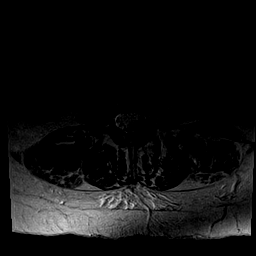
[im 25/30]
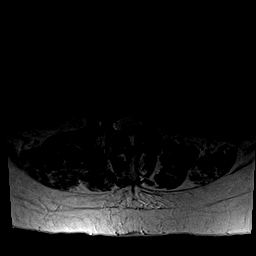
[im 30/30]
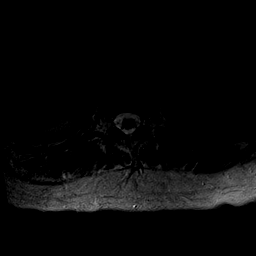

[21 of 48 positions shown; findings below may reference images not displayed]

FINDINGS: Patient moved during exam. Patient had to be rescouted. Axial images
are based on sagittal series 6.

Last fully open disk space is labeled L5-S1. Present examination
incorporates from T11-12 through lower sacrum.

Conus L1 level.

Mild ectasia common iliac arteries otherwise visualized
paravertebral structures unremarkable. Scattered normal size
retroperitoneal lymph nodes.

Mild scoliosis.

T11-12: Negative.

T12-L1:  Minimal bulge.  Minimal facet joint degenerative changes.

L1-2: Facet joint degenerative changes greater on the left. Mild
bulge and spur.

L2-3: Disc degeneration, disc space narrowing and bulge/ spur
greater left lateral position. Encroachment upon but not compression
of the exiting left L2 nerve root. Facet joint degenerative changes
and ligamentum flavum hypertrophy greater on left with slight
impression left posterior lateral aspect of the thecal sac.

L3-4: Moderate to marked facet joint degenerative changes with bony
overgrowth. 2 mm anterior slip of L3. Bulge with slight cephalad
extension. Dorsal epidural fat. Multifactorial moderate spinal
stenosis. Foraminal/lateral extension of disc/osteophyte slightly
greater to left approaching but not compressing the exiting L3 nerve
roots.

L4-5: Disc space narrowing endplate reactive changes greater on the
right. Lateral extension of disc osteophyte slightly greater to the
right without compression of the exiting L4 nerve roots. Facet joint
degenerative changes and ligamentum flavum hypertrophy greater on
left. Multifactorial marked left-sided and moderate to marked
right-sided lateral recess stenosis and spinal stenosis.

L5-S1: Broad-based disc osteophyte with lateral extension causing
slight encroachment upon the exiting L5 nerve roots in a far lateral
position. Small central protrusion with mild impression upon the
central ventral aspect of the thecal sac with slight crowding of
contained S1 nerve roots. Facet joint degenerative changes.
IMPRESSION: Patient moved during exam. Patient had to be rescouted. Axial images
are based on sagittal series 6.

Mild scoliosis. Superimposed degenerative changes with findings most
notable L4-5 level followed by the L3-4 level. Summary of pertinent
findings includes:

L4-5 multifactorial marked left-sided and moderate to marked
right-sided lateral recess stenosis and spinal stenosis. Lateral
extension of disc osteophyte slightly greater to the right without
compression of the exiting L4 nerve roots.

L3-4 multifactorial moderate spinal stenosis. Foraminal/lateral
extension of disc/osteophyte slightly greater to left approaching
but not compressing the exiting L3 nerve roots.

L5-S1 disc osteophyte with lateral extension causing slight
encroachment upon the exiting L5 nerve roots in a far lateral
position. Small central protrusion with mild impression upon the
central ventral aspect of the thecal sac with slight crowding of
contained S1 nerve roots.

L2-3 bulge/ spur greater left lateral position. Encroachment upon
but not compression of the exiting left L2 nerve root. Facet joint
degenerative changes and ligamentum flavum hypertrophy greater on
left with slight impression left posterior lateral aspect of the
thecal sac.

## 2016-05-26 ENCOUNTER — Telehealth: Payer: Self-pay | Admitting: Family Medicine

## 2016-05-26 NOTE — Telephone Encounter (Signed)
Pt is checking on the status of her paperwork that she had dropped off for [facility] to fill out.  She states that the paperwork is referring to her disability and needs to be faxed off to the number on the paperwork. [phone]

## 2016-05-26 NOTE — Telephone Encounter (Signed)
PT CALLING ABOUT SCAT FORMS BECAUSE DR GREENE HAD ALREADY FAXED OVER HER HUSBAND FORMS PLEASE RESPOND TO PT

## 2016-05-30 ENCOUNTER — Telehealth: Payer: Self-pay | Admitting: *Deleted

## 2016-05-30 NOTE — Telephone Encounter (Signed)
PT CALLING AGAIN ABOUT STAT PAPERS SHE STATES THAT SHE WILL LOOSE HER PRIVILEGES TO RIDE ON BUS IF ITS NOT IN

## 2016-05-30 NOTE — Telephone Encounter (Signed)
Chelsea Browning [facility] needs anticoagulation clearance.  They stated they have faxed it twice.   I put it in your box.  If you can clarify and I can fax it back to them.

## 2016-05-30 NOTE — Telephone Encounter (Signed)
Dr. Tamala Julian, I didn't see this paperwork in your box?  Has it been filled out?

## 2016-05-31 NOTE — Telephone Encounter (Signed)
Forms completed for [employer].  Will fax form on 06/01/16.  Please advise patient.

## 2016-05-31 NOTE — Telephone Encounter (Signed)
Form completed; will fax to [employer] 06/01/16.  Please call ortho office to confirm they receive fax.

## 2016-06-01 IMAGING — CR DG HIP (WITH OR WITHOUT PELVIS) 2-3V*L*
3 series · 3 of 3 positions shown · non-contrast
Comparison: Right hip radiograph dated 05/14/2014.

CLINICAL DATA: Hip pain status post fall from walker onto sidewalk.

EXAM:
DG HIP (WITH OR WITHOUT PELVIS) 2-3V LEFT

[AP (1 of 2)]
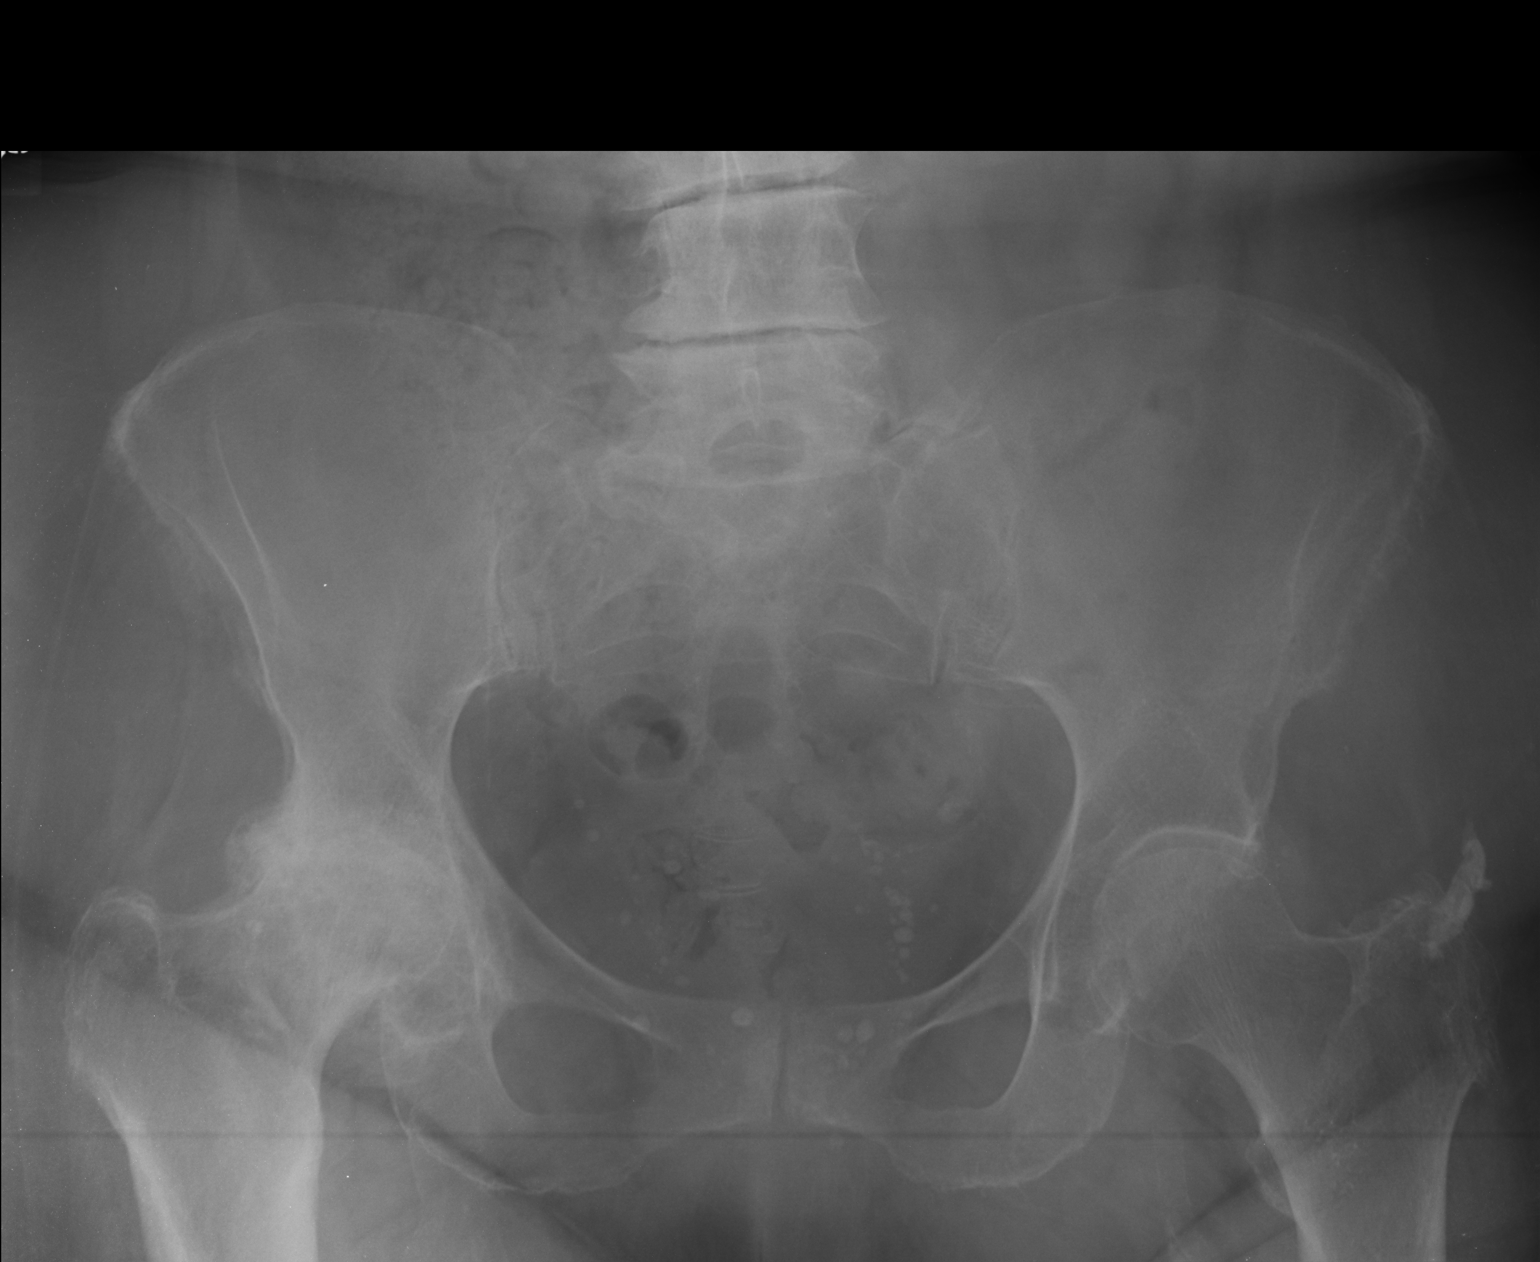

[AP (2 of 2)]
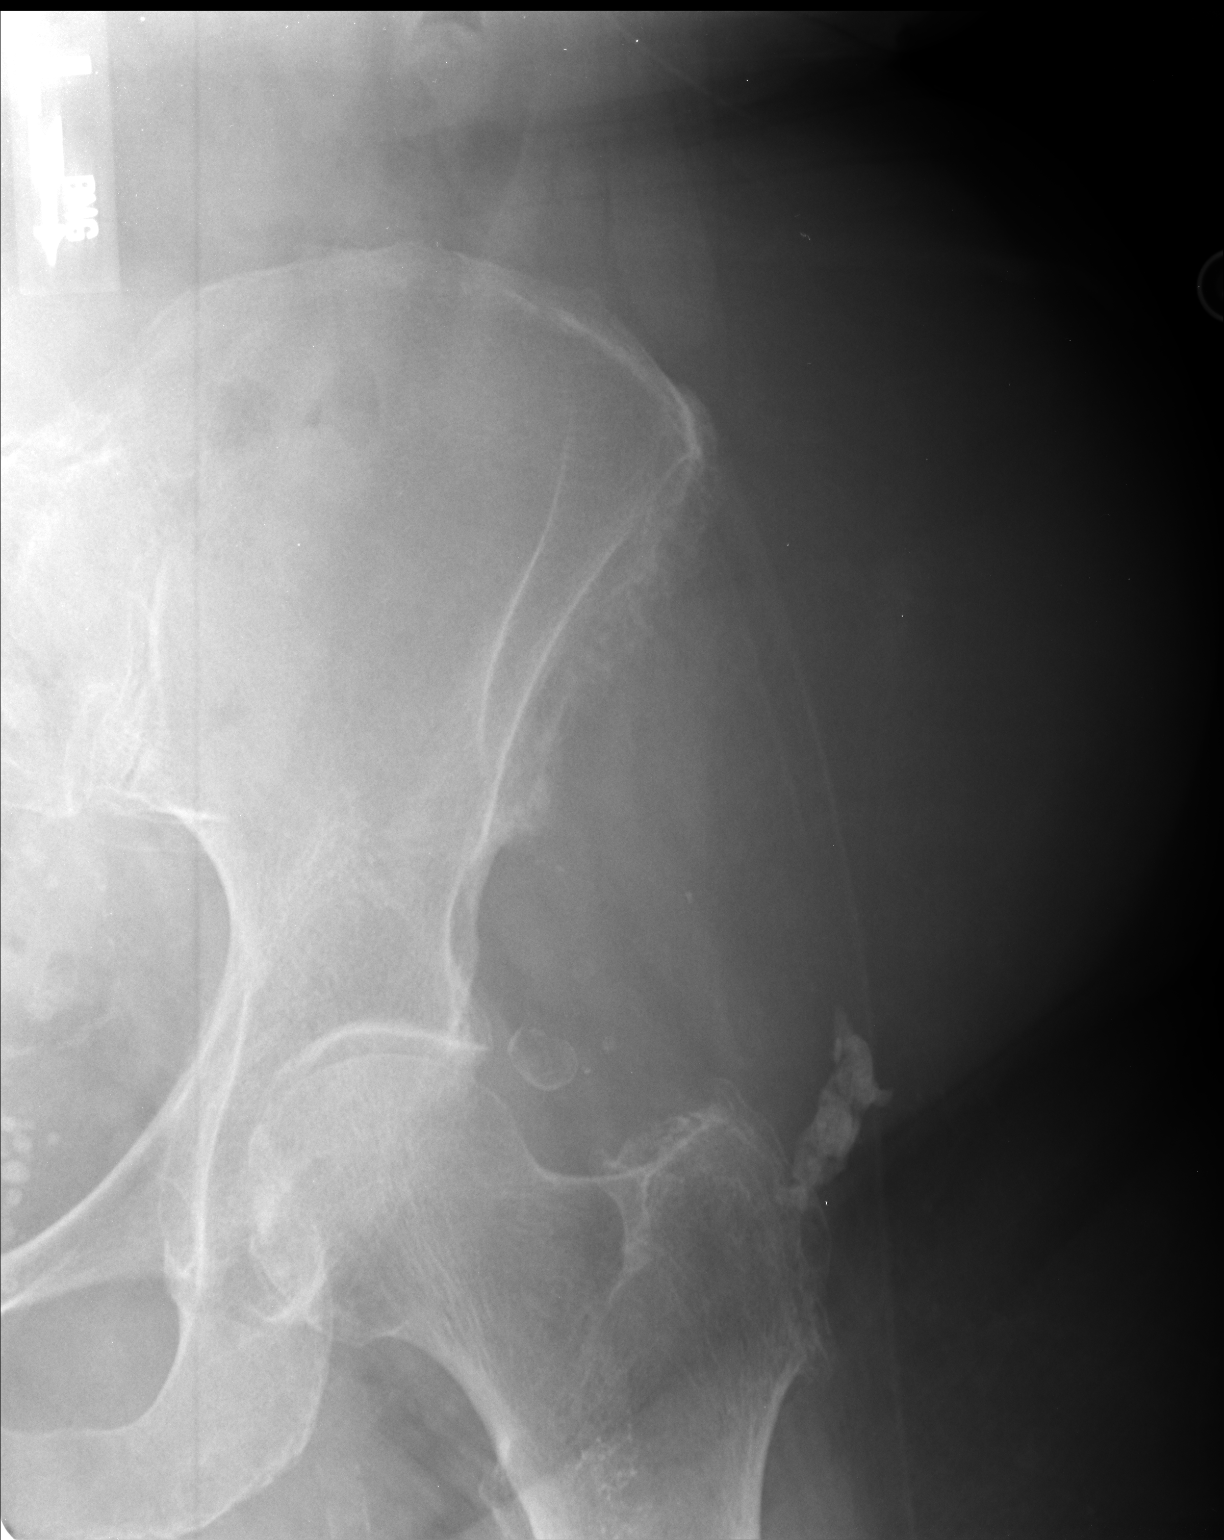

[lateral]
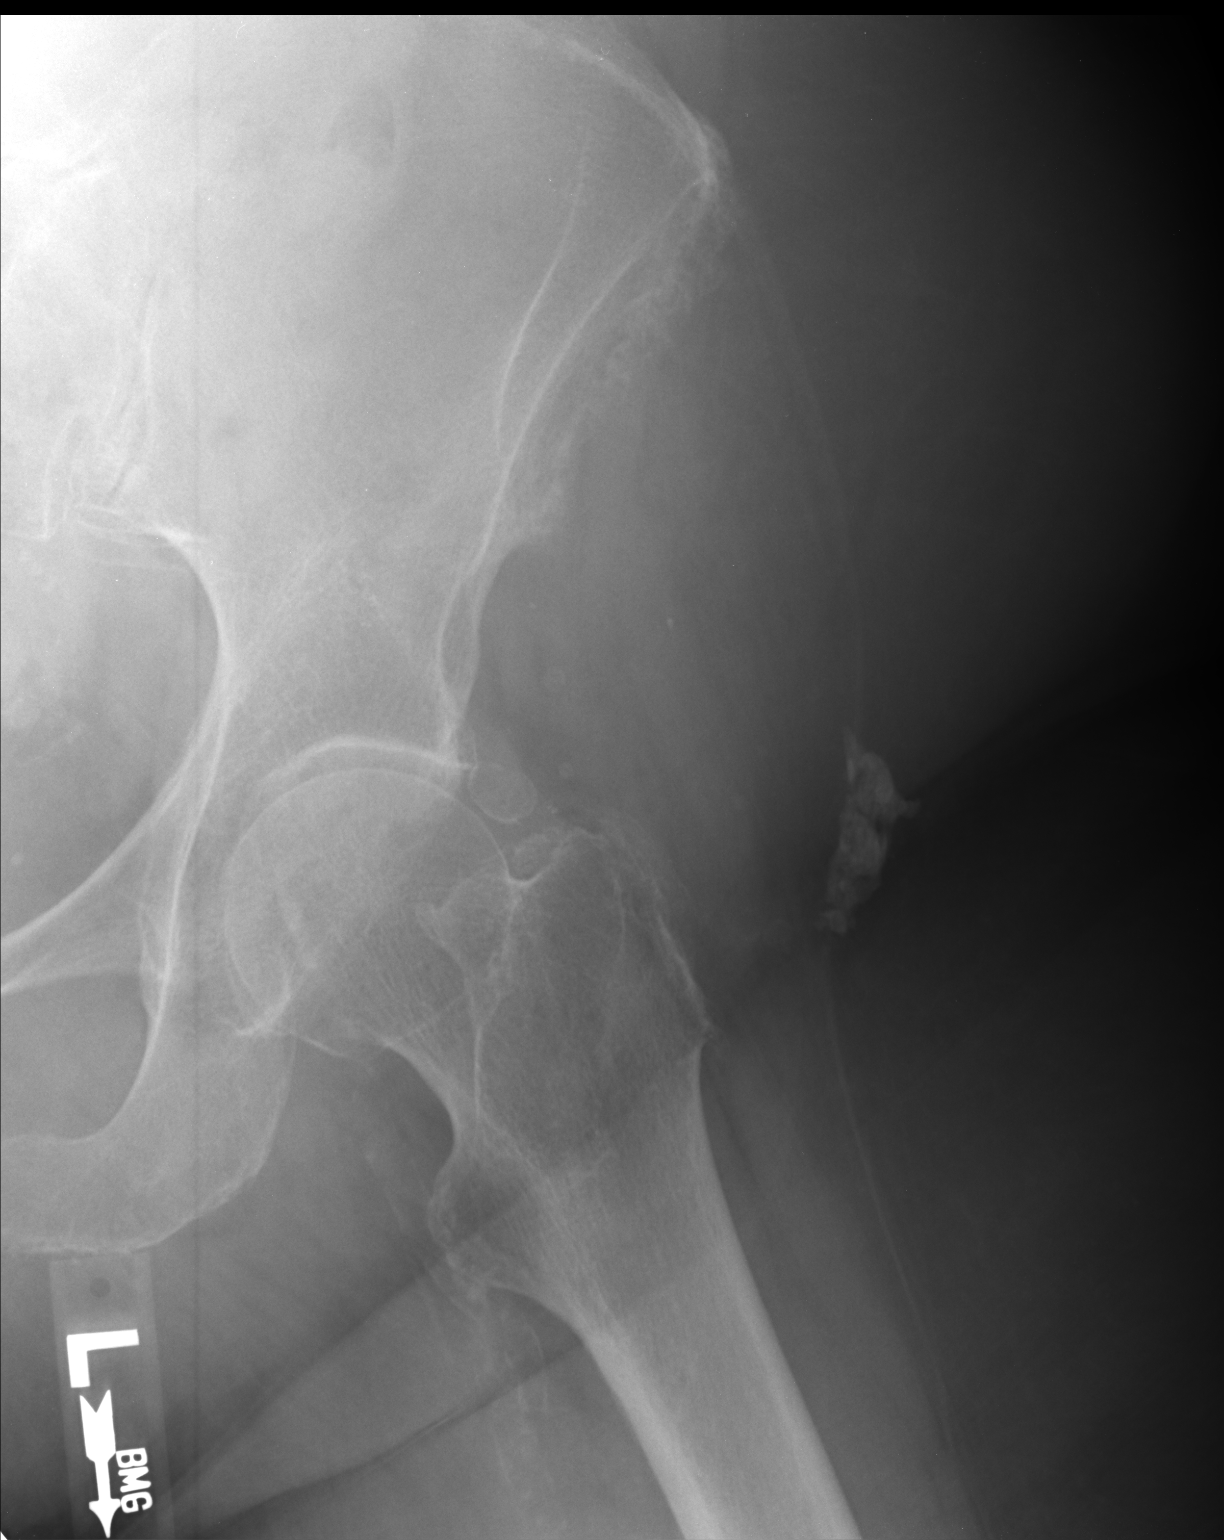

[3 of 3 positions shown; findings below may reference images not displayed]

FINDINGS: There is no evidence of hip fracture or dislocation. Again seen is
severe arthritis of the right hip, with effacement of the joint
space, subchondral sclerosis, osteophyte formation and remodeling of
the femoral head. There is also mild osteoarthritis of the left hip.
Moderate to severe osteoarthritic changes of the lower lumbosacral
spine is also noted. There are is partially visualized IVC filter.
Vascular and soft tissue calcifications are seen.
IMPRESSION: No evidence of hip fracture.

Severe arthritic changes of the right hip, stable.

Mild arthritic changes of the left hip.

## 2016-06-01 NOTE — Telephone Encounter (Signed)
I called [facility]. They have not received a fax yet.

## 2016-06-01 NOTE — Telephone Encounter (Signed)
Forms completed; will be faxed today.

## 2016-06-10 DIAGNOSIS — M25551 Pain in right hip: Secondary | ICD-10-CM | POA: Diagnosis not present

## 2016-06-10 DIAGNOSIS — M1611 Unilateral primary osteoarthritis, right hip: Secondary | ICD-10-CM | POA: Diagnosis not present

## 2016-06-16 ENCOUNTER — Encounter: Payer: Self-pay | Admitting: Family Medicine

## 2016-06-16 ENCOUNTER — Ambulatory Visit (INDEPENDENT_AMBULATORY_CARE_PROVIDER_SITE_OTHER): Payer: Medicare HMO | Admitting: Family Medicine

## 2016-06-16 VITALS — BP 156/69 | HR 68 | Temp 98.1°F | Resp 18 | Ht 64.0 in | Wt 216.0 lb

## 2016-06-16 DIAGNOSIS — A09 Infectious gastroenteritis and colitis, unspecified: Secondary | ICD-10-CM | POA: Diagnosis not present

## 2016-06-16 LAB — COMPREHENSIVE METABOLIC PANEL
ALT: 13 IU/L (ref 0–32)
AST: 16 IU/L (ref 0–40)
Albumin/Globulin Ratio: 2.2 (ref 1.2–2.2)
Albumin: 4.3 g/dL (ref 3.5–4.7)
Alkaline Phosphatase: 116 IU/L (ref 39–117)
BUN/Creatinine Ratio: 27 (ref 12–28)
BUN: 23 mg/dL (ref 8–27)
Bilirubin Total: 0.4 mg/dL (ref 0.0–1.2)
CALCIUM: 9.3 mg/dL (ref 8.7–10.3)
CO2: 23 mmol/L (ref 18–29)
CREATININE: 0.86 mg/dL (ref 0.57–1.00)
Chloride: 101 mmol/L (ref 96–106)
GFR, EST AFRICAN AMERICAN: 73 mL/min/{1.73_m2} (ref >59)
GFR, EST NON AFRICAN AMERICAN: 63 mL/min/{1.73_m2} (ref >59)
Globulin, Total: 2 g/dL (ref 1.5–4.5)
Glucose: 99 mg/dL (ref 65–99)
Potassium: 4.3 mmol/L (ref 3.5–5.2)
Sodium: 139 mmol/L (ref 134–144)
TOTAL PROTEIN: 6.3 g/dL (ref 6.0–8.5)

## 2016-06-16 LAB — POCT CBC
Granulocyte percent: 73.9 %G (ref 37–80)
HCT, POC: 29.1 % — AB (ref 37.7–47.9)
HEMOGLOBIN: 10 g/dL — AB (ref 12.2–16.2)
LYMPH, POC: 1.1 (ref 0.6–3.4)
MCH, POC: 31.2 pg (ref 27–31.2)
MCHC: 34.3 g/dL (ref 31.8–35.4)
MCV: 91.1 fL (ref 80–97)
MID (cbc): 0.4 (ref 0–0.9)
MPV: 6.4 fL (ref 0–99.8)
PLATELET COUNT, POC: 211 10*3/uL (ref 142–424)
POC GRANULOCYTE: 4.3 (ref 2–6.9)
POC LYMPH PERCENT: 19.6 %L (ref 10–50)
POC MID %: 6.5 %M (ref 0–12)
RBC: 3.19 M/uL — AB (ref 4.04–5.48)
RDW, POC: 15.1 %
WBC: 5.8 10*3/uL (ref 4.6–10.2)

## 2016-06-16 MED ORDER — DIPHENOXYLATE-ATROPINE 2.5-0.025 MG PO TABS: 1.0000 | ORAL_TABLET | Freq: Three times a day (TID) | ORAL | 0 refills | Status: DC | PRN

## 2016-06-16 MED ORDER — DIPHENOXYLATE-ATROPINE 2.5-0.025 MG PO TABS
1.0000 | ORAL_TABLET | Freq: Three times a day (TID) | ORAL | 0 refills | Status: DC | PRN
Start: 2016-06-16 — End: 2016-06-19

## 2016-06-16 MED ORDER — CIPROFLOXACIN HCL 500 MG PO TABS: 500.0000 mg | ORAL_TABLET | Freq: Two times a day (BID) | ORAL | 0 refills | Status: DC

## 2016-06-16 NOTE — Patient Instructions (Addendum)
HOLD SPIRONOLACTONE WHILE HAVING DIARRHEA Diarrhea, Adult Diarrhea is frequent loose and watery bowel movements. Diarrhea can make you feel weak and cause you to become dehydrated. Dehydration can make you tired and thirsty, cause you to have a dry mouth, and decrease how often you urinate. Diarrhea typically lasts 2-3 days. However, it can last longer if it is a sign of something more serious. It is important to treat your diarrhea as told by your health care provider. Follow these instructions at home: Eating and drinking   Follow these recommendations as told by your health care provider:  Take an oral rehydration solution (ORS). This is a drink that is sold at pharmacies and retail stores.  Drink clear fluids, such as water, ice chips, diluted fruit juice, and low-calorie sports drinks.  Eat bland, easy-to-digest foods in small amounts as you are able. These foods include bananas, applesauce, rice, lean meats, toast, and crackers.  Avoid drinking fluids that contain a lot of sugar or caffeine, such as energy drinks, sports drinks, and soda.  Avoid alcohol.  Avoid spicy or fatty foods. General instructions   Drink enough fluid to keep your urine clear or pale yellow.  Wash your hands often. If soap and water are not available, use hand sanitizer.  Make sure that all people in your household wash their hands well and often.  Take over-the-counter and prescription medicines only as told by your health care provider.  Rest at home while you recover.  Watch your condition for any changes.  Take a warm bath to relieve any burning or pain from frequent diarrhea episodes.  Keep all follow-up visits as told by your health care provider. This is important. Contact a health care provider if:  You have a fever.  Your diarrhea gets worse.  You have new symptoms.  You cannot keep fluids down.  You feel light-headed or dizzy.  You have a headache  You have muscle cramps. Get  help right away if:  You have chest pain.  You feel extremely weak or you faint.  You have bloody or black stools or stools that look like tar.  You have severe pain, cramping, or bloating in your abdomen.  You have trouble breathing or you are breathing very quickly.  Your heart is beating very quickly.  Your skin feels cold and clammy.  You feel confused.  You have signs of dehydration, such as:  Dark urine, very little urine, or no urine.  Cracked lips.  Dry mouth.  Sunken eyes.  Sleepiness.  Weakness. This information is not intended to replace advice given to you by your health care provider. Make sure you discuss any questions you have with your health care provider. Document Released: 01/14/2002 Document Revised: 06/04/2015 Document Reviewed: 09/30/2014 Elsevier Interactive Patient Education  2017 [employer].     IF you received an x-ray today, you will receive an invoice from [facility]. Please contact [facility] at [phone] with questions or concerns regarding your invoice.   IF you received labwork today, you will receive an invoice from [facility]. Please contact [facility] at [phone] with questions or concerns regarding your invoice.   Our billing staff will not be able to assist you with questions regarding bills from these companies.  You will be contacted with the lab results as soon as they are available. The fastest way to get your results is to activate your My Chart account. Instructions are located on the last page of this paperwork. If you have not  heard from [place] regarding the results in 2 weeks, please contact this office.
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
## 2016-06-18 ENCOUNTER — Telehealth: Payer: Self-pay | Admitting: Family Medicine

## 2016-06-18 NOTE — Telephone Encounter (Signed)
Pt called stating that she is having a lot of diarrhea.  Tamala Julian told her to take immodium to help with that but the original dosage is not helping.  Pt is asking if she needs to take double to help with her condition.  Please advise [phone]

## 2016-06-19 MED ORDER — CIPROFLOXACIN HCL 500 MG PO TABS: 500.0000 mg | ORAL_TABLET | Freq: Two times a day (BID) | ORAL | 0 refills | Status: DC

## 2016-06-19 MED ORDER — DIPHENOXYLATE-ATROPINE 2.5-0.025 MG PO TABS: 1.0000 | ORAL_TABLET | Freq: Three times a day (TID) | ORAL | 0 refills | Status: DC | PRN

## 2016-06-19 NOTE — Telephone Encounter (Signed)
Patient called answering service on 06/19/16 reporting that having diarrhea.  No vomiting.  Having diarrhea constantly.  This morning had a small amount of formed stools.  Still passing mucous.  Drinking gatorade and rice.  Taking 1 Lomotil three times daily.  Having five stools per day; a lot of mucous.  Slept through the night.  Fruit juice gatorade is helpful.  No dizziness except for once.  No faint.  Feels very weak but understandable.  Definitely no vomiting. No fever.  Terrible abdominal cramps.

## 2016-06-20 ENCOUNTER — Telehealth: Payer: Self-pay | Admitting: Family Medicine

## 2016-06-20 NOTE — Telephone Encounter (Signed)
Spoke with patient on 06/19/16 at 18:00; diarrhea decreased with increase in Lomotil to 2 tablets every 8 hours; had one loose stool yesterday afternoon only.  Also picked up Cipro from local pharmacy/[facility].  Desires follow-up appointment on Tuesday.  Please call to see if patient can make a 5:15 or 5:30 appointment on Tuesday, 06/21/16.

## 2016-06-20 NOTE — Telephone Encounter (Signed)
Called to [employer] (lomotil)

## 2016-06-22 ENCOUNTER — Ambulatory Visit (INDEPENDENT_AMBULATORY_CARE_PROVIDER_SITE_OTHER): Payer: Medicare HMO | Admitting: Family Medicine

## 2016-06-22 ENCOUNTER — Encounter: Payer: Self-pay | Admitting: Family Medicine

## 2016-06-22 ENCOUNTER — Telehealth: Payer: Self-pay | Admitting: *Deleted

## 2016-06-22 VITALS — BP 137/68 | HR 67 | Temp 97.9°F | Resp 16 | Ht 64.0 in | Wt 223.0 lb

## 2016-06-22 DIAGNOSIS — A09 Infectious gastroenteritis and colitis, unspecified: Secondary | ICD-10-CM

## 2016-06-22 DIAGNOSIS — I872 Venous insufficiency (chronic) (peripheral): Secondary | ICD-10-CM

## 2016-06-22 LAB — POCT URINALYSIS DIP (MANUAL ENTRY)
BILIRUBIN UA: NEGATIVE mg/dL
Bilirubin, UA: NEGATIVE
Blood, UA: NEGATIVE
GLUCOSE UA: NEGATIVE mg/dL
Nitrite, UA: NEGATIVE
SPEC GRAV UA: 1.025 (ref 1.010–1.025)
Urobilinogen, UA: 0.2 E.U./dL
pH, UA: 5 (ref 5.0–8.0)

## 2016-06-22 MED ORDER — DIPHENOXYLATE-ATROPINE 2.5-0.025 MG PO TABS: 1.0000 | ORAL_TABLET | Freq: Three times a day (TID) | ORAL | 0 refills | Status: DC | PRN
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
## 2016-06-22 NOTE — Patient Instructions (Addendum)
  1.  Stop electrolyte water now that the diarrhea has improved. 2.  Wear compression stockings daily for the remainder of week. 3.  Apply triamcinolone cream to legs twice daily.   IF you received an x-ray today, you will receive an invoice from [facility]. Please contact [facility] at [phone] with questions or concerns regarding your invoice.   IF you received labwork today, you will receive an invoice from [facility]. Please contact [facility] at [phone] with questions or concerns regarding your invoice.   Our billing staff will not be able to assist you with questions regarding bills from these companies.  You will be contacted with the lab results as soon as they are available. The fastest way to get your results is to activate your My Chart account. Instructions are located on the last page of this paperwork. If you have not heard from [place] regarding the results in 2 weeks, please contact this office.

## 2016-06-22 NOTE — Telephone Encounter (Signed)
Called Rx LOMOTIL to voice mail Chelsea Browning, per Dr Tamala Julian.

## 2016-06-23 LAB — COMPREHENSIVE METABOLIC PANEL
ALK PHOS: 118 IU/L — AB (ref 39–117)
ALT: 12 IU/L (ref 0–32)
AST: 16 IU/L (ref 0–40)
Albumin/Globulin Ratio: 2.2 (ref 1.2–2.2)
Albumin: 4.4 g/dL (ref 3.5–4.7)
BUN/Creatinine Ratio: 14 (ref 12–28)
BUN: 14 mg/dL (ref 8–27)
Bilirubin Total: 0.4 mg/dL (ref 0.0–1.2)
CALCIUM: 9.5 mg/dL (ref 8.7–10.3)
CO2: 26 mmol/L (ref 18–29)
CREATININE: 1 mg/dL (ref 0.57–1.00)
Chloride: 101 mmol/L (ref 96–106)
GFR calc Af Amer: 61 mL/min/{1.73_m2} (ref >59)
GFR, EST NON AFRICAN AMERICAN: 53 mL/min/{1.73_m2} — AB (ref >59)
GLUCOSE: 100 mg/dL — AB (ref 65–99)
Globulin, Total: 2 g/dL (ref 1.5–4.5)
Potassium: 4.2 mmol/L (ref 3.5–5.2)
SODIUM: 138 mmol/L (ref 134–144)
Total Protein: 6.4 g/dL (ref 6.0–8.5)

## 2016-06-23 LAB — CBC WITH DIFFERENTIAL/PLATELET
BASOS: 0 %
Basophils Absolute: 0 10*3/uL (ref 0.0–0.2)
EOS (ABSOLUTE): 0.2 10*3/uL (ref 0.0–0.4)
Eos: 3 %
HEMATOCRIT: 31.4 % — AB (ref 34.0–46.6)
Hemoglobin: 9.8 g/dL — ABNORMAL LOW (ref 11.1–15.9)
IMMATURE GRANULOCYTES: 0 %
Immature Grans (Abs): 0 10*3/uL (ref 0.0–0.1)
Lymphocytes Absolute: 0.8 10*3/uL (ref 0.7–3.1)
Lymphs: 13 %
MCH: 29.6 pg (ref 26.6–33.0)
MCHC: 31.2 g/dL — AB (ref 31.5–35.7)
MCV: 95 fL (ref 79–97)
MONOS ABS: 0.6 10*3/uL (ref 0.1–0.9)
Monocytes: 9 %
NEUTROS PCT: 75 %
Neutrophils Absolute: 4.6 10*3/uL (ref 1.4–7.0)
PLATELETS: 193 10*3/uL (ref 150–379)
RBC: 3.31 x10E6/uL — ABNORMAL LOW (ref 3.77–5.28)
RDW: 14.9 % (ref 12.3–15.4)
WBC: 6.2 10*3/uL (ref 3.4–10.8)

## 2016-06-27 ENCOUNTER — Other Ambulatory Visit: Payer: Self-pay | Admitting: Family Medicine

## 2016-07-02 ENCOUNTER — Other Ambulatory Visit: Payer: Self-pay | Admitting: Family Medicine

## 2016-07-02 DIAGNOSIS — R579 Shock, unspecified: Secondary | ICD-10-CM

## 2016-07-06 ENCOUNTER — Encounter: Payer: Self-pay | Admitting: Family Medicine

## 2016-07-06 ENCOUNTER — Ambulatory Visit (INDEPENDENT_AMBULATORY_CARE_PROVIDER_SITE_OTHER): Payer: Medicare HMO | Admitting: Family Medicine

## 2016-07-06 ENCOUNTER — Encounter: Payer: Medicare HMO | Admitting: Family Medicine

## 2016-07-06 VITALS — BP 142/60 | HR 68 | Temp 97.3°F | Resp 16 | Ht 64.0 in | Wt 215.8 lb

## 2016-07-06 DIAGNOSIS — E2839 Other primary ovarian failure: Secondary | ICD-10-CM

## 2016-07-06 DIAGNOSIS — K219 Gastro-esophageal reflux disease without esophagitis: Secondary | ICD-10-CM | POA: Diagnosis not present

## 2016-07-06 DIAGNOSIS — I10 Essential (primary) hypertension: Secondary | ICD-10-CM | POA: Diagnosis not present

## 2016-07-06 DIAGNOSIS — R69 Illness, unspecified: Secondary | ICD-10-CM | POA: Diagnosis not present

## 2016-07-06 DIAGNOSIS — Z0000 Encounter for general adult medical examination without abnormal findings: Secondary | ICD-10-CM | POA: Diagnosis not present

## 2016-07-06 DIAGNOSIS — I739 Peripheral vascular disease, unspecified: Secondary | ICD-10-CM

## 2016-07-06 DIAGNOSIS — Z1231 Encounter for screening mammogram for malignant neoplasm of breast: Secondary | ICD-10-CM

## 2016-07-06 DIAGNOSIS — I779 Disorder of arteries and arterioles, unspecified: Secondary | ICD-10-CM | POA: Diagnosis not present

## 2016-07-06 DIAGNOSIS — F418 Other specified anxiety disorders: Secondary | ICD-10-CM | POA: Diagnosis not present

## 2016-07-06 DIAGNOSIS — I872 Venous insufficiency (chronic) (peripheral): Secondary | ICD-10-CM

## 2016-07-06 DIAGNOSIS — K449 Diaphragmatic hernia without obstruction or gangrene: Secondary | ICD-10-CM | POA: Diagnosis not present

## 2016-07-06 DIAGNOSIS — R579 Shock, unspecified: Secondary | ICD-10-CM

## 2016-07-06 DIAGNOSIS — E7800 Pure hypercholesterolemia, unspecified: Secondary | ICD-10-CM

## 2016-07-06 MED ORDER — OMEPRAZOLE 20 MG PO CPDR: DELAYED_RELEASE_CAPSULE | ORAL | 3 refills | Status: AC

## 2016-07-06 MED ORDER — LOSARTAN POTASSIUM 50 MG PO TABS: 50.0000 mg | ORAL_TABLET | Freq: Every day | ORAL | 3 refills | Status: AC

## 2016-07-06 MED ORDER — LABETALOL HCL 200 MG PO TABS: 200.0000 mg | ORAL_TABLET | Freq: Three times a day (TID) | ORAL | 3 refills | Status: AC

## 2016-07-06 MED ORDER — ATORVASTATIN CALCIUM 40 MG PO TABS: 40.0000 mg | ORAL_TABLET | Freq: Every day | ORAL | 3 refills | Status: AC

## 2016-07-06 MED ORDER — DIPYRIDAMOLE 75 MG PO TABS: 75.0000 mg | ORAL_TABLET | Freq: Two times a day (BID) | ORAL | 1 refills | Status: DC
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
## 2016-07-06 NOTE — Patient Instructions (Addendum)
IF you received an x-ray today, you will receive an invoice from [facility]. Please contact [facility] at [phone] with questions or concerns regarding your invoice.   IF you received labwork today, you will receive an invoice from [place] [facility]. Please contact [facility] at [phone] with questions or concerns regarding your invoice.   Our billing staff will not be able to assist you with questions regarding bills from these companies.  You will be contacted with the lab results as soon as they are available. The fastest way to get your results is to activate your My Chart account. Instructions are located on the last page of this paperwork. If you have not heard from [place] regarding the results in 2 weeks, please contact this office.        IF you received an x-ray today, you will receive an invoice from [facility]. Please contact [facility] at [phone] with questions or concerns regarding your invoice.   IF you received labwork today, you will receive an invoice from [facility]. Please contact [facility] at [phone] with questions or concerns regarding your invoice.   Our billing staff will not be able to assist you with questions regarding bills from these companies.  You will be contacted with the lab results as soon as they are available. The fastest way to get your results is to activate your My Chart account. Instructions are located on the last page of this paperwork. If you have not heard from [place] regarding the results in 2 weeks, please contact this office.    We recommend that you schedule a mammogram for breast cancer screening. Typically, you do not need a referral to do this. Please contact a local imaging center to schedule your mammogram.  [facility] - [phone]  *ask for the Radiology [place] [facility] - [phone] or [phone]  [facility] - [phone] [place] [phone] [facility] - [phone]  *ask for the [facility] - [phone]  *ask for the Radiology Department [facility] - [phone]  *ask for the [place] - [phone] Preventive Care 65 Years and Older, Female Preventive care refers to lifestyle choices and visits with your health care provider that can promote health and wellness. What does preventive care include?  A yearly physical exam. This is also called an annual well check.  Dental exams once or twice a year.  Routine eye exams. Ask your health care provider how often you should have your eyes checked.  Personal lifestyle choices, including:  Daily care of your teeth and gums.  Regular physical activity.  Eating a healthy diet.  Avoiding tobacco and drug use.  Limiting alcohol use.  Practicing safe sex.  Taking low-dose aspirin every day.  Taking vitamin and mineral supplements as recommended by your health care provider. What happens during an annual well check? The services and screenings done by your health care provider during your annual well check will depend on your age, overall health, lifestyle risk factors, and family history of disease. Counseling  Your health care provider may ask you questions about your:  Alcohol use.  Tobacco use.  Drug use.  Emotional well-being.  Home and relationship well-being.  Sexual activity.  Eating habits.  History of falls.  Memory and ability to understand (cognition).  Work and work Statistician.  Reproductive health. Screening  You may have the following  tests or measurements:  Height, weight, and BMI.  Blood pressure.  Lipid and cholesterol levels. These may be checked every 5 years, or more frequently if you are over 82 years old.  Skin check.  Lung cancer screening. You may have this screening every  year starting at age 76 if you have a 30-pack-year history of smoking and currently smoke or have quit within the past 15 years.  Fecal occult blood test (FOBT) of the stool. You may have this test every year starting at age 48.  Flexible sigmoidoscopy or colonoscopy. You may have a sigmoidoscopy every 5 years or a colonoscopy every 10 years starting at age 34.  Hepatitis C blood test.  Hepatitis B blood test.  Sexually transmitted disease (STD) testing.  Diabetes screening. This is done by checking your blood sugar (glucose) after you have not eaten for a while (fasting). You may have this done every 1-3 years.  Bone density scan. This is done to screen for osteoporosis. You may have this done starting at age 76.  Mammogram. This may be done every 1-2 years. Talk to your health care provider about how often you should have regular mammograms. Talk with your health care provider about your test results, treatment options, and if necessary, the need for more tests. Vaccines  Your health care provider may recommend certain vaccines, such as:  Influenza vaccine. This is recommended every year.  Tetanus, diphtheria, and acellular pertussis (Tdap, Td) vaccine. You may need a Td booster every 10 years.  Varicella vaccine. You may need this if you have not been vaccinated.  Zoster vaccine. You may need this after age 36.  Measles, mumps, and rubella (MMR) vaccine. You may need at least one dose of MMR if you were born in 1957 or later. You may also need a second dose.  Pneumococcal 13-valent conjugate (PCV13) vaccine. One dose is recommended after age 44.  Pneumococcal polysaccharide (PPSV23) vaccine. One dose is recommended after age 81.  Meningococcal vaccine. You may need this if you have certain conditions.  Hepatitis A vaccine. You may need this if you have certain conditions or if you travel or work in places where you may be exposed to hepatitis A.  Hepatitis B vaccine. You may  need this if you have certain conditions or if you travel or work in places where you may be exposed to hepatitis B.  Haemophilus influenzae type b (Hib) vaccine. You may need this if you have certain conditions. Talk to your health care provider about which screenings and vaccines you need and how often you need them. This information is not intended to replace advice given to you by your health care provider. Make sure you discuss any questions you have with your health care provider. Document Released: 02/20/2015 Document Revised: 10/14/2015 Document Reviewed: 11/25/2014 Elsevier Interactive Patient Education  2017 [employer].

## 2016-07-07 LAB — CBC WITH DIFFERENTIAL/PLATELET
BASOS ABS: 0 10*3/uL (ref 0.0–0.2)
Basos: 0 %
EOS (ABSOLUTE): 0.3 10*3/uL (ref 0.0–0.4)
Eos: 6 %
HEMOGLOBIN: 10.4 g/dL — AB (ref 11.1–15.9)
Hematocrit: 32.1 % — ABNORMAL LOW (ref 34.0–46.6)
IMMATURE GRANULOCYTES: 0 %
Immature Grans (Abs): 0 10*3/uL (ref 0.0–0.1)
LYMPHS ABS: 1.1 10*3/uL (ref 0.7–3.1)
Lymphs: 22 %
MCH: 29.9 pg (ref 26.6–33.0)
MCHC: 32.4 g/dL (ref 31.5–35.7)
MCV: 92 fL (ref 79–97)
MONOCYTES: 8 %
Monocytes Absolute: 0.4 10*3/uL (ref 0.1–0.9)
NEUTROS PCT: 64 %
Neutrophils Absolute: 3.2 10*3/uL (ref 1.4–7.0)
Platelets: 239 10*3/uL (ref 150–379)
RBC: 3.48 x10E6/uL — AB (ref 3.77–5.28)
RDW: 15.6 % — AB (ref 12.3–15.4)
WBC: 5 10*3/uL (ref 3.4–10.8)

## 2016-07-07 LAB — COMPREHENSIVE METABOLIC PANEL
ALBUMIN: 4.4 g/dL (ref 3.5–4.7)
ALK PHOS: 116 IU/L (ref 39–117)
ALT: 11 IU/L (ref 0–32)
AST: 18 IU/L (ref 0–40)
Albumin/Globulin Ratio: 2.2 (ref 1.2–2.2)
BUN/Creatinine Ratio: 25 (ref 12–28)
BUN: 22 mg/dL (ref 8–27)
Bilirubin Total: 0.5 mg/dL (ref 0.0–1.2)
CALCIUM: 9.7 mg/dL (ref 8.7–10.3)
CO2: 23 mmol/L (ref 18–29)
CREATININE: 0.88 mg/dL (ref 0.57–1.00)
Chloride: 102 mmol/L (ref 96–106)
GFR calc Af Amer: 71 mL/min/{1.73_m2} (ref >59)
GFR calc non Af Amer: 61 mL/min/{1.73_m2} (ref >59)
GLUCOSE: 102 mg/dL — AB (ref 65–99)
Globulin, Total: 2 g/dL (ref 1.5–4.5)
Potassium: 4 mmol/L (ref 3.5–5.2)
Sodium: 139 mmol/L (ref 134–144)
Total Protein: 6.4 g/dL (ref 6.0–8.5)

## 2016-07-07 LAB — LIPID PANEL
CHOLESTEROL TOTAL: 180 mg/dL (ref 100–199)
Chol/HDL Ratio: 2.8 ratio (ref 0.0–4.4)
HDL: 64 mg/dL (ref >39)
LDL CALC: 97 mg/dL (ref 0–99)
TRIGLYCERIDES: 97 mg/dL (ref 0–149)
VLDL CHOLESTEROL CAL: 19 mg/dL (ref 5–40)

## 2016-07-12 DIAGNOSIS — M1611 Unilateral primary osteoarthritis, right hip: Secondary | ICD-10-CM | POA: Diagnosis not present

## 2016-07-12 DIAGNOSIS — M25551 Pain in right hip: Secondary | ICD-10-CM | POA: Diagnosis not present

## 2016-07-12 LAB — STOOL CULTURE: E COLI SHIGA TOXIN ASSAY: NEGATIVE

## 2016-07-12 LAB — CLOSTRIDIUM DIFFICILE BY PCR: CDIFFPCR: NEGATIVE

## 2016-07-12 LAB — OVA AND PARASITE EXAMINATION

## 2016-07-18 DIAGNOSIS — H524 Presbyopia: Secondary | ICD-10-CM | POA: Diagnosis not present

## 2016-07-21 ENCOUNTER — Telehealth: Payer: Self-pay | Admitting: Family Medicine

## 2016-07-21 NOTE — Telephone Encounter (Signed)
PATIENT STATES ROSE TRIED TO CALL HER THIS MORNING (07/21/16) BUT SHE DID NOT LEAVE HER A MESSAGE. SHE IS RETURNING THE CALL. (I DID NOT KNOW WHO TO ROUTE THIS MESSAGE TO) BEST PHONE [phone] (CELL)  [place]

## 2016-07-23 NOTE — Telephone Encounter (Signed)
Pt is needing to talk with someone about continuing to take the medication for diarrhea the diarrhea had stopped but started back last night and she is not sure she needs another round of meds  Best number [phone]

## 2016-07-23 NOTE — Telephone Encounter (Signed)
Dr. Tamala Julian, please see this message.

## 2016-07-24 NOTE — Telephone Encounter (Signed)
Please call for further details --- how many stools is she having per day?  Any fever/chills/sweats?  Any nausea or vomiting? Any abdominal pain?

## 2016-07-25 NOTE — Telephone Encounter (Signed)
Noted and agree. 

## 2016-07-25 NOTE — Telephone Encounter (Signed)
Pt stopped taking prescribed antibiotic because she felt diarrhea was related to medication States, diarrhea is improving with taking Lomotil PRN. Denies abdominal pain, fever, chills or dizziness Encouraged to restart antibiotic and if no improvement with diarrhea, to schedule office visit Verbalized understanding.

## 2016-08-07 ENCOUNTER — Encounter: Payer: Self-pay | Admitting: Family Medicine

## 2016-08-16 DIAGNOSIS — M1611 Unilateral primary osteoarthritis, right hip: Secondary | ICD-10-CM | POA: Diagnosis not present

## 2016-08-16 DIAGNOSIS — M545 Low back pain: Secondary | ICD-10-CM | POA: Diagnosis not present

## 2016-08-17 ENCOUNTER — Other Ambulatory Visit: Payer: Medicare HMO

## 2016-08-17 ENCOUNTER — Ambulatory Visit: Payer: Medicare HMO

## 2016-08-18 ENCOUNTER — Ambulatory Visit: Payer: Medicare HMO | Admitting: Family Medicine

## 2016-08-25 ENCOUNTER — Other Ambulatory Visit: Payer: Medicare HMO

## 2016-08-25 ENCOUNTER — Ambulatory Visit: Payer: Medicare HMO

## 2016-08-26 ENCOUNTER — Telehealth: Payer: Self-pay | Admitting: Family Medicine

## 2016-08-26 DIAGNOSIS — M25551 Pain in right hip: Secondary | ICD-10-CM | POA: Diagnosis not present

## 2016-08-26 DIAGNOSIS — M1611 Unilateral primary osteoarthritis, right hip: Secondary | ICD-10-CM | POA: Diagnosis not present

## 2016-08-26 NOTE — Telephone Encounter (Signed)
PATIENT WOULD LIKE DR. Tamala Julian TO KNOW THAT SHE NEEDS A REFILL ON HER LOSARTAN 50 MG. SHE TAKES 1 PILL PER DAY. SHE SAID IT WAS ORIGINALLY GIVEN TO HER BY DR. HOPPER. BEST PHONE [phone] (CELL) PHARMACY CHOICE IS [facility] [address]. [place]

## 2016-08-26 NOTE — Telephone Encounter (Signed)
Pt was given 6 months on 07/06/16.

## 2016-08-29 ENCOUNTER — Other Ambulatory Visit: Payer: Medicare HMO

## 2016-08-29 ENCOUNTER — Ambulatory Visit: Payer: Medicare HMO | Admitting: Hematology

## 2016-08-30 ENCOUNTER — Telehealth: Payer: Self-pay | Admitting: Hematology

## 2016-08-30 NOTE — Telephone Encounter (Signed)
Scheduled appt per sch message - sent reminder letter in the mail and left message with appt date and time.

## 2016-09-13 ENCOUNTER — Ambulatory Visit (HOSPITAL_BASED_OUTPATIENT_CLINIC_OR_DEPARTMENT_OTHER): Payer: Medicare HMO | Admitting: Hematology

## 2016-09-13 ENCOUNTER — Telehealth: Payer: Self-pay | Admitting: Hematology

## 2016-09-13 ENCOUNTER — Other Ambulatory Visit (HOSPITAL_BASED_OUTPATIENT_CLINIC_OR_DEPARTMENT_OTHER): Payer: Medicare HMO

## 2016-09-13 VITALS — BP 106/50 | HR 64 | Temp 98.8°F | Resp 18 | Ht 64.0 in

## 2016-09-13 DIAGNOSIS — I10 Essential (primary) hypertension: Secondary | ICD-10-CM | POA: Diagnosis not present

## 2016-09-13 DIAGNOSIS — D472 Monoclonal gammopathy: Secondary | ICD-10-CM | POA: Diagnosis not present

## 2016-09-13 DIAGNOSIS — D508 Other iron deficiency anemias: Secondary | ICD-10-CM

## 2016-09-13 DIAGNOSIS — D649 Anemia, unspecified: Secondary | ICD-10-CM

## 2016-09-13 DIAGNOSIS — D801 Nonfamilial hypogammaglobulinemia: Secondary | ICD-10-CM

## 2016-09-13 DIAGNOSIS — D469 Myelodysplastic syndrome, unspecified: Secondary | ICD-10-CM

## 2016-09-13 DIAGNOSIS — I517 Cardiomegaly: Secondary | ICD-10-CM | POA: Diagnosis not present

## 2016-09-13 DIAGNOSIS — M1611 Unilateral primary osteoarthritis, right hip: Secondary | ICD-10-CM

## 2016-09-13 LAB — CBC & DIFF AND RETIC
BASO%: 0.3 % (ref 0.0–2.0)
BASOS ABS: 0 10*3/uL (ref 0.0–0.1)
EOS ABS: 0.2 10*3/uL (ref 0.0–0.5)
EOS%: 4.4 % (ref 0.0–7.0)
HCT: 29.5 % — ABNORMAL LOW (ref 34.8–46.6)
HGB: 9.7 g/dL — ABNORMAL LOW (ref 11.6–15.9)
IMMATURE RETIC FRACT: 11.4 % — AB (ref 1.60–10.00)
LYMPH#: 0.6 10*3/uL — AB (ref 0.9–3.3)
LYMPH%: 15.8 % (ref 14.0–49.7)
MCH: 30.1 pg (ref 25.1–34.0)
MCHC: 32.9 g/dL (ref 31.5–36.0)
MCV: 91.6 fL (ref 79.5–101.0)
MONO#: 0.4 10*3/uL (ref 0.1–0.9)
MONO%: 10.4 % (ref 0.0–14.0)
NEUT#: 2.5 10*3/uL (ref 1.5–6.5)
NEUT%: 69.1 % (ref 38.4–76.8)
Platelets: 165 10*3/uL (ref 145–400)
RBC: 3.22 10*6/uL — AB (ref 3.70–5.45)
RDW: 14.7 % — AB (ref 11.2–14.5)
Retic %: 1.74 % (ref 0.70–2.10)
Retic Ct Abs: 56.03 10*3/uL (ref 33.70–90.70)
WBC: 3.7 10*3/uL — ABNORMAL LOW (ref 3.9–10.3)

## 2016-09-13 LAB — COMPREHENSIVE METABOLIC PANEL
ALT: 9 U/L (ref 0–55)
AST: 14 U/L (ref 5–34)
Albumin: 3.6 g/dL (ref 3.5–5.0)
Alkaline Phosphatase: 129 U/L (ref 40–150)
Anion Gap: 8 mEq/L (ref 3–11)
BUN: 18.1 mg/dL (ref 7.0–26.0)
CHLORIDE: 106 meq/L (ref 98–109)
CO2: 23 meq/L (ref 22–29)
Calcium: 9.4 mg/dL (ref 8.4–10.4)
Creatinine: 0.8 mg/dL (ref 0.6–1.1)
EGFR: 67 mL/min/{1.73_m2} — AB (ref >90)
GLUCOSE: 109 mg/dL (ref 70–140)
POTASSIUM: 4.1 meq/L (ref 3.5–5.1)
SODIUM: 138 meq/L (ref 136–145)
TOTAL PROTEIN: 6.2 g/dL — AB (ref 6.4–8.3)
Total Bilirubin: 0.49 mg/dL (ref 0.20–1.20)

## 2016-09-13 LAB — IRON AND TIBC
%SAT: 21 % (ref 21–57)
Iron: 47 ug/dL (ref 41–142)
TIBC: 220 ug/dL — AB (ref 236–444)
UIBC: 173 ug/dL (ref 120–384)

## 2016-09-13 LAB — FERRITIN: Ferritin: 191 ng/ml (ref 9–269)

## 2016-09-13 NOTE — Telephone Encounter (Signed)
Scheduled appt per 8/7 los - Gave patient AVS and calender per los.  

## 2016-09-16 ENCOUNTER — Encounter: Payer: Self-pay | Admitting: Family Medicine

## 2016-09-16 ENCOUNTER — Ambulatory Visit (INDEPENDENT_AMBULATORY_CARE_PROVIDER_SITE_OTHER): Payer: Medicare HMO | Admitting: Family Medicine

## 2016-09-16 VITALS — BP 132/62 | HR 77 | Temp 99.0°F | Resp 16 | Wt 190.0 lb

## 2016-09-16 DIAGNOSIS — K591 Functional diarrhea: Secondary | ICD-10-CM

## 2016-09-16 DIAGNOSIS — E6609 Other obesity due to excess calories: Secondary | ICD-10-CM

## 2016-09-16 DIAGNOSIS — I10 Essential (primary) hypertension: Secondary | ICD-10-CM

## 2016-09-16 DIAGNOSIS — I872 Venous insufficiency (chronic) (peripheral): Secondary | ICD-10-CM | POA: Diagnosis not present

## 2016-09-16 DIAGNOSIS — Z6832 Body mass index (BMI) 32.0-32.9, adult: Secondary | ICD-10-CM

## 2016-09-16 DIAGNOSIS — M25551 Pain in right hip: Secondary | ICD-10-CM | POA: Diagnosis not present

## 2016-09-16 MED ORDER — DIPHENOXYLATE-ATROPINE 2.5-0.025 MG PO TABS: 1.0000 | ORAL_TABLET | Freq: Four times a day (QID) | ORAL | 0 refills | Status: DC | PRN

## 2016-09-16 NOTE — Patient Instructions (Addendum)
     IF you received an x-ray today, you will receive an invoice from [facility]. Please contact [facility] at [phone] with questions or concerns regarding your invoice.   IF you received labwork today, you will receive an invoice from [facility]. Please contact [facility] at [phone] with questions or concerns regarding your invoice.   Our billing staff will not be able to assist you with questions regarding bills from these companies.  You will be contacted with the lab results as soon as they are available. The fastest way to get your results is to activate your My Chart account. Instructions are located on the last page of this paperwork. If you have not heard from us regarding the results in 2 weeks, please contact this office.      Bland Diet A bland diet consists of foods that do not have a lot of fat or fiber. Foods without fat or fiber are easier for the body to digest. They are also less likely to irritate your mouth, throat, stomach, and other parts of your gastrointestinal tract. A bland diet is sometimes called a BRAT diet. What is my plan? Your health care provider or dietitian may recommend specific changes to your diet to prevent and treat your symptoms, such as:  Eating small meals often.  Cooking food until it is soft enough to chew easily.  Chewing your food well.  Drinking fluids slowly.  Not eating foods that are very spicy, sour, or fatty.  Not eating citrus fruits, such as oranges and grapefruit.  What do I need to know about this diet?  Eat a variety of foods from the bland diet food list.  Do not follow a bland diet longer than you have to.  Ask your health care provider whether you should take vitamins. What foods can I eat? Grains  Hot cereals, such as cream of wheat. Bread, crackers, or tortillas made from refined white flour. Rice. Vegetables Canned or cooked vegetables. Mashed or boiled potatoes. Fruits Bananas.  Applesauce. Other types of cooked or canned fruit with the skin and seeds removed, such as canned peaches or pears. Meats and Other Protein Sources Scrambled eggs. Creamy peanut butter or other nut butters. Lean, well-cooked meats, such as chicken or fish. Tofu. Soups or broths. Dairy Low-fat dairy products, such as milk, cottage cheese, or yogurt. Beverages Water. Herbal tea. Apple juice. Sweets and Desserts Pudding. Custard. Fruit gelatin. Ice cream. Fats and Oils Mild salad dressings. Canola or olive oil. The items listed above may not be a complete list of allowed foods or beverages. Contact your dietitian for more options. What foods are not recommended? Foods and ingredients that are often not recommended include:  Spicy foods, such as hot sauce or salsa.  Fried foods.  Sour foods, such as pickled or fermented foods.  Raw vegetables or fruits, especially citrus or berries.  Caffeinated drinks.  Alcohol.  Strongly flavored seasonings or condiments.  The items listed above may not be a complete list of foods and beverages that are not allowed. Contact your dietitian for more information. This information is not intended to replace advice given to you by your health care provider. Make sure you discuss any questions you have with your health care provider. Document Released: 05/18/2015 Document Revised: 07/02/2015 Document Reviewed: 02/05/2014 Elsevier Interactive Patient Education  2018 [employer]  
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
## 2016-10-18 ENCOUNTER — Ambulatory Visit (INDEPENDENT_AMBULATORY_CARE_PROVIDER_SITE_OTHER): Payer: Medicare HMO | Admitting: Family Medicine

## 2016-10-18 ENCOUNTER — Encounter: Payer: Self-pay | Admitting: Family Medicine

## 2016-10-18 VITALS — BP 143/69 | HR 70 | Temp 98.0°F | Resp 16 | Ht 64.0 in | Wt 211.0 lb

## 2016-10-18 DIAGNOSIS — L03119 Cellulitis of unspecified part of limb: Secondary | ICD-10-CM | POA: Diagnosis not present

## 2016-10-18 DIAGNOSIS — M5431 Sciatica, right side: Secondary | ICD-10-CM | POA: Diagnosis not present

## 2016-10-18 DIAGNOSIS — R609 Edema, unspecified: Secondary | ICD-10-CM | POA: Diagnosis not present

## 2016-10-18 MED ORDER — CEPHALEXIN 500 MG PO CAPS: 500.0000 mg | ORAL_CAPSULE | Freq: Two times a day (BID) | ORAL | 0 refills | Status: DC
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
## 2016-10-18 NOTE — Patient Instructions (Addendum)
Start Keflex for possible early leg cellulitis. Compression stockings and elevate legs to help with swelling. Recheck tomorrow with Dr. Tamala Julian to decide if an Louretta Parma boot is needed again.  If you continue to have sciatica symptoms in your leg, I would call your orthopaedic doctor to discuss treatments. Ok to take tylenol for that pain for now. Return to the clinic or go to the nearest emergency room if any of your symptoms worsen or new symptoms occur.   IF you received an x-ray today, you will receive an invoice from [facility]. Please contact [facility] at [phone] with questions or concerns regarding your invoice.   IF you received labwork today, you will receive an invoice from [facility]. Please contact [facility] at [phone] with questions or concerns regarding your invoice.   Our billing staff will not be able to assist you with questions regarding bills from these companies.  You will be contacted with the lab results as soon as they are available. The fastest way to get your results is to activate your My Chart account. Instructions are located on the last page of this paperwork. If you have not heard from [place] regarding the results in 2 weeks, please contact this office.

## 2016-10-19 ENCOUNTER — Encounter: Payer: Self-pay | Admitting: Family Medicine

## 2016-10-19 ENCOUNTER — Ambulatory Visit (INDEPENDENT_AMBULATORY_CARE_PROVIDER_SITE_OTHER): Payer: Medicare HMO | Admitting: Family Medicine

## 2016-10-19 VITALS — BP 138/72 | HR 70 | Temp 97.0°F | Resp 16 | Ht 64.0 in | Wt 211.0 lb

## 2016-10-19 DIAGNOSIS — I872 Venous insufficiency (chronic) (peripheral): Secondary | ICD-10-CM

## 2016-10-19 DIAGNOSIS — L03116 Cellulitis of left lower limb: Secondary | ICD-10-CM | POA: Diagnosis not present

## 2016-10-19 DIAGNOSIS — L03115 Cellulitis of right lower limb: Secondary | ICD-10-CM

## 2016-10-19 DIAGNOSIS — K591 Functional diarrhea: Secondary | ICD-10-CM
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
## 2016-10-19 NOTE — Patient Instructions (Signed)
     IF you received an x-ray today, you will receive an invoice from [facility]. Please contact  Radiology at [phone] with questions or concerns regarding your invoice.   IF you received labwork today, you will receive an invoice from [facility]. Please contact [facility] at [phone] with questions or concerns regarding your invoice.   Our billing staff will not be able to assist you with questions regarding bills from these companies.  You will be contacted with the lab results as soon as they are available. The fastest way to get your results is to activate your My Chart account. Instructions are located on the last page of this paperwork. If you have not heard from us regarding the results in 2 weeks, please contact this office.     
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
## 2016-10-25 ENCOUNTER — Encounter: Payer: Self-pay | Admitting: Family Medicine

## 2016-10-25 ENCOUNTER — Ambulatory Visit (INDEPENDENT_AMBULATORY_CARE_PROVIDER_SITE_OTHER): Payer: Medicare HMO | Admitting: Family Medicine

## 2016-10-25 VITALS — BP 137/75 | HR 70 | Temp 98.0°F | Resp 16 | Ht 64.0 in | Wt 214.0 lb

## 2016-10-25 DIAGNOSIS — I872 Venous insufficiency (chronic) (peripheral): Secondary | ICD-10-CM

## 2016-10-25 DIAGNOSIS — L03116 Cellulitis of left lower limb: Secondary | ICD-10-CM

## 2016-10-25 DIAGNOSIS — L03115 Cellulitis of right lower limb: Secondary | ICD-10-CM

## 2016-10-25 DIAGNOSIS — Z23 Encounter for immunization: Secondary | ICD-10-CM

## 2016-10-25 DIAGNOSIS — K591 Functional diarrhea: Secondary | ICD-10-CM

## 2016-10-25 NOTE — Patient Instructions (Signed)
     IF you received an x-ray today, you will receive an invoice from [facility]. Please contact [facility] at [phone] with questions or concerns regarding your invoice.   IF you received labwork today, you will receive an invoice from [facility]. Please contact [facility] at [phone] with questions or concerns regarding your invoice.   Our billing staff will not be able to assist you with questions regarding bills from these companies.  You will be contacted with the lab results as soon as they are available. The fastest way to get your results is to activate your My Chart account. Instructions are located on the last page of this paperwork. If you have not heard from us regarding the results in 2 weeks, please contact this office.     

## 2016-10-27 ENCOUNTER — Telehealth: Payer: Self-pay | Admitting: Family Medicine

## 2016-10-27 NOTE — Telephone Encounter (Signed)
Patient called concerned about weeping (just moister) above the ankle. Patient was very concerned about the issue and did not want to wait the 48-72 business hour turn around and decided to take an appointment with Dr. Tamala Julian on 10/28/2016 at 4:00 PM

## 2016-10-28 ENCOUNTER — Ambulatory Visit (INDEPENDENT_AMBULATORY_CARE_PROVIDER_SITE_OTHER): Payer: Medicare HMO | Admitting: Family Medicine

## 2016-10-28 ENCOUNTER — Encounter: Payer: Self-pay | Admitting: Family Medicine

## 2016-10-28 VITALS — BP 130/76 | HR 78 | Temp 98.0°F | Resp 16

## 2016-10-28 DIAGNOSIS — R600 Localized edema: Secondary | ICD-10-CM | POA: Diagnosis not present

## 2016-10-28 DIAGNOSIS — I872 Venous insufficiency (chronic) (peripheral): Secondary | ICD-10-CM | POA: Diagnosis not present

## 2016-10-28 DIAGNOSIS — L03115 Cellulitis of right lower limb: Secondary | ICD-10-CM | POA: Diagnosis not present

## 2016-10-28 MED ORDER — CEFTRIAXONE SODIUM 1 G IJ SOLR
1.0000 g | Freq: Once | INTRAMUSCULAR | Status: AC
  Administered 2016-10-28: 1 g via INTRAMUSCULAR

## 2016-10-28 NOTE — Patient Instructions (Signed)
     IF you received an x-ray today, you will receive an invoice from [facility]. Please contact [facility] at [phone] with questions or concerns regarding your invoice.   IF you received labwork today, you will receive an invoice from [facility]. Please contact [facility] at [phone] with questions or concerns regarding your invoice.   Our billing staff will not be able to assist you with questions regarding bills from these companies.  You will be contacted with the lab results as soon as they are available. The fastest way to get your results is to activate your My Chart account. Instructions are located on the last page of this paperwork. If you have not heard from us regarding the results in 2 weeks, please contact this office.     
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
## 2016-11-02 ENCOUNTER — Ambulatory Visit (INDEPENDENT_AMBULATORY_CARE_PROVIDER_SITE_OTHER): Payer: Medicare HMO | Admitting: Family Medicine

## 2016-11-02 ENCOUNTER — Encounter: Payer: Self-pay | Admitting: Family Medicine

## 2016-11-02 VITALS — BP 122/86 | HR 78 | Temp 98.0°F | Resp 16 | Ht 64.0 in | Wt 214.0 lb

## 2016-11-02 DIAGNOSIS — M1611 Unilateral primary osteoarthritis, right hip: Secondary | ICD-10-CM | POA: Diagnosis not present

## 2016-11-02 DIAGNOSIS — S81801A Unspecified open wound, right lower leg, initial encounter: Secondary | ICD-10-CM | POA: Diagnosis not present

## 2016-11-02 DIAGNOSIS — I872 Venous insufficiency (chronic) (peripheral): Secondary | ICD-10-CM | POA: Diagnosis not present

## 2016-11-02 MED ORDER — CEPHALEXIN 500 MG PO CAPS: 500.0000 mg | ORAL_CAPSULE | Freq: Four times a day (QID) | ORAL | 0 refills | Status: DC
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
## 2016-11-02 NOTE — Patient Instructions (Signed)
     IF you received an x-ray today, you will receive an invoice from [facility]. Please contact [facility] at [phone] with questions or concerns regarding your invoice.   IF you received labwork today, you will receive an invoice from [facility]. Please contact [facility] at [phone] with questions or concerns regarding your invoice.   Our billing staff will not be able to assist you with questions regarding bills from these companies.  You will be contacted with the lab results as soon as they are available. The fastest way to get your results is to activate your My Chart account. Instructions are located on the last page of this paperwork. If you have not heard from us regarding the results in 2 weeks, please contact this office.     

## 2016-11-05 ENCOUNTER — Ambulatory Visit: Payer: Medicare HMO | Admitting: Family Medicine
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
## 2016-11-09 ENCOUNTER — Ambulatory Visit: Payer: Medicare HMO | Admitting: Family Medicine

## 2016-11-09 ENCOUNTER — Ambulatory Visit (INDEPENDENT_AMBULATORY_CARE_PROVIDER_SITE_OTHER): Payer: Medicare HMO | Admitting: Family Medicine

## 2016-11-09 ENCOUNTER — Encounter: Payer: Self-pay | Admitting: Family Medicine

## 2016-11-09 VITALS — BP 148/72 | HR 78 | Temp 98.0°F | Resp 16 | Ht 64.0 in | Wt 214.0 lb

## 2016-11-09 DIAGNOSIS — R600 Localized edema: Secondary | ICD-10-CM

## 2016-11-09 DIAGNOSIS — L03116 Cellulitis of left lower limb: Secondary | ICD-10-CM

## 2016-11-09 DIAGNOSIS — I872 Venous insufficiency (chronic) (peripheral): Secondary | ICD-10-CM | POA: Diagnosis not present
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
## 2016-11-09 NOTE — Patient Instructions (Signed)
     IF you received an x-ray today, you will receive an invoice from [facility]. Please contact [facility] at [phone] with questions or concerns regarding your invoice.   IF you received labwork today, you will receive an invoice from [facility]. Please contact [facility] at [phone] with questions or concerns regarding your invoice.   Our billing staff will not be able to assist you with questions regarding bills from these companies.  You will be contacted with the lab results as soon as they are available. The fastest way to get your results is to activate your My Chart account. Instructions are located on the last page of this paperwork. If you have not heard from us regarding the results in 2 weeks, please contact this office.     

## 2016-11-23 ENCOUNTER — Ambulatory Visit (INDEPENDENT_AMBULATORY_CARE_PROVIDER_SITE_OTHER): Payer: Medicare HMO | Admitting: Family Medicine

## 2016-11-23 ENCOUNTER — Encounter: Payer: Self-pay | Admitting: Family Medicine

## 2016-11-23 VITALS — BP 160/70 | HR 76 | Temp 98.0°F | Resp 16 | Wt 198.0 lb

## 2016-11-23 DIAGNOSIS — Z23 Encounter for immunization: Secondary | ICD-10-CM

## 2016-11-23 DIAGNOSIS — L550 Sunburn of first degree: Secondary | ICD-10-CM

## 2016-11-23 DIAGNOSIS — I872 Venous insufficiency (chronic) (peripheral): Secondary | ICD-10-CM

## 2016-11-23 MED ORDER — ZOSTER VAC RECOMB ADJUVANTED 50 MCG/0.5ML IM SUSR: 0.5000 mL | Freq: Once | INTRAMUSCULAR | 1 refills | Status: AC

## 2016-11-23 MED ORDER — ZOSTER VAC RECOMB ADJUVANTED 50 MCG/0.5ML IM SUSR: 0.5000 mL | Freq: Once | INTRAMUSCULAR | 1 refills | Status: DC

## 2016-11-23 NOTE — Patient Instructions (Signed)
     IF you received an x-ray today, you will receive an invoice from [facility]. Please contact [facility] at [phone] with questions or concerns regarding your invoice.   IF you received labwork today, you will receive an invoice from [facility]. Please contact [facility] at [phone] with questions or concerns regarding your invoice.   Our billing staff will not be able to assist you with questions regarding bills from these companies.  You will be contacted with the lab results as soon as they are available. The fastest way to get your results is to activate your My Chart account. Instructions are located on the last page of this paperwork. If you have not heard from us regarding the results in 2 weeks, please contact this office.     
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
## 2016-11-30 DIAGNOSIS — M1611 Unilateral primary osteoarthritis, right hip: Secondary | ICD-10-CM | POA: Diagnosis not present

## 2016-12-09 ENCOUNTER — Ambulatory Visit: Payer: Medicare HMO | Admitting: Family Medicine

## 2016-12-09 DIAGNOSIS — M25551 Pain in right hip: Secondary | ICD-10-CM | POA: Diagnosis not present

## 2016-12-09 DIAGNOSIS — M1611 Unilateral primary osteoarthritis, right hip: Secondary | ICD-10-CM | POA: Diagnosis not present

## 2016-12-10 ENCOUNTER — Telehealth: Payer: Self-pay | Admitting: Family Medicine

## 2016-12-10 NOTE — Telephone Encounter (Signed)
Pt. Called to tell us that she had nose bleed three times last night and twice this morning. She tried to get an appt. But we are filled. I advised that she could go to the urgent care or the ED.   If someone could call her, she would appreciate it   Best callback [phone]

## 2016-12-12 ENCOUNTER — Telehealth: Payer: Self-pay | Admitting: Family Medicine

## 2016-12-12 ENCOUNTER — Ambulatory Visit: Payer: Medicare HMO | Admitting: Family Medicine

## 2016-12-12 NOTE — Telephone Encounter (Signed)
Pt called and left a voicemail on medical records desk asking if we needed anymore ppw for her to be cleared for surgery.  Please advise  [phone]

## 2016-12-12 NOTE — Telephone Encounter (Signed)
Pt states she is better.

## 2016-12-13 NOTE — Telephone Encounter (Signed)
Left detailed voicemail

## 2016-12-13 NOTE — Telephone Encounter (Signed)
Call --- surgical clearance form completed and will be faxed to Dr. Damita Dunnings office in upcoming 1-2 days.

## 2016-12-16 ENCOUNTER — Telehealth: Payer: Self-pay | Admitting: Family Medicine

## 2016-12-16 NOTE — Telephone Encounter (Signed)
Lomotil refill

## 2016-12-16 NOTE — Telephone Encounter (Signed)
Copied from [place] #[id number]. Topic: Quick Communication - See Telephone Encounter >> Dec 16, 2016  8:59 AM Arletha Grippe wrote: CRM for notification. See Telephone encounter for: [facility] request for lomotil.  Pt is needing this to be filled today, she can nor make it through the weekend without. Her pcp is out, and pt wants to make sure someone else can refill this medication.  Please call pt at [phone] (H) if this medication can not be filled today.    12/16/16.

## 2016-12-16 NOTE — Telephone Encounter (Signed)
Patient calling to check the status on this refill. Would like the medicine by tonight if possible.

## 2016-12-20 MED ORDER — DIPHENOXYLATE-ATROPINE 2.5-0.025 MG PO TABS: 1.0000 | ORAL_TABLET | Freq: Four times a day (QID) | ORAL | 0 refills | Status: DC | PRN

## 2016-12-20 NOTE — Telephone Encounter (Signed)
Please call in refill of Lomotil to [facility].  Then call patient to check status. Is she having diarrhea?

## 2016-12-21 NOTE — Telephone Encounter (Signed)
Called Rx into pharmacy.  

## 2016-12-22 ENCOUNTER — Other Ambulatory Visit: Payer: Self-pay | Admitting: Family Medicine

## 2016-12-22 DIAGNOSIS — R197 Diarrhea, unspecified: Secondary | ICD-10-CM

## 2016-12-22 NOTE — Telephone Encounter (Signed)
Rx called in on 11/14 and pt has been informed of instructions.

## 2016-12-22 NOTE — Telephone Encounter (Signed)
Please clarify directions please. Looks like the prescription was changed.

## 2016-12-23 DIAGNOSIS — L03119 Cellulitis of unspecified part of limb: Secondary | ICD-10-CM | POA: Diagnosis not present

## 2016-12-23 DIAGNOSIS — N183 Chronic kidney disease, stage 3 (moderate): Secondary | ICD-10-CM | POA: Diagnosis not present

## 2016-12-23 DIAGNOSIS — Z6835 Body mass index (BMI) 35.0-35.9, adult: Secondary | ICD-10-CM | POA: Diagnosis not present

## 2016-12-23 DIAGNOSIS — M25551 Pain in right hip: Secondary | ICD-10-CM | POA: Diagnosis not present

## 2016-12-23 DIAGNOSIS — I872 Venous insufficiency (chronic) (peripheral): Secondary | ICD-10-CM | POA: Diagnosis not present

## 2016-12-23 DIAGNOSIS — I129 Hypertensive chronic kidney disease with stage 1 through stage 4 chronic kidney disease, or unspecified chronic kidney disease: Secondary | ICD-10-CM | POA: Diagnosis not present

## 2016-12-23 DIAGNOSIS — M150 Primary generalized (osteo)arthritis: Secondary | ICD-10-CM | POA: Diagnosis not present

## 2016-12-23 DIAGNOSIS — M1990 Unspecified osteoarthritis, unspecified site: Secondary | ICD-10-CM | POA: Diagnosis not present

## 2016-12-26 ENCOUNTER — Ambulatory Visit: Payer: Medicare HMO | Admitting: Physician Assistant

## 2016-12-26 ENCOUNTER — Ambulatory Visit: Payer: Medicare HMO | Admitting: Family Medicine

## 2016-12-27 ENCOUNTER — Ambulatory Visit
Admission: RE | Admit: 2016-12-27 | Discharge: 2016-12-27 | Disposition: A | Discharge status: Home / Self Care | Payer: Medicare HMO | Source: Ambulatory Visit | Attending: Family Medicine | Admitting: Family Medicine

## 2016-12-27 DIAGNOSIS — Z1231 Encounter for screening mammogram for malignant neoplasm of breast: Secondary | ICD-10-CM

## 2016-12-27 DIAGNOSIS — E2839 Other primary ovarian failure: Secondary | ICD-10-CM

## 2016-12-27 DIAGNOSIS — Z780 Asymptomatic menopausal state: Secondary | ICD-10-CM | POA: Diagnosis not present

## 2016-12-27 DIAGNOSIS — M810 Age-related osteoporosis without current pathological fracture: Secondary | ICD-10-CM | POA: Diagnosis not present

## 2017-01-02 ENCOUNTER — Encounter: Payer: Self-pay | Admitting: Family Medicine

## 2017-01-02 ENCOUNTER — Other Ambulatory Visit: Payer: Self-pay

## 2017-01-02 ENCOUNTER — Ambulatory Visit (INDEPENDENT_AMBULATORY_CARE_PROVIDER_SITE_OTHER): Payer: Medicare HMO | Admitting: Family Medicine

## 2017-01-02 VITALS — BP 100/60 | HR 72 | Temp 98.6°F | Wt 191.0 lb

## 2017-01-02 DIAGNOSIS — M1611 Unilateral primary osteoarthritis, right hip: Secondary | ICD-10-CM | POA: Diagnosis not present

## 2017-01-02 DIAGNOSIS — I872 Venous insufficiency (chronic) (peripheral): Secondary | ICD-10-CM | POA: Diagnosis not present

## 2017-01-02 DIAGNOSIS — I10 Essential (primary) hypertension: Secondary | ICD-10-CM

## 2017-01-02 DIAGNOSIS — Z01818 Encounter for other preprocedural examination: Secondary | ICD-10-CM

## 2017-01-02 DIAGNOSIS — E7800 Pure hypercholesterolemia, unspecified: Secondary | ICD-10-CM

## 2017-01-02 DIAGNOSIS — R579 Shock, unspecified: Secondary | ICD-10-CM

## 2017-01-02 NOTE — Patient Instructions (Addendum)
Osteoporosis Osteoporosis is the thinning and loss of density in the bones. Osteoporosis makes the bones more brittle, fragile, and likely to break (fracture). Over time, osteoporosis can cause the bones to become so weak that they fracture after a simple fall. The bones most likely to fracture are the bones in the hip, wrist, and spine. What are the causes? The exact cause is not known. What increases the risk? Anyone can develop osteoporosis. You may be at greater risk if you have a family history of the condition or have poor nutrition. You may also have a higher risk if you are:  Female.  5 years old or older.  A smoker.  Not physically active.  White or Asian.  Slender.  What are the signs or symptoms? A fracture might be the first sign of the disease, especially if it results from a fall or injury that would not usually cause a bone to break. Other signs and symptoms include:  Low back and neck pain.  Stooped posture.  Height loss.  How is this diagnosed? To make a diagnosis, your health care provider may:  Take a medical history.  Perform a physical exam.  Order tests, such as: ? A bone mineral density test. ? A dual-energy X-ray absorptiometry test.  How is this treated? The goal of osteoporosis treatment is to strengthen your bones to reduce your risk of a fracture. Treatment may involve:  Making lifestyle changes, such as: ? Eating a diet rich in calcium. ? Doing weight-bearing and muscle-strengthening exercises. ? Stopping tobacco use. ? Limiting alcohol intake.  Taking medicine to slow the process of bone loss or to increase bone density.  Monitoring your levels of calcium and vitamin D.  Follow these instructions at home:  Include calcium and vitamin D in your diet. Calcium is important for bone health, and vitamin D helps the body absorb calcium.  Perform weight-bearing and muscle-strengthening exercises as directed by your health care  provider.  Do not use any tobacco products, including cigarettes, chewing tobacco, and electronic cigarettes. If you need help quitting, ask your health care provider.  Limit your alcohol intake.  Take medicines only as directed by your health care provider.  Keep all follow-up visits as directed by your health care provider. This is important.  Take precautions at home to lower your risk of falling, such as: ? Keeping rooms well lit and clutter free. ? Installing safety rails on stairs. ? Using rubber mats in the bathroom and other areas that are often wet or slippery. Get help right away if: You fall or injure yourself. This information is not intended to replace advice given to you by your health care provider. Make sure you discuss any questions you have with your health care provider. Document Released: 11/03/2004 Document Revised: 06/29/2015 Document Reviewed: 07/04/2013 Elsevier Interactive Patient Education  2017 [place] blood pressure at home daily for two weeks.     IF you received an x-ray today, you will receive an invoice from [facility]. Please contact [facility] at [phone] with questions or concerns regarding your invoice.   IF you received labwork today, you will receive an invoice from [facility]. Please contact [facility] at [phone] with questions or concerns regarding your invoice.   Our billing staff will not be able to assist you with questions regarding bills from these companies.  You will be contacted with the lab results as soon as they are available. The fastest way to get your results is  to activate your My Chart account. Instructions are located on the last page of this paperwork. If you have not heard from [place] regarding the results in 2 weeks, please contact this office.
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
## 2017-01-03 LAB — CBC WITH DIFFERENTIAL/PLATELET
BASOS ABS: 0 10*3/uL (ref 0.0–0.2)
Basos: 0 %
EOS (ABSOLUTE): 0.2 10*3/uL (ref 0.0–0.4)
Eos: 4 %
HEMOGLOBIN: 9.7 g/dL — AB (ref 11.1–15.9)
Hematocrit: 29.9 % — ABNORMAL LOW (ref 34.0–46.6)
IMMATURE GRANS (ABS): 0 10*3/uL (ref 0.0–0.1)
Immature Granulocytes: 0 %
LYMPHS: 19 %
Lymphocytes Absolute: 0.9 10*3/uL (ref 0.7–3.1)
MCH: 29.3 pg (ref 26.6–33.0)
MCHC: 32.4 g/dL (ref 31.5–35.7)
MCV: 90 fL (ref 79–97)
MONOCYTES: 9 %
Monocytes Absolute: 0.4 10*3/uL (ref 0.1–0.9)
NEUTROS ABS: 3.2 10*3/uL (ref 1.4–7.0)
Neutrophils: 68 %
Platelets: 191 10*3/uL (ref 150–379)
RBC: 3.31 x10E6/uL — ABNORMAL LOW (ref 3.77–5.28)
RDW: 15.3 % (ref 12.3–15.4)
WBC: 4.7 10*3/uL (ref 3.4–10.8)

## 2017-01-03 LAB — LIPID PANEL
CHOL/HDL RATIO: 3.2 ratio (ref 0.0–4.4)
Cholesterol, Total: 233 mg/dL — ABNORMAL HIGH (ref 100–199)
HDL: 73 mg/dL (ref >39)
LDL CALC: 135 mg/dL — AB (ref 0–99)
Triglycerides: 125 mg/dL (ref 0–149)
VLDL CHOLESTEROL CAL: 25 mg/dL (ref 5–40)

## 2017-01-03 LAB — COMPREHENSIVE METABOLIC PANEL
ALT: 7 IU/L (ref 0–32)
AST: 16 IU/L (ref 0–40)
Albumin/Globulin Ratio: 2.4 — ABNORMAL HIGH (ref 1.2–2.2)
Albumin: 4.6 g/dL (ref 3.5–4.7)
Alkaline Phosphatase: 119 IU/L — ABNORMAL HIGH (ref 39–117)
BILIRUBIN TOTAL: 0.3 mg/dL (ref 0.0–1.2)
BUN / CREAT RATIO: 22 (ref 12–28)
BUN: 24 mg/dL (ref 8–27)
CO2: 18 mmol/L — AB (ref 20–29)
CREATININE: 1.08 mg/dL — AB (ref 0.57–1.00)
Calcium: 9.5 mg/dL (ref 8.7–10.3)
Chloride: 103 mmol/L (ref 96–106)
GFR, EST AFRICAN AMERICAN: 55 mL/min/{1.73_m2} — AB (ref >59)
GFR, EST NON AFRICAN AMERICAN: 48 mL/min/{1.73_m2} — AB (ref >59)
GLUCOSE: 105 mg/dL — AB (ref 65–99)
Globulin, Total: 1.9 g/dL (ref 1.5–4.5)
Potassium: 4.5 mmol/L (ref 3.5–5.2)
Sodium: 139 mmol/L (ref 134–144)
TOTAL PROTEIN: 6.5 g/dL (ref 6.0–8.5)

## 2017-01-05 MED ORDER — DIPYRIDAMOLE 75 MG PO TABS: 75.0000 mg | ORAL_TABLET | Freq: Two times a day (BID) | ORAL | 1 refills | Status: AC

## 2017-01-09 DIAGNOSIS — I359 Nonrheumatic aortic valve disorder, unspecified: Secondary | ICD-10-CM | POA: Diagnosis not present

## 2017-01-09 DIAGNOSIS — I351 Nonrheumatic aortic (valve) insufficiency: Secondary | ICD-10-CM | POA: Diagnosis not present

## 2017-01-09 DIAGNOSIS — I119 Hypertensive heart disease without heart failure: Secondary | ICD-10-CM | POA: Diagnosis not present

## 2017-01-09 DIAGNOSIS — E785 Hyperlipidemia, unspecified: Secondary | ICD-10-CM | POA: Diagnosis not present

## 2017-01-09 DIAGNOSIS — E668 Other obesity: Secondary | ICD-10-CM | POA: Diagnosis not present

## 2017-01-09 DIAGNOSIS — I517 Cardiomegaly: Secondary | ICD-10-CM | POA: Diagnosis not present

## 2017-01-09 DIAGNOSIS — Z01810 Encounter for preprocedural cardiovascular examination: Secondary | ICD-10-CM | POA: Diagnosis not present

## 2017-02-01 ENCOUNTER — Ambulatory Visit: Payer: Medicare HMO | Admitting: Family Medicine

## 2017-02-08 ENCOUNTER — Encounter: Payer: Self-pay | Admitting: Physician Assistant

## 2017-02-08 ENCOUNTER — Other Ambulatory Visit: Payer: Self-pay

## 2017-02-08 ENCOUNTER — Ambulatory Visit (INDEPENDENT_AMBULATORY_CARE_PROVIDER_SITE_OTHER): Payer: Medicare HMO | Admitting: Physician Assistant

## 2017-02-08 ENCOUNTER — Ambulatory Visit: Payer: Medicare HMO | Admitting: Physician Assistant

## 2017-02-08 VITALS — BP 133/59 | HR 65 | Temp 98.3°F | Wt 207.0 lb

## 2017-02-08 DIAGNOSIS — R82998 Other abnormal findings in urine: Secondary | ICD-10-CM

## 2017-02-08 DIAGNOSIS — L539 Erythematous condition, unspecified: Secondary | ICD-10-CM

## 2017-02-08 DIAGNOSIS — E860 Dehydration: Secondary | ICD-10-CM | POA: Diagnosis not present

## 2017-02-08 DIAGNOSIS — R300 Dysuria: Secondary | ICD-10-CM | POA: Diagnosis not present

## 2017-02-08 DIAGNOSIS — R682 Dry mouth, unspecified: Secondary | ICD-10-CM | POA: Diagnosis not present

## 2017-02-08 DIAGNOSIS — I951 Orthostatic hypotension: Secondary | ICD-10-CM | POA: Diagnosis not present

## 2017-02-08 LAB — POCT URINALYSIS DIP (MANUAL ENTRY)
BILIRUBIN UA: NEGATIVE mg/dL
Bilirubin, UA: NEGATIVE
Glucose, UA: NEGATIVE mg/dL
NITRITE UA: NEGATIVE
PH UA: 7 (ref 5.0–8.0)
PROTEIN UA: NEGATIVE mg/dL
Spec Grav, UA: 1.015 (ref 1.010–1.025)
Urobilinogen, UA: 0.2 E.U./dL

## 2017-02-08 MED ORDER — CEPHALEXIN 500 MG PO CAPS: 500.0000 mg | ORAL_CAPSULE | Freq: Two times a day (BID) | ORAL | 0 refills | Status: AC

## 2017-02-08 NOTE — Progress Notes (Signed)
02/10/2017 at 1:30 PM  Chelsea Browning / DOB: [birth year] / MRN: [id number]  The patient has Stasis dermatitis of both legs; Anemia; Obesity; Arthritis; Depression with anxiety; HLD (hyperlipidemia); Hiatal hernia with gastroesophageal reflux; HTN (hypertension); Pedal edema; Hyponatremia; Carotid artery disease ([place]); H/O Clostridium difficile infection; H/O skin graft; Hypogammaglobulinemia ([place]); Hip osteoarthritis; and Urge and stress incontinence on their problem list.  SUBJECTIVE  Chelsea Browning is a 84 y.o. female who complains of dysuria, urinary frequency and suprapubic pressure  x 1 week.  She denies hematuria, urinary urgency, flank pain, pelvic pain and vaginal discharge. Has not tried anything for relief. Most recent UTI prior to this was about half a year ago.   In terms of low blood pressure, she notes she has not been drinking much water lately. Only drank coffee today. Has dizziness sometimes in the morning, but this is not a daily thing. Today, she denies dizziness, chest pain, heart palpitiatons, shortness of breath, nausea, and vomiting.   Pt is followed closely by Dr. Chalmers Cater, cardiology, just saw him two weeks ago and had echo, which was normal.   Of note, pt also has venous stasis and reccurrent cellulitis in bilateral lower legs. She denies any worsening of leg swelling. Does have some mild erythema. Notes she knows when the cellulitis is about to flare up and she does not think it is to that point yet. Denies lower leg pain and warmth.    She  has a past medical history of Anemia, Anxiety, Arthritis, Blood transfusion without reported diagnosis, Carotid artery disease ([place]) (07/24/2013), Cataract, Cellulitis, Depression with anxiety, Esophageal reflux (07/24/2013), H/O Clostridium difficile infection (10/07/2014), Hiatal hernia with gastroesophageal reflux (07/24/2013), HLD (hyperlipidemia), HTN (hypertension) (07/24/2013), Hyponatremia, Obesity, unspecified (07/24/2013), Pedal  edema (07/24/2013), and Urinary incontinence.    Medications reviewed and updated by myself where necessary, and exist elsewhere in the encounter.   Chelsea Browning is allergic to augmentin [amoxicillin-pot clavulanate] and doxycycline. She  reports that  has never smoked. she has never used smokeless tobacco. She reports that she drinks alcohol. She reports that she does not use drugs. She  reports that she does not engage in sexual activity. The patient  has a past surgical history that includes Appendectomy; Cosmetic surgery; Tracheostomy; Tracheostomy closure; Jejunostomy feeding tube; esophageal hernia surgery; Skin graft; and Tonsillectomy.  Her family history includes Cancer in her sister; Coronary artery disease in her father; Depression in her daughter, maternal grandmother, and sister; Diabetes in her mother; Heart disease in her father; Hyperlipidemia in her daughter, daughter, and sister; Hypertension in her father; Kidney disease in her sister; Obesity in her sister; Stroke in her daughter and mother.  Review of Systems  Constitutional: Negative for chills, diaphoresis and fever.  Gastrointestinal: Negative for nausea and vomiting.    OBJECTIVE  Her  weight is 207 lb (93.9 kg). Her oral temperature is 98.3 F (36.8 C). Her blood pressure is 133/59 (abnormal) and her pulse is 65.  The patient's body mass index is 35.53 kg/m.  Physical Exam  Constitutional: She is oriented to person, place, and time. She appears well-developed and well-nourished. No distress.  HENT:  Head: Normocephalic and atraumatic.  Mouth/Throat: Uvula is midline. Mucous membranes are dry.  Eyes: Conjunctivae are normal.  Neck: Normal range of motion.  Cardiovascular: Normal rate, regular rhythm and normal heart sounds.  Pulmonary/Chest: Effort normal and breath sounds normal. She has no wheezes. She has no rhonchi. She has no rales.  Abdominal: Soft. Normal appearance and  bowel sounds are normal. There is no  tenderness. There is no CVA tenderness.  Musculoskeletal:       Right lower leg: She exhibits edema.       Left lower leg: She exhibits edema.  Mild erythema noted in bilateral lower extremities. No overlying warmth or tenderness palpated.   Neurological: She is alert and oriented to person, place, and time.  Skin: Skin is warm and dry.  Psychiatric: She has a normal mood and affect.  Vitals reviewed.   No results found for this or any previous visit (from the past 24 hour(s)). No data found.  BP Readings from Last 3 Encounters:  02/08/17 (!) 133/59  01/02/17 100/60  11/23/16 (!) 160/70   Wt Readings from Last 3 Encounters:  02/08/17 207 lb (93.9 kg)  01/02/17 191 lb (86.6 kg)  11/23/16 198 lb (89.8 kg)     ASSESSMENT & PLAN  Chelsea Browning was seen today for dysuria and urinary frequency.  Diagnoses and all orders for this visit:  Dysuria -     POCT urinalysis dipstick -     Urine Culture Leukocytes in urine Patient's symptoms and UA suspicious for UTI.  Will treat accordingly at this time with Keflex, as this will also cover for any underlying beginning cellulitis of bilateral lower extremities.  Urine culture pending. -     cephALEXin (KEFLEX) 500 MG capsule; Take 1 capsule (500 mg total) by mouth 2 (two) times daily for 7 days.  Leg erythema -     cephALEXin (KEFLEX) 500 MG capsule; Take 1 capsule (500 mg total) by mouth 2 (two) times daily for 7 days.  Orthostatic hypotension Likely due to dehydration.  She is overall well-appearing.  Asymptomatic.  Patient was able to drink two 8 ounce glasses of water in office.  Blood pressure increased appropriately.  Patient encouraged to  increase fluid consumption at home.  Continue monitoring blood pressure at home.  Goal is >100/60 and <140/90.  Informed that if her bp drops below 100/60, stop taking amlodipine and contact our office.  She has a follow-up appointment with Dr. Tamala Julian in 5 days.  Advised to return sooner if any of her  symptoms worsen or she develops new concerning symptoms. Dry mouth Dehydration  The patient was advised to call or come back to clinic if she does not see an improvement in symptoms, or worsens with the above plan.   Chelsea Delaine, PA-C  Primary Care at [facility] 02/10/2017 1:34 PM

## 2017-02-08 NOTE — Patient Instructions (Addendum)
Your results indicate you have a UTI. I have given you a prescription for an antibiotic. This should cover both a UTI and any underlying beginning cellulitis.  Please take with food. I have sent off a urine culture and we should have those results in 48 hours. If your symptoms worsen while you are awaiting these results or you develop fever, chills, flank pian, nausea and vomiting, please seek care immediately.   In the meantime, your blood pressure was a little low today. Please make sure you are drinking enough water every day. Check your blood pressure at home. Your goal is >100/60 and <140/90. If you drop below 100/60, stop taking amlodipine. Follow up with Dr. Tamala Julian in 5 days as planned. Return sooner if any of your symptoms worsen. Thank you for letting me participate in your health and well being.  Urinary Tract Infection, Adult A urinary tract infection (UTI) is an infection of any part of the urinary tract. The urinary tract includes the:  Kidneys.  Ureters.  Bladder.  Urethra.  These organs make, store, and get rid of pee (urine) in the body. Follow these instructions at home:  Take over-the-counter and prescription medicines only as told by your doctor.  If you were prescribed an antibiotic medicine, take it as told by your doctor. Do not stop taking the antibiotic even if you start to feel better.  Avoid the following drinks: ? Alcohol. ? Caffeine. ? Tea. ? Carbonated drinks.  Drink enough fluid to keep your pee clear or pale yellow.  Keep all follow-up visits as told by your doctor. This is important.  Make sure to: ? Empty your bladder often and completely. Do not to hold pee for long periods of time. ? Empty your bladder before and after sex. ? Wipe from front to back after a bowel movement if you are female. Use each tissue one time when you wipe. Contact a doctor if:  You have back pain.  You have a fever.  You feel sick to your stomach (nauseous).  You  throw up (vomit).  Your symptoms do not get better after 3 days.  Your symptoms go away and then come back. Get help right away if:  You have very bad back pain.  You have very bad lower belly (abdominal) pain.  You are throwing up and cannot keep down any medicines or water. This information is not intended to replace advice given to you by your health care provider. Make sure you discuss any questions you have with your health care provider. Document Released: 07/13/2007 Document Revised: 07/02/2015 Document Reviewed: 12/15/2014 Elsevier Interactive Patient Education  [employer].

## 2017-02-10 ENCOUNTER — Other Ambulatory Visit: Payer: Self-pay | Admitting: Family Medicine

## 2017-02-10 DIAGNOSIS — R579 Shock, unspecified: Secondary | ICD-10-CM

## 2017-02-10 LAB — URINE CULTURE

## 2017-02-10 NOTE — Telephone Encounter (Signed)
See attached refill for Persantine.

## 2017-02-13 ENCOUNTER — Ambulatory Visit: Payer: Medicare HMO | Admitting: Family Medicine

## 2017-02-14 ENCOUNTER — Encounter: Payer: Self-pay | Admitting: Physician Assistant

## 2017-02-14 ENCOUNTER — Telehealth: Payer: Self-pay | Admitting: *Deleted

## 2017-02-14 ENCOUNTER — Other Ambulatory Visit: Payer: Self-pay

## 2017-02-14 ENCOUNTER — Ambulatory Visit (INDEPENDENT_AMBULATORY_CARE_PROVIDER_SITE_OTHER): Payer: Medicare HMO | Admitting: Physician Assistant

## 2017-02-14 VITALS — BP 125/70 | HR 71 | Temp 98.8°F | Resp 16 | Wt 205.6 lb

## 2017-02-14 DIAGNOSIS — R319 Hematuria, unspecified: Secondary | ICD-10-CM | POA: Diagnosis not present

## 2017-02-14 DIAGNOSIS — R82998 Other abnormal findings in urine: Secondary | ICD-10-CM

## 2017-02-14 DIAGNOSIS — N390 Urinary tract infection, site not specified: Secondary | ICD-10-CM | POA: Diagnosis not present

## 2017-02-14 LAB — POCT URINALYSIS DIP (MANUAL ENTRY)
Bilirubin, UA: NEGATIVE
Glucose, UA: NEGATIVE mg/dL
Ketones, POC UA: NEGATIVE mg/dL
NITRITE UA: NEGATIVE
PH UA: 5.5 (ref 5.0–8.0)
RBC UA: NEGATIVE
Spec Grav, UA: 1.02 (ref 1.010–1.025)
Urobilinogen, UA: 0.2 E.U./dL

## 2017-02-14 LAB — POC MICROSCOPIC URINALYSIS (UMFC): Mucus: ABSENT

## 2017-02-14 NOTE — Telephone Encounter (Signed)
Please inform patient that if she is still having urinary symptoms she can keep her appointment today with Timmothy Euler, but if she needs to be seen for her chronic medical problems she will need to reschedule today and make an appointment with Dr. Tamala Julian.

## 2017-02-14 NOTE — Progress Notes (Signed)
Chelsea Browning  MRN: [id number] DOB: Jan 11, 1933  Subjective:  Chelsea Browning is a 84 y.o. female seen in office today for a chief complaint of follow-up on UTI.  Patient was initially seen on 02/08/17.  Urine dip with trace blood and large leukocytes.  Urine culture grew Klebsiella pneumonia, which was susceptible to Keflex, which she was placed on.  She is taking the antibiotic as prescribed.  She notes that her dysuria and suprapubic pressure have resolved. Notes it took about 4 days of being on the abx for symptoms to resolve. She has not yet completed her keflex course, still has a couple days left.  Denies urinary frequency, urinary urgency, hematuria, flank pain, nausea, vomiting, fever, chills, and diaphoresis.  In terms of lower legs, patient denies any worsening redness.  Denies any lower extremity pain and warmth.  Has no other questions or concerns today.  Review of Systems Per HPI Patient Active Problem List   Diagnosis Date Noted  . Hypogammaglobulinemia ([place]) 08/20/2015  . H/O skin graft 06/23/2015  . H/O Clostridium difficile infection 10/07/2014  . Hip osteoarthritis 08/13/2013  . Obesity 07/24/2013  . Hiatal hernia with gastroesophageal reflux 07/24/2013  . HTN (hypertension) 07/24/2013  . Pedal edema 07/24/2013  . Carotid artery disease ([place]) 07/24/2013  . Arthritis   . Depression with anxiety   . HLD (hyperlipidemia)   . Hyponatremia   . Urge and stress incontinence 06/25/2013  . Anemia 01/30/2013  . Stasis dermatitis of both legs 01/21/2013    Current Outpatient Medications on File Prior to Visit  Medication Sig Dispense Refill  . acetaminophen (TYLENOL) 500 MG tablet Take 1,000 mg by mouth every 6 (six) hours as needed for moderate pain.    Marland Kitchen amLODipine (NORVASC) 5 MG tablet Take 5 mg by mouth daily.    Marland Kitchen atorvastatin (LIPITOR) 40 MG tablet Take 1 tablet (40 mg total) by mouth daily. 90 tablet 3  . cephALEXin (KEFLEX) 500 MG capsule Take 1 capsule (500 mg  total) by mouth 2 (two) times daily for 7 days. 14 capsule 0  . diphenoxylate-atropine (LOMOTIL) 2.5-0.025 MG tablet TAKE 1 TABLET 3 TIMES DAILY AS NEEDED FOR DIARRHEA OR LOOSE STOOLS. 15 tablet 0  . dipyridamole (PERSANTINE) 75 MG tablet Take 1 tablet (75 mg total) by mouth 2 (two) times daily. 180 tablet 1  . HYDROcodone-acetaminophen (NORCO) 5-325 MG tablet Take 1 tablet by mouth every 6 (six) hours as needed. 30 tablet 0  . labetalol (NORMODYNE) 200 MG tablet Take 1 tablet (200 mg total) by mouth 3 (three) times daily. 270 tablet 3  . losartan (COZAAR) 50 MG tablet Take 1 tablet (50 mg total) by mouth daily. 90 tablet 3  . omeprazole (PRILOSEC) 20 MG capsule TAKE 1 CAPSULE ONCE DAILY BEFORE BREAKFAST. 90 capsule 3  . spironolactone (ALDACTONE) 25 MG tablet Take 25 mg by mouth daily.    Marland Kitchen triamcinolone cream (KENALOG) 0.1 % APPLY TO AFFECTED AREAS ON LEGS ONCE OR TWICE DAILY. 240 g 3   No current facility-administered medications on file prior to visit.     Allergies  Allergen Reactions  . Augmentin [Amoxicillin-Pot Clavulanate] Hives  . Doxycycline Rash     Objective:  BP 125/70 (BP Location: Left Arm, Patient Position: Sitting, Cuff Size: Large)   Pulse 71   Temp 98.8 F (37.1 C) (Oral)   Resp 16   Wt 205 lb 9.6 oz (93.3 kg)   SpO2 99%   BMI 35.29 kg/m   Physical Exam  Constitutional: She is oriented to person, place, and time and well-developed, well-nourished, and in no distress.  HENT:  Head: Normocephalic and atraumatic.  Eyes: Conjunctivae are normal.  Neck: Normal range of motion.  Pulmonary/Chest: Effort normal.  Musculoskeletal:       Right lower leg: She exhibits edema.       Left lower leg: She exhibits edema.  Mild erythema noted in bilateral lower extremities. No overlying warmth or tenderness palpated.    Neurological: She is alert and oriented to person, place, and time. Gait normal.  Skin: Skin is warm and dry.  Psychiatric: Affect normal.  Vitals  reviewed.    Results for orders placed or performed in visit on 02/14/17 (from the past 24 hour(s))  POCT urinalysis dipstick     Status: Abnormal   Collection Time: 02/14/17 10:55 AM  Result Value Ref Range   Color, UA yellow yellow   Clarity, UA cloudy (A) clear   Glucose, UA negative negative mg/dL   Bilirubin, UA negative negative   Ketones, POC UA negative negative mg/dL   Spec Grav, UA 1.020 1.010 - 1.025   Blood, UA negative negative   pH, UA 5.5 5.0 - 8.0   Protein Ur, POC =30 (A) negative mg/dL   Urobilinogen, UA 0.2 0.2 or 1.0 E.U./dL   Nitrite, UA Negative Negative   Leukocytes, UA Moderate (2+) (A) Negative  POCT Microscopic Urinalysis ([facility])     Status: Abnormal   Collection Time: 02/14/17 11:22 AM  Result Value Ref Range   WBC,UR,HPF,POC Too numerous to count  (A) None WBC/hpf   RBC,UR,HPF,POC None None RBC/hpf   Bacteria Many (A) None, Too numerous to count   Mucus Absent Absent   Epithelial Cells, UR Per Microscopy Few (A) None, Too numerous to count cells/hpf     Assessment and Plan :  1. Urinary tract infection with hematuria, site unspecified Patient symptoms have resolved but it did take about 4 days of antibiotics for this to occur.  She is currently asymptomatic.  Hematuria has resolved.  UA dipstick with moderate leukocytes and urine micro with too numerous to count white blood cells and few epithelial cells. Will resend for culture at this time to ensure UTI has resolved.  Will contact patient with results.  Advised patient that in the meantime if she develops any new urinary or other concerning symptoms to contact our office immediately. - POCT urinalysis dipstick - POCT Microscopic Urinalysis ([facility])  2. Leukocytes in urine - Urine Culture  Tenna Delaine PA-C  [facility] 02/14/2017 11:24 AM

## 2017-02-14 NOTE — Patient Instructions (Addendum)
We are going to reculture your urine. In the meantime, make sure you finish the keflex you are taking. If you develop any new symptoms, please contact our office. Thank you for letting me participate in your health and well being.     IF you received an x-ray today, you will receive an invoice from [facility]. Please contact [facility] at [phone] with questions or concerns regarding your invoice.   IF you received labwork today, you will receive an invoice from [place]. Please contact [facility] at [phone] with questions or concerns regarding your invoice.   Our billing staff will not be able to assist you with questions regarding bills from these companies.  You will be contacted with the lab results as soon as they are available. The fastest way to get your results is to activate your My Chart account. Instructions are located on the last page of this paperwork. If you have not heard from [place] regarding the results in 2 weeks, please contact this office.

## 2017-02-15 ENCOUNTER — Telehealth: Payer: Self-pay | Admitting: Family Medicine

## 2017-02-15 LAB — URINE CULTURE: Organism ID, Bacteria: NO GROWTH

## 2017-02-15 NOTE — Telephone Encounter (Signed)
Copied from [place] [id number]. Topic: Quick Communication - Other Results >> Feb 15, 2017  1:29 PM Antonieta Iba C wrote:   Pt called in to request the results of her UA.   Please assist further.

## 2017-02-16 ENCOUNTER — Telehealth: Payer: Self-pay | Admitting: Physician Assistant

## 2017-02-16 NOTE — Telephone Encounter (Signed)
Informed pt of labs, she verbalized understanding.  

## 2017-02-16 NOTE — Telephone Encounter (Signed)
Patient called to say she is supposed to have a script sent for her UTI to [facility] on [address], please advise.

## 2017-02-16 NOTE — Telephone Encounter (Signed)
Copied from [place] [id number]. Topic: Inquiry >> Feb 16, 2017  1:05 PM Cecelia Byars, NT wrote: Reason for CRM: Patient called and said that she suppose to have a prescription for her UTI sent to  [facility] on [address] again ,due to her still having the infection  please advise [phone]

## 2017-02-17 NOTE — Telephone Encounter (Signed)
Very upset this hasn't been done, please advise

## 2017-02-17 NOTE — Progress Notes (Signed)
Pt advised that second urine culture was negative.  No add'l abx needed.  Pt has no further questions at this time.

## 2017-02-17 NOTE — Progress Notes (Signed)
Pt advised that second urine culture is negative.  No add'l abx needed.  Pt denies further questions at this time.

## 2017-02-17 NOTE — Telephone Encounter (Signed)
Pt advised that second urine culture was negative.  No need for add'l antibiotics.  Pt denies further questions at this time.

## 2017-02-17 NOTE — Telephone Encounter (Signed)
Sending message to [place] re: UTI abx????

## 2017-02-27 DIAGNOSIS — G5602 Carpal tunnel syndrome, left upper limb: Secondary | ICD-10-CM | POA: Diagnosis not present

## 2017-02-27 DIAGNOSIS — R200 Anesthesia of skin: Secondary | ICD-10-CM | POA: Diagnosis not present

## 2017-02-28 DIAGNOSIS — M1611 Unilateral primary osteoarthritis, right hip: Secondary | ICD-10-CM | POA: Diagnosis not present

## 2017-03-06 ENCOUNTER — Ambulatory Visit: Payer: Medicare HMO | Admitting: Physician Assistant

## 2017-03-07 DIAGNOSIS — E669 Obesity, unspecified: Secondary | ICD-10-CM | POA: Diagnosis not present

## 2017-03-07 DIAGNOSIS — R197 Diarrhea, unspecified: Secondary | ICD-10-CM | POA: Diagnosis not present

## 2017-03-07 DIAGNOSIS — Z7902 Long term (current) use of antithrombotics/antiplatelets: Secondary | ICD-10-CM | POA: Diagnosis not present

## 2017-03-07 DIAGNOSIS — I10 Essential (primary) hypertension: Secondary | ICD-10-CM | POA: Diagnosis not present

## 2017-03-07 DIAGNOSIS — E785 Hyperlipidemia, unspecified: Secondary | ICD-10-CM | POA: Diagnosis not present

## 2017-03-07 DIAGNOSIS — L309 Dermatitis, unspecified: Secondary | ICD-10-CM | POA: Diagnosis not present

## 2017-03-07 DIAGNOSIS — R32 Unspecified urinary incontinence: Secondary | ICD-10-CM | POA: Diagnosis not present

## 2017-03-07 DIAGNOSIS — I82509 Chronic embolism and thrombosis of unspecified deep veins of unspecified lower extremity: Secondary | ICD-10-CM | POA: Diagnosis not present

## 2017-03-07 DIAGNOSIS — G8929 Other chronic pain: Secondary | ICD-10-CM | POA: Diagnosis not present

## 2017-03-07 DIAGNOSIS — K219 Gastro-esophageal reflux disease without esophagitis: Secondary | ICD-10-CM | POA: Diagnosis not present

## 2017-03-08 ENCOUNTER — Other Ambulatory Visit: Payer: Self-pay

## 2017-03-08 ENCOUNTER — Ambulatory Visit (INDEPENDENT_AMBULATORY_CARE_PROVIDER_SITE_OTHER): Payer: Medicare HMO | Admitting: Physician Assistant

## 2017-03-08 ENCOUNTER — Encounter: Payer: Self-pay | Admitting: Physician Assistant

## 2017-03-08 VITALS — BP 132/72 | HR 72 | Temp 97.9°F | Resp 18 | Ht 64.0 in | Wt 208.0 lb

## 2017-03-08 DIAGNOSIS — I872 Venous insufficiency (chronic) (peripheral): Secondary | ICD-10-CM

## 2017-03-08 DIAGNOSIS — F411 Generalized anxiety disorder: Secondary | ICD-10-CM | POA: Diagnosis not present

## 2017-03-08 DIAGNOSIS — E7800 Pure hypercholesterolemia, unspecified: Secondary | ICD-10-CM | POA: Diagnosis not present

## 2017-03-08 DIAGNOSIS — R944 Abnormal results of kidney function studies: Secondary | ICD-10-CM

## 2017-03-08 DIAGNOSIS — R69 Illness, unspecified: Secondary | ICD-10-CM | POA: Diagnosis not present

## 2017-03-08 NOTE — Patient Instructions (Addendum)
Your legs look great today.  I recommend continuing with compression stockings as you are.  In terms of labs, I recommend coming in next week while you are fasting before your visit with Dr. Tamala Julian to have repeat lipid panel and CMP.  Make sure you are drinking enough water before this visit as your kidney function was slightly decreased at your last visit.  If you have any other questions or you develop new concerning symptoms, please seek care immediately.   IF you received an x-ray today, you will receive an invoice from [facility]. Please contact [facility] at [phone] with questions or concerns regarding your invoice.   IF you received labwork today, you will receive an invoice from [place]. Please contact [facility] at [phone] with questions or concerns regarding your invoice.   Our billing staff will not be able to assist you with questions regarding bills from these companies.  You will be contacted with the lab results as soon as they are available. The fastest way to get your results is to activate your My Chart account. Instructions are located on the last page of this paperwork. If you have not heard from [place] regarding the results in 2 weeks, please contact this office.

## 2017-03-08 NOTE — Progress Notes (Signed)
Chelsea Browning  MRN: [id number] DOB: 08-11-1932  Subjective:  Chelsea Browning is a 84 y.o. female seen in office today for a chief complaint of questions about lab work and lower leg issues.  She was wondering what her last cholesterol was.  She cannot remember whenever she had her last panel and is wondering if she needs to have it completed today.  In terms of lower leg swelling, she has a history of chronic venous stasis and cellulitis, requiring multiple rounds of antibiotics and [place] boots in the past.  Today, she notes that her legs are the best they have ever looked.  She has been wearing her compression stockings as instructed.  She denies redness, warmth, pain, and increased swelling.,  Diaphoresis, chest pain, shortness of breath, and orthopnea.  She is also concerned because she has been told that her primary care doctor is leaving.  This is made her very anxious and she is wondering if she needs to find a new primary care doctor at this time.  Review of Systems  Per HPI  Patient Active Problem List   Diagnosis Date Noted  . Hypogammaglobulinemia ([place]) 08/20/2015  . H/O skin graft 06/23/2015  . H/O Clostridium difficile infection 10/07/2014  . Hip osteoarthritis 08/13/2013  . Obesity 07/24/2013  . Hiatal hernia with gastroesophageal reflux 07/24/2013  . HTN (hypertension) 07/24/2013  . Pedal edema 07/24/2013  . Carotid artery disease ([place]) 07/24/2013  . Arthritis   . Depression with anxiety   . HLD (hyperlipidemia)   . Hyponatremia   . Urge and stress incontinence 06/25/2013  . Anemia 01/30/2013  . Stasis dermatitis of both legs 01/21/2013    Current Outpatient Medications on File Prior to Visit  Medication Sig Dispense Refill  . acetaminophen (TYLENOL) 500 MG tablet Take 1,000 mg by mouth every 6 (six) hours as needed for moderate pain.    Marland Kitchen amLODipine (NORVASC) 5 MG tablet Take 5 mg by mouth daily.    Marland Kitchen atorvastatin (LIPITOR) 40 MG tablet Take 1 tablet (40 mg total)  by mouth daily. 90 tablet 3  . diphenoxylate-atropine (LOMOTIL) 2.5-0.025 MG tablet TAKE 1 TABLET 3 TIMES DAILY AS NEEDED FOR DIARRHEA OR LOOSE STOOLS. 15 tablet 0  . dipyridamole (PERSANTINE) 75 MG tablet Take 1 tablet (75 mg total) by mouth 2 (two) times daily. 180 tablet 1  . HYDROcodone-acetaminophen (NORCO) 5-325 MG tablet Take 1 tablet by mouth every 6 (six) hours as needed. 30 tablet 0  . labetalol (NORMODYNE) 200 MG tablet Take 1 tablet (200 mg total) by mouth 3 (three) times daily. 270 tablet 3  . losartan (COZAAR) 50 MG tablet Take 1 tablet (50 mg total) by mouth daily. 90 tablet 3  . omeprazole (PRILOSEC) 20 MG capsule TAKE 1 CAPSULE ONCE DAILY BEFORE BREAKFAST. 90 capsule 3  . spironolactone (ALDACTONE) 25 MG tablet Take 25 mg by mouth daily.    Marland Kitchen triamcinolone cream (KENALOG) 0.1 % APPLY TO AFFECTED AREAS ON LEGS ONCE OR TWICE DAILY. 240 g 3   No current facility-administered medications on file prior to visit.     Allergies  Allergen Reactions  . Augmentin [Amoxicillin-Pot Clavulanate] Hives  . Doxycycline Rash     Objective:  BP 132/72   Pulse 72   Temp 97.9 F (36.6 C) (Oral)   Resp 18   Ht 5' 4" (1.626 m)   Wt 208 lb (94.3 kg)   SpO2 96%   BMI 35.70 kg/m   Physical Exam  Constitutional: She is  oriented to person, place, and time and well-developed, well-nourished, and in no distress.  HENT:  Head: Normocephalic and atraumatic.  Eyes: Conjunctivae are normal.  Neck: Normal range of motion.  Cardiovascular: Normal rate, regular rhythm and normal heart sounds.  Pulmonary/Chest: Effort normal.  Musculoskeletal:       Right lower leg: She exhibits edema (1+ to mid shin). She exhibits no tenderness.       Left lower leg: She exhibits edema (1+ to mid shin). She exhibits no tenderness.  No erythema, warmth, tenderness, or skin breakdown to bilateral lower extremities. Hypertrophied skin changes to bilateral lower extremities due to chronic venous stasis.    Neurological: She is alert and oriented to person, place, and time. Gait normal.  Skin: Skin is warm and dry.  Psychiatric: Affect normal.  Vitals reviewed.   Assessment and Plan :  1. Pure hypercholesterolemia - Lipid panel; Future 2. Decreased GFR Informed patient of lab results from 01/02/17.  She was given a printout of the results to take home.  Lipid panel did show elevated total cholesterol at 233 and LDL elevated at 135.  CMP showed mildly elevated creatinine at 1.08.  She admits that she has not been fasting nor drinking lots of water prior to this lab visit.  She would like to have this repeated when she is fasting and well hydrated.  I have placed future lipid panel and CMP to be completed at her follow-up visit with Dr. Tamala Julian next week. - CMP14+EGFR; Future  3. Stasis dermatitis of both legs Controlled at this time.  There are no signs of cellulitis noted on exam.  Patient is very pleased with how her legs are looking at this time.  Encouraged her to continue with compression stockings and elevation as she is.  4. Anxiety state Patient reassured that her primary care provider has not voiced any intentions of leaving our office. Follow-up with Dr. Tamala Julian on 03/23/17.  Tenna Delaine PA-C  [facility] at [facility] 03/10/2017 9:08 AM

## 2017-03-10 ENCOUNTER — Encounter: Payer: Self-pay | Admitting: Physician Assistant

## 2017-03-13 ENCOUNTER — Telehealth: Payer: Self-pay | Admitting: Family Medicine

## 2017-03-13 NOTE — Telephone Encounter (Signed)
Patient's daughter Chelsea Browning called to ask is her mother is having any symptoms of flu, she said "no. My dad was taken to the doctor yesterday and he has the flu. The physician recommended for her to take Tamilfu as a prophylactic."  If prescription will be sent, send to:  [facility] [address] [place] [address] Phone: [phone] Fax: [phone] I advised the daughter Dr. Tamala Julian would be made aware, she verbalized understanding.

## 2017-03-13 NOTE — Telephone Encounter (Signed)
Copied from [place]. Topic: Quick Communication - See Telephone Encounter >> Mar 13, 2017  9:35 AM Boyd Kerbs wrote: CRM for notification. See Telephone encounter for:     Daughter, [name], called  her dad, Chelsea Browning went to Urgent Care yesterday and has the flu.  The doctor told [name] she may need to get Tamiflu as a  Protolactic to make sure she does not get it.    She is tending to her husband and is sleeping in the same bad.  If prescription please call into:  [facility] [address] [place] [address] [place] [address] Phone: [phone] Fax: [phone]   03/13/17.

## 2017-03-14 ENCOUNTER — Inpatient Hospital Stay (HOSPITAL_COMMUNITY)
Admission: EM | Admit: 2017-03-14 | Discharge: 2017-04-07 | DRG: 296 | Disposition: E | Payer: Medicare HMO | Attending: Pulmonary Disease | Admitting: Pulmonary Disease

## 2017-03-14 ENCOUNTER — Encounter (HOSPITAL_COMMUNITY): Payer: Self-pay | Admitting: Emergency Medicine

## 2017-03-14 ENCOUNTER — Emergency Department (HOSPITAL_COMMUNITY): Payer: Medicare HMO

## 2017-03-14 ENCOUNTER — Other Ambulatory Visit: Payer: Medicare HMO

## 2017-03-14 ENCOUNTER — Inpatient Hospital Stay (HOSPITAL_COMMUNITY): Payer: Medicare HMO

## 2017-03-14 ENCOUNTER — Ambulatory Visit: Payer: Medicare HMO | Admitting: Hematology

## 2017-03-14 DIAGNOSIS — J9602 Acute respiratory failure with hypercapnia: Secondary | ICD-10-CM

## 2017-03-14 DIAGNOSIS — Z7189 Other specified counseling: Secondary | ICD-10-CM | POA: Diagnosis not present

## 2017-03-14 DIAGNOSIS — E875 Hyperkalemia: Secondary | ICD-10-CM | POA: Diagnosis not present

## 2017-03-14 DIAGNOSIS — M542 Cervicalgia: Secondary | ICD-10-CM | POA: Diagnosis not present

## 2017-03-14 DIAGNOSIS — K219 Gastro-esophageal reflux disease without esophagitis: Secondary | ICD-10-CM | POA: Diagnosis present

## 2017-03-14 DIAGNOSIS — M1611 Unilateral primary osteoarthritis, right hip: Secondary | ICD-10-CM | POA: Diagnosis present

## 2017-03-14 DIAGNOSIS — W06XXXA Fall from bed, initial encounter: Secondary | ICD-10-CM | POA: Diagnosis present

## 2017-03-14 DIAGNOSIS — R05 Cough: Secondary | ICD-10-CM

## 2017-03-14 DIAGNOSIS — Z8249 Family history of ischemic heart disease and other diseases of the circulatory system: Secondary | ICD-10-CM | POA: Diagnosis not present

## 2017-03-14 DIAGNOSIS — Z515 Encounter for palliative care: Secondary | ICD-10-CM | POA: Diagnosis not present

## 2017-03-14 DIAGNOSIS — J9690 Respiratory failure, unspecified, unspecified whether with hypoxia or hypercapnia: Secondary | ICD-10-CM

## 2017-03-14 DIAGNOSIS — Y92003 Bedroom of unspecified non-institutional (private) residence as the place of occurrence of the external cause: Secondary | ICD-10-CM | POA: Diagnosis not present

## 2017-03-14 DIAGNOSIS — J9601 Acute respiratory failure with hypoxia: Secondary | ICD-10-CM

## 2017-03-14 DIAGNOSIS — Z881 Allergy status to other antibiotic agents status: Secondary | ICD-10-CM | POA: Diagnosis not present

## 2017-03-14 DIAGNOSIS — D469 Myelodysplastic syndrome, unspecified: Secondary | ICD-10-CM | POA: Diagnosis present

## 2017-03-14 DIAGNOSIS — E785 Hyperlipidemia, unspecified: Secondary | ICD-10-CM | POA: Diagnosis present

## 2017-03-14 DIAGNOSIS — E872 Acidosis: Secondary | ICD-10-CM | POA: Diagnosis not present

## 2017-03-14 DIAGNOSIS — G936 Cerebral edema: Secondary | ICD-10-CM | POA: Diagnosis not present

## 2017-03-14 DIAGNOSIS — Z79899 Other long term (current) drug therapy: Secondary | ICD-10-CM

## 2017-03-14 DIAGNOSIS — I469 Cardiac arrest, cause unspecified: Secondary | ICD-10-CM | POA: Diagnosis not present

## 2017-03-14 DIAGNOSIS — Z7902 Long term (current) use of antithrombotics/antiplatelets: Secondary | ICD-10-CM

## 2017-03-14 DIAGNOSIS — G253 Myoclonus: Secondary | ICD-10-CM | POA: Diagnosis present

## 2017-03-14 DIAGNOSIS — R1110 Vomiting, unspecified: Secondary | ICD-10-CM

## 2017-03-14 DIAGNOSIS — J09X2 Influenza due to identified novel influenza A virus with other respiratory manifestations: Secondary | ICD-10-CM | POA: Diagnosis present

## 2017-03-14 DIAGNOSIS — I10 Essential (primary) hypertension: Secondary | ICD-10-CM | POA: Diagnosis not present

## 2017-03-14 DIAGNOSIS — D472 Monoclonal gammopathy: Secondary | ICD-10-CM | POA: Diagnosis present

## 2017-03-14 DIAGNOSIS — N179 Acute kidney failure, unspecified: Secondary | ICD-10-CM | POA: Diagnosis present

## 2017-03-14 DIAGNOSIS — E8729 Other acidosis: Secondary | ICD-10-CM

## 2017-03-14 DIAGNOSIS — R001 Bradycardia, unspecified: Secondary | ICD-10-CM | POA: Diagnosis not present

## 2017-03-14 DIAGNOSIS — Z880 Allergy status to penicillin: Secondary | ICD-10-CM | POA: Diagnosis not present

## 2017-03-14 DIAGNOSIS — J690 Pneumonitis due to inhalation of food and vomit: Secondary | ICD-10-CM | POA: Diagnosis not present

## 2017-03-14 DIAGNOSIS — Z4682 Encounter for fitting and adjustment of non-vascular catheter: Secondary | ICD-10-CM | POA: Diagnosis not present

## 2017-03-14 DIAGNOSIS — R739 Hyperglycemia, unspecified: Secondary | ICD-10-CM | POA: Diagnosis present

## 2017-03-14 DIAGNOSIS — J90 Pleural effusion, not elsewhere classified: Secondary | ICD-10-CM | POA: Diagnosis not present

## 2017-03-14 DIAGNOSIS — G931 Anoxic brain damage, not elsewhere classified: Secondary | ICD-10-CM | POA: Diagnosis not present

## 2017-03-14 DIAGNOSIS — R404 Transient alteration of awareness: Secondary | ICD-10-CM | POA: Diagnosis not present

## 2017-03-14 DIAGNOSIS — R0902 Hypoxemia: Secondary | ICD-10-CM | POA: Diagnosis not present

## 2017-03-14 DIAGNOSIS — R059 Cough, unspecified: Secondary | ICD-10-CM

## 2017-03-14 DIAGNOSIS — R4182 Altered mental status, unspecified: Secondary | ICD-10-CM | POA: Diagnosis not present

## 2017-03-14 DIAGNOSIS — R030 Elevated blood-pressure reading, without diagnosis of hypertension: Secondary | ICD-10-CM | POA: Diagnosis not present

## 2017-03-14 LAB — COMPREHENSIVE METABOLIC PANEL
ALT: 506 U/L — ABNORMAL HIGH (ref 14–54)
ANION GAP: 13 (ref 5–15)
AST: 448 U/L — ABNORMAL HIGH (ref 15–41)
Albumin: 3.3 g/dL — ABNORMAL LOW (ref 3.5–5.0)
Alkaline Phosphatase: 163 U/L — ABNORMAL HIGH (ref 38–126)
BUN: 17 mg/dL (ref 6–20)
CHLORIDE: 104 mmol/L (ref 101–111)
CO2: 18 mmol/L — AB (ref 22–32)
CREATININE: 1.16 mg/dL — AB (ref 0.44–1.00)
Calcium: 9.5 mg/dL (ref 8.9–10.3)
GFR, EST AFRICAN AMERICAN: 49 mL/min — AB (ref >60)
GFR, EST NON AFRICAN AMERICAN: 43 mL/min — AB (ref >60)
Glucose, Bld: 157 mg/dL — ABNORMAL HIGH (ref 65–99)
Potassium: 5.8 mmol/L — ABNORMAL HIGH (ref 3.5–5.1)
SODIUM: 135 mmol/L (ref 135–145)
Total Bilirubin: 1.3 mg/dL — ABNORMAL HIGH (ref 0.3–1.2)
Total Protein: 5.6 g/dL — ABNORMAL LOW (ref 6.5–8.1)

## 2017-03-14 LAB — BLOOD GAS, ARTERIAL
ACID-BASE DEFICIT: 4.2 mmol/L — AB (ref 0.0–2.0)
Bicarbonate: 20.4 mmol/L (ref 20.0–28.0)
DRAWN BY: 41977
FIO2: 50
MECHVT: 440 mL
O2 SAT: 97.6 %
PATIENT TEMPERATURE: 98.6
PCO2 ART: 37.1 mmHg (ref 32.0–48.0)
PEEP/CPAP: 5 cmH2O
PH ART: 7.358 (ref 7.350–7.450)
RATE: 22 resp/min
pO2, Arterial: 108 mmHg (ref 83.0–108.0)

## 2017-03-14 LAB — CBC WITH DIFFERENTIAL/PLATELET
BASOS PCT: 0 %
Basophils Absolute: 0 10*3/uL (ref 0.0–0.1)
EOS ABS: 0.1 10*3/uL (ref 0.0–0.7)
Eosinophils Relative: 0 %
HEMATOCRIT: 30.7 % — AB (ref 36.0–46.0)
HEMOGLOBIN: 9.7 g/dL — AB (ref 12.0–15.0)
LYMPHS ABS: 1.1 10*3/uL (ref 0.7–4.0)
Lymphocytes Relative: 7 %
MCH: 28.7 pg (ref 26.0–34.0)
MCHC: 31.6 g/dL (ref 30.0–36.0)
MCV: 90.8 fL (ref 78.0–100.0)
MONOS PCT: 4 %
Monocytes Absolute: 0.5 10*3/uL (ref 0.1–1.0)
NEUTROS ABS: 13.4 10*3/uL — AB (ref 1.7–7.7)
NEUTROS PCT: 89 %
Platelets: 157 10*3/uL (ref 150–400)
RBC: 3.38 MIL/uL — AB (ref 3.87–5.11)
RDW: 15.1 % (ref 11.5–15.5)
WBC: 15.1 10*3/uL — ABNORMAL HIGH (ref 4.0–10.5)

## 2017-03-14 LAB — I-STAT TROPONIN, ED: Troponin i, poc: 0.11 ng/mL (ref 0.00–0.08)

## 2017-03-14 LAB — I-STAT CHEM 8, ED
BUN: 22 mg/dL — ABNORMAL HIGH (ref 6–20)
CALCIUM ION: 1.03 mmol/L — AB (ref 1.15–1.40)
Chloride: 103 mmol/L (ref 101–111)
Creatinine, Ser: 1 mg/dL (ref 0.44–1.00)
GLUCOSE: 166 mg/dL — AB (ref 65–99)
HCT: 29 % — ABNORMAL LOW (ref 36.0–46.0)
Hemoglobin: 9.9 g/dL — ABNORMAL LOW (ref 12.0–15.0)
POTASSIUM: 6.2 mmol/L — AB (ref 3.5–5.1)
Sodium: 132 mmol/L — ABNORMAL LOW (ref 135–145)
TCO2: 19 mmol/L — ABNORMAL LOW (ref 22–32)

## 2017-03-14 LAB — I-STAT ARTERIAL BLOOD GAS, ED
ACID-BASE DEFICIT: 5 mmol/L — AB (ref 0.0–2.0)
Acid-base deficit: 9 mmol/L — ABNORMAL HIGH (ref 0.0–2.0)
BICARBONATE: 21.8 mmol/L (ref 20.0–28.0)
Bicarbonate: 20.9 mmol/L (ref 20.0–28.0)
O2 SAT: 95 %
O2 Saturation: 100 %
PCO2 ART: 62.7 mmHg — AB (ref 32.0–48.0)
PO2 ART: 89 mmHg (ref 83.0–108.0)
TCO2: 23 mmol/L (ref 22–32)
TCO2: 23 mmol/L (ref 22–32)
pCO2 arterial: 48.7 mmHg — ABNORMAL HIGH (ref 32.0–48.0)
pH, Arterial: 7.131 — CL (ref 7.350–7.450)
pH, Arterial: 7.262 — ABNORMAL LOW (ref 7.350–7.450)
pO2, Arterial: 277 mmHg — ABNORMAL HIGH (ref 83.0–108.0)

## 2017-03-14 LAB — URINALYSIS, ROUTINE W REFLEX MICROSCOPIC
Bilirubin Urine: NEGATIVE
GLUCOSE, UA: 50 mg/dL — AB
HGB URINE DIPSTICK: NEGATIVE
KETONES UR: NEGATIVE mg/dL
Leukocytes, UA: NEGATIVE
NITRITE: NEGATIVE
PROTEIN: 100 mg/dL — AB
Specific Gravity, Urine: 1.017 (ref 1.005–1.030)
pH: 5 (ref 5.0–8.0)

## 2017-03-14 LAB — TRIGLYCERIDES: Triglycerides: 52 mg/dL (ref <150)

## 2017-03-14 LAB — GLUCOSE, CAPILLARY
GLUCOSE-CAPILLARY: 138 mg/dL — AB (ref 65–99)
GLUCOSE-CAPILLARY: 104 mg/dL — AB (ref 65–99)
Glucose-Capillary: 120 mg/dL — ABNORMAL HIGH (ref 65–99)

## 2017-03-14 LAB — I-STAT CG4 LACTIC ACID, ED
LACTIC ACID, VENOUS: 5.66 mmol/L — AB (ref 0.5–1.9)
Lactic Acid, Venous: 4.04 mmol/L (ref 0.5–1.9)

## 2017-03-14 LAB — RENAL FUNCTION PANEL
ALBUMIN: 3.2 g/dL — AB (ref 3.5–5.0)
ANION GAP: 12 (ref 5–15)
BUN: 24 mg/dL — ABNORMAL HIGH (ref 6–20)
CALCIUM: 8.9 mg/dL (ref 8.9–10.3)
CO2: 19 mmol/L — AB (ref 22–32)
CREATININE: 1.19 mg/dL — AB (ref 0.44–1.00)
Chloride: 104 mmol/L (ref 101–111)
GFR calc non Af Amer: 41 mL/min — ABNORMAL LOW (ref >60)
GFR, EST AFRICAN AMERICAN: 48 mL/min — AB (ref >60)
GLUCOSE: 148 mg/dL — AB (ref 65–99)
PHOSPHORUS: 4.2 mg/dL (ref 2.5–4.6)
Potassium: 4.1 mmol/L (ref 3.5–5.1)
Sodium: 135 mmol/L (ref 135–145)

## 2017-03-14 LAB — PHOSPHORUS: PHOSPHORUS: 4.1 mg/dL (ref 2.5–4.6)

## 2017-03-14 LAB — TROPONIN I
Troponin I: 0.21 ng/mL (ref <0.03)
Troponin I: 0.29 ng/mL (ref <0.03)

## 2017-03-14 LAB — MRSA PCR SCREENING: MRSA BY PCR: NEGATIVE

## 2017-03-14 LAB — PROCALCITONIN: PROCALCITONIN: 32.17 ng/mL

## 2017-03-14 LAB — INFLUENZA PANEL BY PCR (TYPE A & B)
INFLAPCR: POSITIVE — AB
Influenza B By PCR: NEGATIVE

## 2017-03-14 LAB — LIPASE, BLOOD: Lipase: 19 U/L (ref 11–51)

## 2017-03-14 LAB — BRAIN NATRIURETIC PEPTIDE: B NATRIURETIC PEPTIDE 5: 1274.3 pg/mL — AB (ref 0.0–100.0)

## 2017-03-14 MED ORDER — OSELTAMIVIR PHOSPHATE 75 MG PO CAPS: 75.0000 mg | ORAL_CAPSULE | Freq: Every day | ORAL | Status: DC

## 2017-03-14 MED ORDER — DEXTROSE 5 % IV SOLN
1.0000 g | Freq: Two times a day (BID) | INTRAVENOUS | Status: DC
  Administered 2017-03-14: 1 g via INTRAVENOUS
  Filled 2017-03-14 (×2): qty 1

## 2017-03-14 MED ORDER — IPRATROPIUM-ALBUTEROL 0.5-2.5 (3) MG/3ML IN SOLN: 3.0000 mL | RESPIRATORY_TRACT | Status: DC | PRN

## 2017-03-14 MED ORDER — PROPOFOL 1000 MG/100ML IV EMUL
0.0000 ug/kg/min | INTRAVENOUS | Status: DC
  Administered 2017-03-14: 20 ug/kg/min via INTRAVENOUS
  Administered 2017-03-15: 15 ug/kg/min via INTRAVENOUS
  Filled 2017-03-14: qty 100

## 2017-03-14 MED ORDER — DEXTROSE 5 % IV SOLN
2.0000 g | Freq: Once | INTRAVENOUS | Status: AC
  Administered 2017-03-14: 2 g via INTRAVENOUS
  Filled 2017-03-14: qty 2

## 2017-03-14 MED ORDER — VANCOMYCIN HCL IN DEXTROSE 1-5 GM/200ML-% IV SOLN
1000.0000 mg | Freq: Once | INTRAVENOUS | Status: AC
  Administered 2017-03-14: 1000 mg via INTRAVENOUS
  Filled 2017-03-14: qty 200

## 2017-03-14 MED ORDER — ACETAMINOPHEN 325 MG PO TABS: 650.0000 mg | ORAL_TABLET | ORAL | Status: DC | PRN

## 2017-03-14 MED ORDER — INSULIN ASPART 100 UNIT/ML ~~LOC~~ SOLN
1.0000 [IU] | SUBCUTANEOUS | Status: DC
  Administered 2017-03-14: 1 [IU] via SUBCUTANEOUS

## 2017-03-14 MED ORDER — CALCIUM CHLORIDE 10 % IV SOLN
1.0000 g | Freq: Once | INTRAVENOUS | Status: AC
  Administered 2017-03-14: 1 g via INTRAVENOUS

## 2017-03-14 MED ORDER — HEPARIN SODIUM (PORCINE) 5000 UNIT/ML IJ SOLN
5000.0000 [IU] | Freq: Three times a day (TID) | INTRAMUSCULAR | Status: DC
  Administered 2017-03-14 – 2017-03-15 (×3): 5000 [IU] via SUBCUTANEOUS
  Filled 2017-03-14 (×3): qty 1

## 2017-03-14 MED ORDER — SODIUM CHLORIDE 0.9 % IV BOLUS (SEPSIS)
1000.0000 mL | Freq: Once | INTRAVENOUS | Status: AC
  Administered 2017-03-14: 1000 mL via INTRAVENOUS

## 2017-03-14 MED ORDER — ORAL CARE MOUTH RINSE
15.0000 mL | OROMUCOSAL | Status: DC
  Administered 2017-03-14 – 2017-03-15 (×8): 15 mL via OROMUCOSAL

## 2017-03-14 MED ORDER — SODIUM CHLORIDE 0.9 % IV SOLN: 250.0000 mL | INTRAVENOUS | Status: DC | PRN

## 2017-03-14 MED ORDER — DOCUSATE SODIUM 50 MG/5ML PO LIQD: 100.0000 mg | Freq: Two times a day (BID) | ORAL | Status: DC | PRN

## 2017-03-14 MED ORDER — FENTANYL CITRATE (PF) 100 MCG/2ML IJ SOLN
50.0000 ug | INTRAMUSCULAR | Status: DC | PRN
  Administered 2017-03-14 – 2017-03-15 (×3): 50 ug via INTRAVENOUS
  Filled 2017-03-14 (×3): qty 2

## 2017-03-14 MED ORDER — OSELTAMIVIR PHOSPHATE 45 MG PO CAPS: 45.0000 mg | ORAL_CAPSULE | Freq: Every day | ORAL | 0 refills | Status: DC

## 2017-03-14 MED ORDER — BISACODYL 10 MG RE SUPP: 10.0000 mg | Freq: Every day | RECTAL | Status: DC | PRN

## 2017-03-14 MED ORDER — OSELTAMIVIR PHOSPHATE 45 MG PO CAPS: 45.0000 mg | ORAL_CAPSULE | Freq: Every day | ORAL | 0 refills | Status: AC

## 2017-03-14 MED ORDER — DEXTROSE 5 % IV SOLN: 1.0000 g | INTRAVENOUS | Status: DC

## 2017-03-14 MED ORDER — PANTOPRAZOLE SODIUM 40 MG IV SOLR
40.0000 mg | Freq: Every day | INTRAVENOUS | Status: DC
  Administered 2017-03-14: 40 mg via INTRAVENOUS
  Filled 2017-03-14: qty 40

## 2017-03-14 MED ORDER — SODIUM BICARBONATE 8.4 % IV SOLN
50.0000 meq | Freq: Once | INTRAVENOUS | Status: AC
  Administered 2017-03-14: 50 meq via INTRAVENOUS

## 2017-03-14 MED ORDER — OSELTAMIVIR PHOSPHATE 6 MG/ML PO SUSR
30.0000 mg | Freq: Two times a day (BID) | ORAL | Status: DC
  Administered 2017-03-14 – 2017-03-15 (×3): 30 mg
  Filled 2017-03-14 (×4): qty 12.5

## 2017-03-14 MED ORDER — CHLORHEXIDINE GLUCONATE 0.12% ORAL RINSE (MEDLINE KIT)
15.0000 mL | Freq: Two times a day (BID) | OROMUCOSAL | Status: DC
  Administered 2017-03-14 – 2017-03-15 (×2): 15 mL via OROMUCOSAL

## 2017-03-14 MED ORDER — ALBUTEROL SULFATE (2.5 MG/3ML) 0.083% IN NEBU: 2.5000 mg | INHALATION_SOLUTION | RESPIRATORY_TRACT | Status: DC

## 2017-03-14 NOTE — Progress Notes (Signed)
EEG Completed; Results Pending  
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
## 2017-03-14 NOTE — ED Notes (Signed)
MD given ABG results, bicarb given

## 2017-03-14 NOTE — ED Notes (Signed)
Pt family at bedside

## 2017-03-14 NOTE — ED Notes (Signed)
Pt back from CT

## 2017-03-14 NOTE — ED Notes (Signed)
CCM at bedside 

## 2017-03-14 NOTE — Progress Notes (Signed)
Critical ABG results given to A. Simona Huh, RN.

## 2017-03-14 NOTE — Progress Notes (Signed)
   03/11/2017 0944  Clinical Encounter Type  Visited With Patient and family together;Health care provider  Visit Type ED  Referral From Chaplain  Consult/Referral To [place] handed off this family as she was going off duty.  Met the daughter in the waiting area of the ED and escorted her to a consult room.  Alerted medical staff that family was here.  Daughter and I sat waiting for the Doctor.  Other daughter on way from [place] and the spouse of the patient at home recovering from the flu.  Sat with the daughter providing empathetic listening and support.  Waited with her until she could be with her mom.  Provided hospitality.  Will follow them.   Chaplain Katherene Ponto

## 2017-03-14 NOTE — ED Notes (Signed)
Pts daughter Chelsea Browning [phone] (she is a Immunologist) updated via telephone, she is enroute from [place], @ 4 hours.

## 2017-03-14 NOTE — Procedures (Signed)
ELECTROENCEPHALOGRAM REPORT  Date of Study: 03/17/2017  Patient's Name: Chelsea Browning MRN: [id number] Date of Birth: 1932-10-21  Referring Provider: Jennelle Human, NP  Clinical History: This is an 84 year old woman s/p cardiac arrest  Medications: Proprofol turned off 5 minutes prior to EEG Cefepime Tamiflu  Technical Summary: A multichannel digital EEG recording measured by the international 10-20 system with electrodes applied with paste and impedances below 5000 ohms performed in our laboratory with EKG monitoring in an intubated and unresponsive patient.  Hyperventilation and photic stimulation were not performed.  The digital EEG was referentially recorded, reformatted, and digitally filtered in a variety of bipolar and referential montages for optimal display.    Description: The patient is intubated and unresponsive during the recording. Propofol was turned off 5 minutes prior to the study. There is loss of normal background activity. The record read at a sensitivity of 3 uV/mm shows a burst-suppression pattern with diffuse suppression lasting 30-35 seconds and 2-3 second bursts of 5-6 Hz theta activity occurring almost regularly with theta activity starting on the left hemisphere for 1 second followed by 1-2 seconds on the right hemisphere. She is noted to have chin quiver each time. There is no spontaneous reactivity or reactivity noted with noxious stimulation. Hyperventilation and photic stimulation were not performed. There were no epileptiform discharges or electrographic seizures seen.   EKG lead showed irregular rhythm.   Impression: This EEG is markedly abnormal due to burst-suppression pattern.  Clinical Correlation: This record shows evidence of severe diffuse or bilateral cerebral dysfunction and can be seen in the setting of anoxic/ischemic injury, toxic/metabolic encephalopathies, or medication effect. The bursts of theta activity as noted above are noted to have a  regular pattern raising the possibility of artifact as well. Clinical correlation is advised.  Ellouise Newer, M.D.

## 2017-03-14 NOTE — Telephone Encounter (Signed)
I called [facility] and cancelled the Rx and patient notified

## 2017-03-14 NOTE — Telephone Encounter (Signed)
Oops!  Sent to wrong pharmacy.  Sent to [facility].  Please call [facility] to cancel rx.  Please advise pt.

## 2017-03-14 NOTE — ED Notes (Signed)
Attempted report x1. 

## 2017-03-14 NOTE — Progress Notes (Signed)
Attending:  I have seen and examined the patient with nurse practitioner/resident and agree with the note above.  We formulated the plan together and I elicited the following history.    Subjective: 84 year old female with a past medical history significant for monoclonal gammopathy of undetermined significance presented to the emergency department today after a cardiac arrest at home.  Apparently the patient's husband had been treated with Tamiflu for influenza recently.  By report the patient developed a cough in the evening prior to admission.  By 6:30 in the morning on February 5 she was noted to be incoherent and short of breath.  EMS was called.  Upon their arrival around 7 AM she was in PEA arrest.  CPR was performed.  She was brought to the emergency department.  There she was found to have a fever and was intubated for respiratory failure.  Pulmonary and critical care medicine was consulted for further evaluation.  Objective: Vitals:   03/18/2017 1130 03/24/2017 1145 03/21/2017 1200 04/03/2017 1205  BP: (!) 136/57 (!) 147/49 126/62 (!) 145/55  Pulse: (!) 55 (!) 57 (!) 58 (!) 57  Resp: (!) 28 (!) 33 (!) 21 16  Temp:      TempSrc:      SpO2: 98% 99% 99% 98%  Weight:      Height:       Vent Mode: PRVC FiO2 (%):  [70 %-100 %] 70 % Set Rate:  [15 bmp-22 bmp] 22 bmp Vt Set:  [440 mL] 440 mL PEEP:  [5 cmH20] 5 cmH20 Plateau Pressure:  [25 cmH20-26 cmH20] 26 cmH20  Intake/Output Summary (Last 24 hours) at 03/28/2017 1222 Last data filed at 03/17/2017 1205 Gross per 24 hour  Intake 1250 ml  Output -  Net 1250 ml    General:  In bed on vent HENT: NCAT ETT in place PULM: CTA B, vent supported breathing CV: RRR, no mgr GI: BS+, soft, nontender MSK: normal bulk and tone Neuro: Eyes open, myoclonic jerking around face, cough intact, does not respond to painful stimuli on my exam     CBC    Component Value Date/Time   WBC 15.1 (H) 03/27/2017 0813   RBC 3.38 (L) 03/25/2017 0813   HGB  9.9 (L) 03/18/2017 0832   HGB 9.7 (L) 01/02/2017 1738   HGB 9.7 (L) 09/13/2016 1432   HCT 29.0 (L) 03/16/2017 0832   HCT 29.9 (L) 01/02/2017 1738   HCT 29.5 (L) 09/13/2016 1432   PLT 157 03/19/2017 0813   PLT 191 01/02/2017 1738   MCV 90.8 03/13/2017 0813   MCV 90 01/02/2017 1738   MCV 91.6 09/13/2016 1432   MCH 28.7 03/28/2017 0813   MCHC 31.6 03/26/2017 0813   RDW 15.1 03/20/2017 0813   RDW 15.3 01/02/2017 1738   RDW 14.7 (H) 09/13/2016 1432   LYMPHSABS 1.1 03/31/2017 0813   LYMPHSABS 0.9 01/02/2017 1738   LYMPHSABS 0.6 (L) 09/13/2016 1432   MONOABS 0.5 03/13/2017 0813   MONOABS 0.4 09/13/2016 1432   EOSABS 0.1 03/23/2017 0813   EOSABS 0.2 01/02/2017 1738   BASOSABS 0.0 04/06/2017 0813   BASOSABS 0.0 01/02/2017 1738   BASOSABS 0.0 09/13/2016 1432    BMET    Component Value Date/Time   NA 132 (L) 03/25/2017 0832   NA 139 01/02/2017 1738   NA 138 09/13/2016 1432   K 6.2 (H) 03/13/2017 0832   K 4.1 09/13/2016 1432   CL 103 03/20/2017 0832   CO2 18 (L) 03/27/2017 0813  CO2 23 09/13/2016 1432   GLUCOSE 166 (H) 03/31/2017 0832   GLUCOSE 109 09/13/2016 1432   BUN 22 (H) 03/21/2017 0832   BUN 24 01/02/2017 1738   BUN 18.1 09/13/2016 1432   CREATININE 1.00 03/24/2017 0832   CREATININE 0.8 09/13/2016 1432   CALCIUM 9.5 04/03/2017 0813   CALCIUM 9.4 09/13/2016 1432   GFRNONAA 43 (L) 03/28/2017 0813   GFRNONAA 69 07/29/2014 1347   GFRAA 49 (L) 03/12/2017 0813   GFRAA 79 07/29/2014 1347    CXR images reviewed showing a likely right upper lobe infiltrate, endotracheal tube in place Twelve-lead EKG reviewed showing sinus bradycardia, slightly elevated T wave but no ST wave changes  Impression/Plan:  Acute respiratory failure with hypoxemia: Due to cardiac arrest, continue full ventilator support Likely aspiration pneumonia in the right upper lobe: Continue Unasyn, send respiratory culture Acute encephalopathy in the setting of anoxic brain injury: Continue supportive  care, minimize sedation, frequent neuro exams.  Overall prognosis seems poor given myoclonic jerking on exam and duration of CPR. Cardiac arrest: Given cardiac arrest for at least 20 minutes and findings of edema on CT head overall prognosis appears poor at this point.  Given the fact that this was not a primary cardiac event there is no role for targeted temperature management.  Continue telemetry. Presumed influenza: Send respiratory panel, start empiric Tamiflu  I met with the patient's daughter [name] at bedside.  She understands that her mother's prognosis is guarded at this point.  The patient's other daughter is coming from out of town.  Remains full code at this point  My cc time 35 minutes  Roselie Awkward, MD [place] [facility] Pager: [phone] Cell: [phone] After 3pm or if no response, call [phone]

## 2017-03-14 NOTE — ED Notes (Signed)
0810-DrAshok Cordia at bedside 0811-Epi amp given for bradycardia, HR 46, 167.55, 100% by BVM 0818-MD preparing to remove [facility] airway and intubate, some movement/agonal respirations noted Etomidate 20mg  given L wrist 0819-Succs 100 given L wrist 0821 pt intubated by Dr Ashok Cordia on first pass, 7.5 ETT, 22 at lip, +color change, + BS in all fields,  0832-HR back down to 46, 1 amp Epi given 136/55 0830 PCXR done. 0243 + pulses, HR 51, 100%, 232/98 0849-+pulses HR 66 172/56, 100%

## 2017-03-14 NOTE — Progress Notes (Signed)
Pt transported on vent to CT and returned to ED Trauma C. Patient's vitals remained stable throughout the trip.

## 2017-03-14 NOTE — Telephone Encounter (Signed)
Pt husband stated pt is in the hospital and won't be at appt today

## 2017-03-14 NOTE — Progress Notes (Addendum)
PCCM Interval Note  S:  Flu A+ TTE pending EEG markedly abnormal with burst-suppression pattern c/w severe/ diffuse cerebral dysfunction; no epileptiform activity Not currently on continuous sedation  Mildly hypertensive, tachypneic  O:  General:  Critically ill elderly female  HEENT: pupils 3/minimally reactive, no corneal reflexes, does not blink to threat, facial myoclonus  Neuro: unresponsive to pain CV: ir rrr, no m/r/g PULM: even/non-labored on MV, breathing over vent- mid 30's, +cough Skin: cool/dry  A:  Acute encephalopathy concern for severe anoxic injury, r/o metabolic abnormality Cardiac Arrest Influenza Aspiration PNA  P:  Start propofol at low dose Recheck ABG in 1 hour to rule out metabolic abnormalities  AM labs, CXR and ABG Ongoing neuro exams Continue tamiflu and cefepime   Family:  Other daughter Janett Billow, from out of town, updated at bedside.  States her mother would want everything possibly done.  Will continue current therapies and reassess however prognosis appears to be poor.    Additional CCT 35 mins  Kennieth Rad, AGACNP-BC [facility] Pgr: [phone] or if no answer [phone] 03/16/2017, 6:25 PM

## 2017-03-14 NOTE — ED Notes (Signed)
Daughter at bedside, daughter states pt has been helping care for her husband who was dx with flu by PCR 2 days ago. Pt did develop a cough last night, husband stated patient may have mentioned "hospital" in the middle of the night.

## 2017-03-14 NOTE — H&P (Signed)
PULMONARY / CRITICAL CARE MEDICINE   Name: Chelsea Browning MRN: [id number] DOB: February 26, 1932    ADMISSION DATE:  03/17/2017 CONSULTATION DATE:  03/27/2017  REFERRING MD:  Dr. Ashok Cordia  CHIEF COMPLAINT:  Cardiac Arrest   HISTORY OF PRESENT ILLNESS:   HPI obtained from medical chart review as patient is currently intubated with acute encephalopathy.   84 year old female with past medical history significant for myelodysplastic syndrome, MGUS, hypogammaglobulinemia, anemia, recurrent LE cellulitis, HTN, G1DD, HLD, GERD, Anxiety, and right hip osteoarthritis who presented to the ER on 2/5 in cardiac arrest.   Patient's husband was diagnosed with the flu on 2/3.  Patient was caring for him, sleeping in the same bed, and refused to wear a mask.  Patient unable to start tamiflu yesterday for prophylaxis.  She complained of a cough to her daughter last night.  They had a caretaker staying overnight who found her in respiratory distress around 630 this morning.  Daughter states mother was incoherent talking with her over the phone at that time.  On daughter's arrival with EMS, she was found in PEA and apneic on the floor, apparently had rolled or fell off the bed. CPR, King airway, and ACLS measures given from [id number] to 0730 with ROSC and again loss of pulses with PEA arrest from 0755 to 0805.    On arrival to the ER, she remained unresponsive.  King airway exchanged for endotracheal tube.  Patient with bradycardic episodes in ER found to be hyperkalemic, improved after treatment with calcium and bicarbonate.  Low grade temp 100.5, WBC 15.1, CXR with right greater than left upper infiltrates.  Head CT with findings concerning for early anoxic injury.  PCCM called to admit.   PAST MEDICAL HISTORY :  She  has a past medical history of Anemia, Anxiety, Arthritis, Blood transfusion without reported diagnosis, Carotid artery disease ([place]) (07/24/2013), Cataract, Cellulitis, Depression with anxiety, Esophageal reflux  (07/24/2013), H/O Clostridium difficile infection (10/07/2014), Hiatal hernia with gastroesophageal reflux (07/24/2013), HLD (hyperlipidemia), HTN (hypertension) (07/24/2013), Hyponatremia, Obesity, unspecified (07/24/2013), Pedal edema (07/24/2013), and Urinary incontinence.  PAST SURGICAL HISTORY: She  has a past surgical history that includes Appendectomy; Cosmetic surgery; Tracheostomy; Tracheostomy closure; Jejunostomy feeding tube; esophageal hernia surgery; Skin graft; and Tonsillectomy.  Allergies  Allergen Reactions  . Augmentin [Amoxicillin-Pot Clavulanate] Hives  . Doxycycline Rash    No current facility-administered medications on file prior to encounter.    Current Outpatient Medications on File Prior to Encounter  Medication Sig  . acetaminophen (TYLENOL) 500 MG tablet Take 1,000 mg by mouth every 6 (six) hours as needed for moderate pain.  Marland Kitchen amLODipine (NORVASC) 5 MG tablet Take 5 mg by mouth daily.  Marland Kitchen atorvastatin (LIPITOR) 40 MG tablet Take 1 tablet (40 mg total) by mouth daily.  . diphenoxylate-atropine (LOMOTIL) 2.5-0.025 MG tablet TAKE 1 TABLET 3 TIMES DAILY AS NEEDED FOR DIARRHEA OR LOOSE STOOLS.  Marland Kitchen dipyridamole (PERSANTINE) 75 MG tablet Take 1 tablet (75 mg total) by mouth 2 (two) times daily.  Marland Kitchen HYDROcodone-acetaminophen (NORCO) 5-325 MG tablet Take 1 tablet by mouth every 6 (six) hours as needed.  . labetalol (NORMODYNE) 200 MG tablet Take 1 tablet (200 mg total) by mouth 3 (three) times daily.  . Lidocaine 1.8 % PTCH Apply 1 patch topically as needed (back pain).  Marland Kitchen losartan (COZAAR) 50 MG tablet Take 1 tablet (50 mg total) by mouth daily.  Marland Kitchen omeprazole (PRILOSEC) 20 MG capsule TAKE 1 CAPSULE ONCE DAILY BEFORE BREAKFAST.  Marland Kitchen Propylene Glycol-Glycerin (  SOOTHE) 0.6-0.6 % SOLN Apply 1 drop to eye as needed (dry eyes).  Marland Kitchen spironolactone (ALDACTONE) 25 MG tablet Take 25 mg by mouth daily.  Marland Kitchen triamcinolone cream (KENALOG) 0.1 % APPLY TO AFFECTED AREAS ON LEGS ONCE OR TWICE  DAILY.  Marland Kitchen oseltamivir (TAMIFLU) 45 MG capsule Take 1 capsule (45 mg total) by mouth daily.    FAMILY HISTORY:  Her indicated that her mother is deceased. She indicated that her father is deceased. She indicated that only one of her two sisters is alive. She indicated that her maternal grandmother is deceased. She indicated that her maternal grandfather is deceased. She indicated that her paternal grandmother is deceased. She indicated that her paternal grandfather is deceased. She indicated that both of her daughters are alive.   SOCIAL HISTORY: She  reports that  has never smoked. she has never used smokeless tobacco. She reports that she drinks alcohol. She reports that she does not use drugs.  REVIEW OF SYSTEMS:   Unable.  Patient intubated with acute encephalopathy  SUBJECTIVE:   VITAL SIGNS: BP (!) 135/50   Pulse (!) 58   Temp 100.1 F (37.8 C) (Rectal)   Resp (!) 22   Ht 5\' 4"  (1.626 m)   Wt 208 lb (94.3 kg)   SpO2 95%   BMI 35.70 kg/m   HEMODYNAMICS:    VENTILATOR SETTINGS: Vent Mode: PRVC FiO2 (%):  [70 %-100 %] 70 % Set Rate:  [15 bmp-22 bmp] 22 bmp Vt Set:  [440 mL] 440 mL PEEP:  [5 cmH20] 5 cmH20 Plateau Pressure:  [25 cmH20-26 cmH20] 26 cmH20  INTAKE / OUTPUT: No intake/output data recorded.  PHYSICAL EXAMINATION: General:  Critically ill elderly female unresponsive on MV HEENT: frontal alopecia, pupils 3/minmally reactive, no corneal's, no blink to threat, ETT/ OGT, fasciculations noted to both eyelids and chin area  Neuro:  Unresponsive, no response to pain, no gag, intermittent cough CV:  rrr, no m/r/g PULM: even/non-labored on MV, breathing above vent, rhonchi bilaterally- with thick secretions GI: obese, soft, bs hypo Extremities: warm/dry, trace BLE Skin: no rashes  LABS:  BMET Recent Labs  Lab 03/24/2017 0813 03/16/2017 0832  NA 135 132*  K 5.8* 6.2*  CL 104 103  CO2 18*  --   BUN 17 22*  CREATININE 1.16* 1.00  GLUCOSE 157* 166*     Electrolytes Recent Labs  Lab 03/22/2017 0813  CALCIUM 9.5    CBC Recent Labs  Lab 03/11/2017 0813 03/23/2017 0832  WBC 15.1*  --   HGB 9.7* 9.9*  HCT 30.7* 29.0*  PLT 157  --     Coag's No results for input(s): APTT, INR in the last 168 hours.  Sepsis Markers Recent Labs  Lab 04/01/2017 0832 04/05/2017 1036  LATICACIDVEN 5.66* 4.04*    ABG Recent Labs  Lab 03/10/2017 0908 03/20/2017 1058  PHART 7.131* 7.262*  PCO2ART 62.7* 48.7*  PO2ART 277.0* 89.0    Liver Enzymes Recent Labs  Lab 04/06/2017 0813  AST 448*  ALT 506*  ALKPHOS 163*  BILITOT 1.3*  ALBUMIN 3.3*    Cardiac Enzymes No results for input(s): TROPONINI, PROBNP in the last 168 hours.  Glucose No results for input(s): GLUCAP in the last 168 hours.  Imaging Ct Head Wo Contrast  Result Date: 03/17/2017 CLINICAL DATA:  Altered mental status. Emesis and suspected respiratory arrest. CPR for estimated 20-30 minutes. EXAM: CT HEAD WITHOUT CONTRAST CT CERVICAL SPINE WITHOUT CONTRAST TECHNIQUE: Multidetector CT imaging of the head and cervical spine  was performed following the standard protocol without intravenous contrast. Multiplanar CT image reconstructions of the cervical spine were also generated. COMPARISON:  None. FINDINGS: CT HEAD FINDINGS Brain: Limited evaluation the brain due to streak artifact. Patchy low-density appearance of the cerebellum is likely primarily related to this artifact. Gyral and folia spacing is less than expected for age, which could be an early sign of edema and anoxic injury. Sulcal narrowing is less prominent on the right where there has been a moderate remote parietal infarct. Vascular: Atherosclerotic calcification. Suspect prior calcified right MCA branch embolus associated with above infarct. Skull: No acute finding. Sinuses/Orbits: Right cataract resection. CT CERVICAL SPINE FINDINGS Alignment: No traumatic malalignment. C2-3 and C6-7 degenerative anterolisthesis. Skull base and  vertebrae: Negative for fracture Soft tissues and spinal canal: No prevertebral fluid or swelling. No visible canal hematoma. Disc levels:  Diffuse degenerative disease. Upper chest: Apical lungs show interlobular septal thickening consistent with edema. There is also centrilobular nodules of indeterminate significance. There may have been aspiration. These results were called by telephone at the time of interpretation on 03/23/2017 at 10:07 am to Dr. Lajean Saver , who verbally acknowledged these results. IMPRESSION: Head CT: 1. Possible mild generalized swelling which may reflect early changes of anoxic injury. No hemorrhage or shift. 2. Moderate remote right parietal infarct. Cervical spine CT: 1. Negative for fracture. 2. Edema and mild nodularity seen in the apical lungs. 3. Advanced degenerative disease with C2-3 and C6-7 anterolisthesis. Electronically Signed   By: Monte Fantasia M.D.   On: 04/01/2017 10:08   Ct Cervical Spine Wo Contrast  Result Date: 03/30/2017 CLINICAL DATA:  Altered mental status. Emesis and suspected respiratory arrest. CPR for estimated 20-30 minutes. EXAM: CT HEAD WITHOUT CONTRAST CT CERVICAL SPINE WITHOUT CONTRAST TECHNIQUE: Multidetector CT imaging of the head and cervical spine was performed following the standard protocol without intravenous contrast. Multiplanar CT image reconstructions of the cervical spine were also generated. COMPARISON:  None. FINDINGS: CT HEAD FINDINGS Brain: Limited evaluation the brain due to streak artifact. Patchy low-density appearance of the cerebellum is likely primarily related to this artifact. Gyral and folia spacing is less than expected for age, which could be an early sign of edema and anoxic injury. Sulcal narrowing is less prominent on the right where there has been a moderate remote parietal infarct. Vascular: Atherosclerotic calcification. Suspect prior calcified right MCA branch embolus associated with above infarct. Skull: No acute  finding. Sinuses/Orbits: Right cataract resection. CT CERVICAL SPINE FINDINGS Alignment: No traumatic malalignment. C2-3 and C6-7 degenerative anterolisthesis. Skull base and vertebrae: Negative for fracture Soft tissues and spinal canal: No prevertebral fluid or swelling. No visible canal hematoma. Disc levels:  Diffuse degenerative disease. Upper chest: Apical lungs show interlobular septal thickening consistent with edema. There is also centrilobular nodules of indeterminate significance. There may have been aspiration. These results were called by telephone at the time of interpretation on 04/06/2017 at 10:07 am to Dr. Lajean Saver , who verbally acknowledged these results. IMPRESSION: Head CT: 1. Possible mild generalized swelling which may reflect early changes of anoxic injury. No hemorrhage or shift. 2. Moderate remote right parietal infarct. Cervical spine CT: 1. Negative for fracture. 2. Edema and mild nodularity seen in the apical lungs. 3. Advanced degenerative disease with C2-3 and C6-7 anterolisthesis. Electronically Signed   By: Monte Fantasia M.D.   On: 03/31/2017 10:08   Dg Chest Port 1 View  Result Date: 03/23/2017 CLINICAL DATA:  Hypoxia EXAM: PORTABLE CHEST 1 VIEW COMPARISON:  March 05, 2015 FINDINGS: Endotracheal tube tip is 3.1 cm above the carina. Nasogastric tube tip and side port are below the diaphragm. No pneumothorax. There is mild interstitial pulmonary edema. No airspace consolidation. There is mild cardiomegaly with pulmonary venous hypertension. There is aortic atherosclerosis. No adenopathy. There is degenerative change in each shoulder. IMPRESSION: Tube positions as described without pneumothorax. There is a degree of pulmonary vascular congestion with mild interstitial edema. No consolidation. There is aortic atherosclerosis. Aortic Atherosclerosis (ICD10-I70.0). Electronically Signed   By: Lowella Grip III M.D.   On: 03/26/2017 08:55   STUDIES:  2/5  Head CT: 1.  Possible mild generalized swelling which may reflect early changes of anoxic injury. No hemorrhage or shift. 2. Moderate remote right parietal infarct.  2/5  Cervical spine CT: 1. Negative for fracture. 2. Edema and mild nodularity seen in the apical lungs. 3. Advanced degenerative disease with C2-3 and C6-7 anterolisthesis.  TTE >> EEG >>  CULTURES: 2/5 Blood cultures >> 2/5  Flu >>   ANTIBIOTICS: 2/5 cefepime >>  SIGNIFICANT EVENTS: 2/5 Cardiac Arrest   LINES/TUBES: PIV x 2 >>   DISCUSSION: 30 yoF admitted after prolonged PEA arrest.  Husband recently diagnosed with the Flu.  Remained unresponsive on arrival requiring mechanical ventilation.  Concerns for anoxic injury with ? Myoclonus.    ASSESSMENT / PLAN:  PULMONARY A: Acute hypoxic respiratory failure in the setting of Cardiac arrest Aspiration pneumonia RUL > LUL R/o Flu P:   Full vent support PRVC 8 cc/kg Rate 22 ABG now VAP protocol See ID  Duonebs prn  Empiric tamiflu  CARDIOVASCULAR A:  PEA Cardiac arrest - unclear etiology Hx HLD, HTN, G1DD P:  Tele monitoring Goal MAP > 65 Trend troponin/ EKG BNP pending TTE Lactate improving  RENAL A:   Lactic acidosis Hyperkalemia (did receive succinylcholine) AKI - improved after 1L NS P:   Repeat BMET now Trend BMP / mag/phos/ urinary output/ daily wt Insert foley  Replace electrolytes as indicated  GASTROINTESTINAL A:   NPO Transaminitis - ? 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  Husband is sick at home with the flu.  Awaiting for second daughter to arrive, who is a Immunologist, from out of town, about 4 hours away.  Patient is Jewish and daughter has arranged for a prayer meeting at 1pm.  Would like continue full support until her sister arrives and then family is considering transitioning to comfort measures.    - Inter-disciplinary family meet or Palliative Care meeting due by:  03/21/2017  CCT 60 mins  Kennieth Rad, AGACNP-BC [facility] Pgr: [phone] or if no answer [phone] 03/19/2017, 12:17 PM   Much better

## 2017-03-14 NOTE — ED Triage Notes (Signed)
Pt arrived  via EMS from home per daughter pt stated she did not feel well this am, pt found to be pulseless and apneic by fire, CPR started at 0712, pulses obtained 0730 after 2 epi's. CPR restarted at 0755, 1 epi given and gtt started, pt arrived with pulses, agonal respirations,  King airway in place, #20 R wrist, IO RLE.

## 2017-03-14 NOTE — Progress Notes (Signed)
Patient transported on vent from ED to [place]. Patient's vitals remained stable throughout the trip.

## 2017-03-14 NOTE — Telephone Encounter (Signed)
Please call --- Tamiflu sent to [facility].

## 2017-03-15 ENCOUNTER — Ambulatory Visit: Payer: Medicare HMO | Admitting: Family Medicine

## 2017-03-15 ENCOUNTER — Inpatient Hospital Stay (HOSPITAL_COMMUNITY): Payer: Medicare HMO

## 2017-03-15 DIAGNOSIS — I10 Essential (primary) hypertension: Secondary | ICD-10-CM

## 2017-03-15 DIAGNOSIS — G931 Anoxic brain damage, not elsewhere classified: Secondary | ICD-10-CM

## 2017-03-15 DIAGNOSIS — Z7189 Other specified counseling: Secondary | ICD-10-CM

## 2017-03-15 DIAGNOSIS — Z515 Encounter for palliative care: Secondary | ICD-10-CM

## 2017-03-15 LAB — GLUCOSE, CAPILLARY
GLUCOSE-CAPILLARY: 87 mg/dL (ref 65–99)
GLUCOSE-CAPILLARY: 88 mg/dL (ref 65–99)
Glucose-Capillary: 85 mg/dL (ref 65–99)
Glucose-Capillary: 94 mg/dL (ref 65–99)

## 2017-03-15 LAB — CBC
HCT: 30.5 % — ABNORMAL LOW (ref 36.0–46.0)
HEMOGLOBIN: 10 g/dL — AB (ref 12.0–15.0)
MCH: 28.5 pg (ref 26.0–34.0)
MCHC: 32.8 g/dL (ref 30.0–36.0)
MCV: 86.9 fL (ref 78.0–100.0)
Platelets: 118 10*3/uL — ABNORMAL LOW (ref 150–400)
RBC: 3.51 MIL/uL — ABNORMAL LOW (ref 3.87–5.11)
RDW: 15 % (ref 11.5–15.5)
WBC: 13.8 10*3/uL — ABNORMAL HIGH (ref 4.0–10.5)

## 2017-03-15 LAB — BLOOD GAS, ARTERIAL
Acid-base deficit: 4.3 mmol/L — ABNORMAL HIGH (ref 0.0–2.0)
Bicarbonate: 19.4 mmol/L — ABNORMAL LOW (ref 20.0–28.0)
DRAWN BY: 41977
FIO2: 40
O2 Saturation: 98.6 %
PATIENT TEMPERATURE: 98.6
PCO2 ART: 30.6 mmHg — AB (ref 32.0–48.0)
PEEP: 5 cmH2O
PO2 ART: 123 mmHg — AB (ref 83.0–108.0)
RATE: 22 resp/min
VT: 440 mL
pH, Arterial: 7.418 (ref 7.350–7.450)

## 2017-03-15 LAB — COMPREHENSIVE METABOLIC PANEL
ALT: 498 U/L — ABNORMAL HIGH (ref 14–54)
ANION GAP: 15 (ref 5–15)
AST: 369 U/L — ABNORMAL HIGH (ref 15–41)
Albumin: 3 g/dL — ABNORMAL LOW (ref 3.5–5.0)
Alkaline Phosphatase: 136 U/L — ABNORMAL HIGH (ref 38–126)
BUN: 29 mg/dL — ABNORMAL HIGH (ref 6–20)
CHLORIDE: 104 mmol/L (ref 101–111)
CO2: 17 mmol/L — AB (ref 22–32)
Calcium: 8.6 mg/dL — ABNORMAL LOW (ref 8.9–10.3)
Creatinine, Ser: 1.28 mg/dL — ABNORMAL HIGH (ref 0.44–1.00)
GFR calc Af Amer: 44 mL/min — ABNORMAL LOW (ref >60)
GFR calc non Af Amer: 38 mL/min — ABNORMAL LOW (ref >60)
GLUCOSE: 90 mg/dL (ref 65–99)
POTASSIUM: 4.1 mmol/L (ref 3.5–5.1)
SODIUM: 136 mmol/L (ref 135–145)
Total Bilirubin: 1.1 mg/dL (ref 0.3–1.2)
Total Protein: 5.2 g/dL — ABNORMAL LOW (ref 6.5–8.1)

## 2017-03-15 LAB — TROPONIN I: Troponin I: 0.28 ng/mL (ref <0.03)

## 2017-03-15 LAB — MAGNESIUM: MAGNESIUM: 1.7 mg/dL (ref 1.7–2.4)

## 2017-03-15 MED ORDER — DEXTROSE 5 % IV SOLN
2.0000 g | INTRAVENOUS | Status: DC
  Filled 2017-03-15: qty 2

## 2017-03-15 MED ORDER — MORPHINE SULFATE 25 MG/ML IV SOLN
10.0000 mg/h | INTRAVENOUS | Status: DC
  Administered 2017-03-15: 10 mg/h via INTRAVENOUS
  Filled 2017-03-15: qty 10

## 2017-03-15 MED ORDER — MORPHINE BOLUS VIA INFUSION
5.0000 mg | INTRAVENOUS | Status: DC | PRN
  Filled 2017-03-15: qty 20

## 2017-03-15 MED ORDER — METOPROLOL TARTRATE 5 MG/5ML IV SOLN: 2.5000 mg | INTRAVENOUS | Status: DC | PRN

## 2017-03-16 ENCOUNTER — Telehealth: Payer: Self-pay

## 2017-03-16 MED FILL — Medication: Qty: 1 | Status: AC

## 2017-03-16 NOTE — Telephone Encounter (Signed)
Copied from [place] [id number]. Topic: General - Other >> 04/08/2017  4:34 PM Oneta Rack wrote:   Patient's daughter [name] called to inform Dr. Reginia Forts patient passed away today and that her mother loved Dr. Tamala Julian dearly.    (patient appointment today at [place] cancelled.  >> 04/08/2017  4:37 PM Oneta Rack wrote:   Patient's daughter [name] called to inform Dr. Reginia Forts patient passed away today and that her mother loved Dr. Tamala Julian dearly.    (patient appointment today at [place] cancelled.

## 2017-03-17 ENCOUNTER — Telehealth: Payer: Self-pay

## 2017-03-17 NOTE — Telephone Encounter (Signed)
On 03/17/17 I received a d/c from [facility] (original). The d/c is for burial. The patient is patient of Doctor Nelda Marseille. The d/c will be taken to [facility] ICU for signature.  On 03/20/17 I received the d/c back from Doctor Nelda Marseille. I got the d/c ready and called the funeral home to let them know the d/c is ready for pickup.

## 2017-03-19 LAB — CULTURE, BLOOD (ROUTINE X 2)
CULTURE: NO GROWTH
Culture: NO GROWTH
Special Requests: ADEQUATE
Special Requests: ADEQUATE

## 2017-03-20 NOTE — Telephone Encounter (Signed)
Spoke with daughter

## 2017-04-03 ENCOUNTER — Ambulatory Visit: Payer: Medicare HMO | Admitting: Family Medicine

## 2017-04-07 NOTE — Death Summary Note (Signed)
DEATH SUMMARY   Patient Details  Name: Chelsea Browning MRN: [id number] DOB: 06-04-1932  Admission/Discharge Information   Admit Date:  03/19/2017  Date of Death: Date of Death: 03-20-17  Time of Death: Time of Death: 1549-01-01  Length of Stay: 1  Referring Physician: Ethelda Chick, MD   Reason(s) for Hospitalization  Pulseless electrical activity cardiac arrest  Diagnoses  Preliminary cause of death:   Pulseless electrical activity cardiac arrest Secondary Diagnoses (including complications and co-morbidities):  Active Problems:   Cardiac arrest (HCC)   Anoxic brain injury [facility])   Brief Hospital Course (including significant findings, care, treatment, and services provided and events leading to death)  Chelsea Browning is a 84 y.o. year old female with past medical history significant for myelodysplastic syndrome, MGUS, hypogammaglobulinemia, anemia, recurrent LE cellulitis, HTN, G1DD, HLD, GERD, Anxiety, and right hip osteoarthritis who presented to the ER on 2/5 in cardiac arrest.   Patient's husband was diagnosed with the flu on 2/3.  Patient was caring for him, sleeping in the same bed, and refused to wear a mask.  Patient unable to start tamiflu yesterday for prophylaxis.  She complained of a cough to her daughter last night.  They had a caretaker staying overnight who found her in respiratory distress around 630 this morning.  Daughter states mother was incoherent talking with her over the phone at that time.  On daughter's arrival with EMS, she was found in PEA and apneic on the floor, apparently had rolled or fell off the bed. CPR, King airway, and ACLS measures given from [phone] to 0730 with ROSC and again loss of pulses with PEA arrest from 0755 to 0805.    On arrival to the ER, she remained unresponsive.  King airway exchanged for endotracheal tube.  Patient with bradycardic episodes in ER found to be hyperkalemic, improved after treatment with calcium and bicarbonate.  Low grade  temp 100.5, WBC 15.1, CXR with right greater than left upper infiltrates.  Head CT with findings concerning for early anoxic injury.  PCCM called to admit.   On 2/6 PCCM had a meeting with family who informed us that patient would not want this level of care with current neurologic damage and patient was started on morphine and extubated to expire shortly thereafter with the family bedside.   Pertinent Labs and Studies  Significant Diagnostic Studies Ct Head Wo Contrast  Result Date: 2017-03-19 CLINICAL DATA:  Altered mental status. Emesis and suspected respiratory arrest. CPR for estimated 20-30 minutes. EXAM: CT HEAD WITHOUT CONTRAST CT CERVICAL SPINE WITHOUT CONTRAST TECHNIQUE: Multidetector CT imaging of the head and cervical spine was performed following the standard protocol without intravenous contrast. Multiplanar CT image reconstructions of the cervical spine were also generated. COMPARISON:  None. FINDINGS: CT HEAD FINDINGS Brain: Limited evaluation the brain due to streak artifact. Patchy low-density appearance of the cerebellum is likely primarily related to this artifact. Gyral and folia spacing is less than expected for age, which could be an early sign of edema and anoxic injury. Sulcal narrowing is less prominent on the right where there has been a moderate remote parietal infarct. Vascular: Atherosclerotic calcification. Suspect prior calcified right MCA branch embolus associated with above infarct. Skull: No acute finding. Sinuses/Orbits: Right cataract resection. CT CERVICAL SPINE FINDINGS Alignment: No traumatic malalignment. C2-3 and C6-7 degenerative anterolisthesis. Skull base and vertebrae: Negative for fracture Soft tissues and spinal canal: No prevertebral fluid or swelling. No visible canal hematoma. Disc levels:  Diffuse degenerative disease. Upper chest:  Apical lungs show interlobular septal thickening consistent with edema. There is also centrilobular nodules of indeterminate  significance. There may have been aspiration. These results were called by telephone at the time of interpretation on 04/06/2017 at 10:07 am to Dr. Cathren Laine , who verbally acknowledged these results. IMPRESSION: Head CT: 1. Possible mild generalized swelling which may reflect early changes of anoxic injury. No hemorrhage or shift. 2. Moderate remote right parietal infarct. Cervical spine CT: 1. Negative for fracture. 2. Edema and mild nodularity seen in the apical lungs. 3. Advanced degenerative disease with C2-3 and C6-7 anterolisthesis. Electronically Signed   By: Marnee Spring M.D.   On: 03/17/2017 10:08   Ct Cervical Spine Wo Contrast  Result Date: 03/19/2017 CLINICAL DATA:  Altered mental status. Emesis and suspected respiratory arrest. CPR for estimated 20-30 minutes. EXAM: CT HEAD WITHOUT CONTRAST CT CERVICAL SPINE WITHOUT CONTRAST TECHNIQUE: Multidetector CT imaging of the head and cervical spine was performed following the standard protocol without intravenous contrast. Multiplanar CT image reconstructions of the cervical spine were also generated. COMPARISON:  None. FINDINGS: CT HEAD FINDINGS Brain: Limited evaluation the brain due to streak artifact. Patchy low-density appearance of the cerebellum is likely primarily related to this artifact. Gyral and folia spacing is less than expected for age, which could be an early sign of edema and anoxic injury. Sulcal narrowing is less prominent on the right where there has been a moderate remote parietal infarct. Vascular: Atherosclerotic calcification. Suspect prior calcified right MCA branch embolus associated with above infarct. Skull: No acute finding. Sinuses/Orbits: Right cataract resection. CT CERVICAL SPINE FINDINGS Alignment: No traumatic malalignment. C2-3 and C6-7 degenerative anterolisthesis. Skull base and vertebrae: Negative for fracture Soft tissues and spinal canal: No prevertebral fluid or swelling. No visible canal hematoma. Disc levels:   Diffuse degenerative disease. Upper chest: Apical lungs show interlobular septal thickening consistent with edema. There is also centrilobular nodules of indeterminate significance. There may have been aspiration. These results were called by telephone at the time of interpretation on 03/29/2017 at 10:07 am to Dr. Cathren Laine , who verbally acknowledged these results. IMPRESSION: Head CT: 1. Possible mild generalized swelling which may reflect early changes of anoxic injury. No hemorrhage or shift. 2. Moderate remote right parietal infarct. Cervical spine CT: 1. Negative for fracture. 2. Edema and mild nodularity seen in the apical lungs. 3. Advanced degenerative disease with C2-3 and C6-7 anterolisthesis. Electronically Signed   By: Marnee Spring M.D.   On: 03/27/2017 10:08   Dg Chest Port 1 View  Result Date: 2017-04-08 CLINICAL DATA:  Intubated EXAM: PORTABLE CHEST 1 VIEW COMPARISON:  03/21/2017 FINDINGS: Endotracheal tube and NG tube are unchanged. Cardiomegaly with vascular congestion. Diffuse interstitial prominence throughout the lungs could reflect chronic interstitial lung disease or interstitial edema. Small bilateral effusions. IMPRESSION: Stable interstitial prominence could reflect interstitial edema or chronic interstitial lung disease. Cardiomegaly, vascular congestion. Small effusions. Electronically Signed   By: Charlett Nose M.D.   On: 04/08/2017 09:51   Dg Chest Port 1 View  Result Date: 04/01/2017 CLINICAL DATA:  Hypoxia EXAM: PORTABLE CHEST 1 VIEW COMPARISON:  March 05, 2015 FINDINGS: Endotracheal tube tip is 3.1 cm above the carina. Nasogastric tube tip and side port are below the diaphragm. No pneumothorax. There is mild interstitial pulmonary edema. No airspace consolidation. There is mild cardiomegaly with pulmonary venous hypertension. There is aortic atherosclerosis. No adenopathy. There is degenerative change in each shoulder. IMPRESSION: Tube positions as described without  pneumothorax. There is  a degree of pulmonary vascular congestion with mild interstitial edema. No consolidation. There is aortic atherosclerosis. Aortic Atherosclerosis (ICD10-I70.0). Electronically Signed   By: Bretta Bang III M.D.   On: 04/05/2017 08:55    Microbiology Recent Results (from the past 240 hour(s))  Blood Culture (routine x 2)     Status: None (Preliminary result)   Collection Time: 04/04/2017  8:46 AM  Result Value Ref Range Status   Specimen Description BLOOD LEFT WRIST  Final   Special Requests IN PEDIATRIC BOTTLE Blood Culture adequate volume  Final   Culture   Final    NO GROWTH 1 DAY Performed at [facility], [address], [place], [place] [address]    Report Status PENDING  Incomplete  Blood Culture (routine x 2)     Status: None (Preliminary result)   Collection Time: 03/25/2017  9:15 AM  Result Value Ref Range Status   Specimen Description BLOOD RIGHT WRIST  Final   Special Requests IN PEDIATRIC BOTTLE Blood Culture adequate volume  Final   Culture   Final    NO GROWTH 1 DAY Performed at [facility], [address], [place], [place] [address]    Report Status PENDING  Incomplete  MRSA PCR Screening     Status: None   Collection Time: 03/13/2017  2:45 PM  Result Value Ref Range Status   MRSA by PCR NEGATIVE NEGATIVE Final    Comment:        The GeneXpert MRSA Assay (FDA approved for NASAL specimens only), is one component of a comprehensive MRSA colonization surveillance program. It is not intended to diagnose MRSA infection nor to guide or monitor treatment for MRSA infections. Performed at [facility], [address] [place], [place] [address]     Lab Basic Metabolic Panel: Recent Labs  Lab 03/23/2017 0813 03/28/2017 0832 03/29/2017 1418 04/09/2017 0445  NA 135 132* 135 136  K 5.8* 6.2* 4.1 4.1  CL 104 103 104 104  CO2 18*  --  19* 17*  GLUCOSE 157* 166* 148* 90  BUN 17 22* 24* 29*  CREATININE 1.16* 1.00 1.19* 1.28*   CALCIUM 9.5  --  8.9 8.6*  MG  --   --   --  1.7  PHOS  --   --  4.1  4.2  --    Liver Function Tests: Recent Labs  Lab 03/17/2017 0813 03/23/2017 1418 09-Apr-2017 0445  AST 448*  --  369*  ALT 506*  --  498*  ALKPHOS 163*  --  136*  BILITOT 1.3*  --  1.1  PROT 5.6*  --  5.2*  ALBUMIN 3.3* 3.2* 3.0*   Recent Labs  Lab 03/30/2017 1418  LIPASE 19   No results for input(s): AMMONIA in the last 168 hours. CBC: Recent Labs  Lab 04/01/2017 0813 03/16/2017 0832 04/09/17 0445  WBC 15.1*  --  13.8*  NEUTROABS 13.4*  --   --   HGB 9.7* 9.9* 10.0*  HCT 30.7* 29.0* 30.5*  MCV 90.8  --  86.9  PLT 157  --  118*   Cardiac Enzymes: Recent Labs  Lab 03/23/2017 1418 03/27/2017 1942 04/09/17 0445  TROPONINI 0.21* 0.29* 0.28*   Sepsis Labs: Recent Labs  Lab 03/12/2017 0813 03/22/2017 0832 03/24/2017 1036 04/02/2017 1418 Apr 09, 2017 0445  PROCALCITON  --   --   --  32.17  --   WBC 15.1*  --   --   --  13.8*  LATICACIDVEN  --  5.66* 4.04*  --   --     Procedures/Operations     Chelsea Browning 03/16/2017, 3:25 PM

## 2017-04-07 NOTE — Procedures (Signed)
Extubation Procedure Note  Patient Details:   Name: Chelsea Browning DOB: [birth year] MRN: [id number]   Airway Documentation:  Airway 7.5 mm (Active)  Secured at (cm) 22 cm 03-16-2017 11:28 AM  Measured From Lips 03/16/17 11:28 AM  Secured Location Right 03/16/2017 11:28 AM  Secured By [employer] 2017/03/16 11:28 AM  Tube Holder Repositioned Yes 03/16/2017 11:28 AM  Cuff Pressure (cm H2O) 28 cm H2O 16-Mar-2017 11:28 AM  Site Condition Dry 16-Mar-2017 11:28 AM    Evaluation  O2 sats: transiently fell during during procedure Complications: Complications of terminal extubation Patient did not tolerate procedure well. Bilateral Breath Sounds: Clear, Diminished, Rhonchi   Yes  RT extubated patient to room per terminal extubation protocol  Dimple Nanas Mar 16, 2017, 3:31 PM

## 2017-04-07 NOTE — Progress Notes (Signed)
Spoke with daughter and family.  Patient is deteriorating rapidly.  They elected comfort care at this point.  Will start morphine and extubate.  Rush Farmer, M.D. [facility] Pulmonary/Critical Care Medicine. Pager: [phone]. After hours pager: [phone].
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
## 2017-04-07 NOTE — Progress Notes (Signed)
Patient expired at 1550hrs. Family by bedside.Dr Nelda Marseille made aware. Death Certified by Earlie Server RN/Christina RN-Charge Nurse. Remainder of Morphine wasted to the Sink and witnessed by [employer].

## 2017-04-07 NOTE — Progress Notes (Signed)
PHARMACY NOTE:  ANTIMICROBIAL RENAL DOSAGE ADJUSTMENT  Current antimicrobial regimen includes a mismatch between antimicrobial dosage and estimated renal function.  As per policy approved by the Pharmacy & Therapeutics and Medical Executive Committees, the antimicrobial dosage will be adjusted accordingly.  Current antimicrobial dosage: cefepime 1gm IV q12h  Indication: PNA  Renal Function: SCr= 1.28, CrCL ~ 40  Estimated Creatinine Clearance: 38.7 mL/min (A) (by C-G formula based on SCr of 1.28 mg/dL (H)).  Antimicrobial dosage has been changed to:  Cefepime 2gm IV q24h   Thank you for allowing pharmacy to be a part of this patient's care.  Hildred Laser, Pharm D 04-03-17 9:57 AM

## 2017-04-07 NOTE — Progress Notes (Signed)
Patient daughter called and stated that they are ready to have the "'breathing tube removed"  Dr Nelda Marseille made aware and will come see patient.

## 2017-04-07 DEATH — deceased

## 2017-04-20 ENCOUNTER — Other Ambulatory Visit: Payer: Self-pay | Admitting: Nurse Practitioner

## 2018-11-22 IMAGING — DX DG CHEST 1V PORT
1 series · 1 of 1 positions shown · non-contrast
Comparison: 03/14/2017

CLINICAL DATA: Intubated

EXAM:
PORTABLE CHEST 1 VIEW

[chest ap]
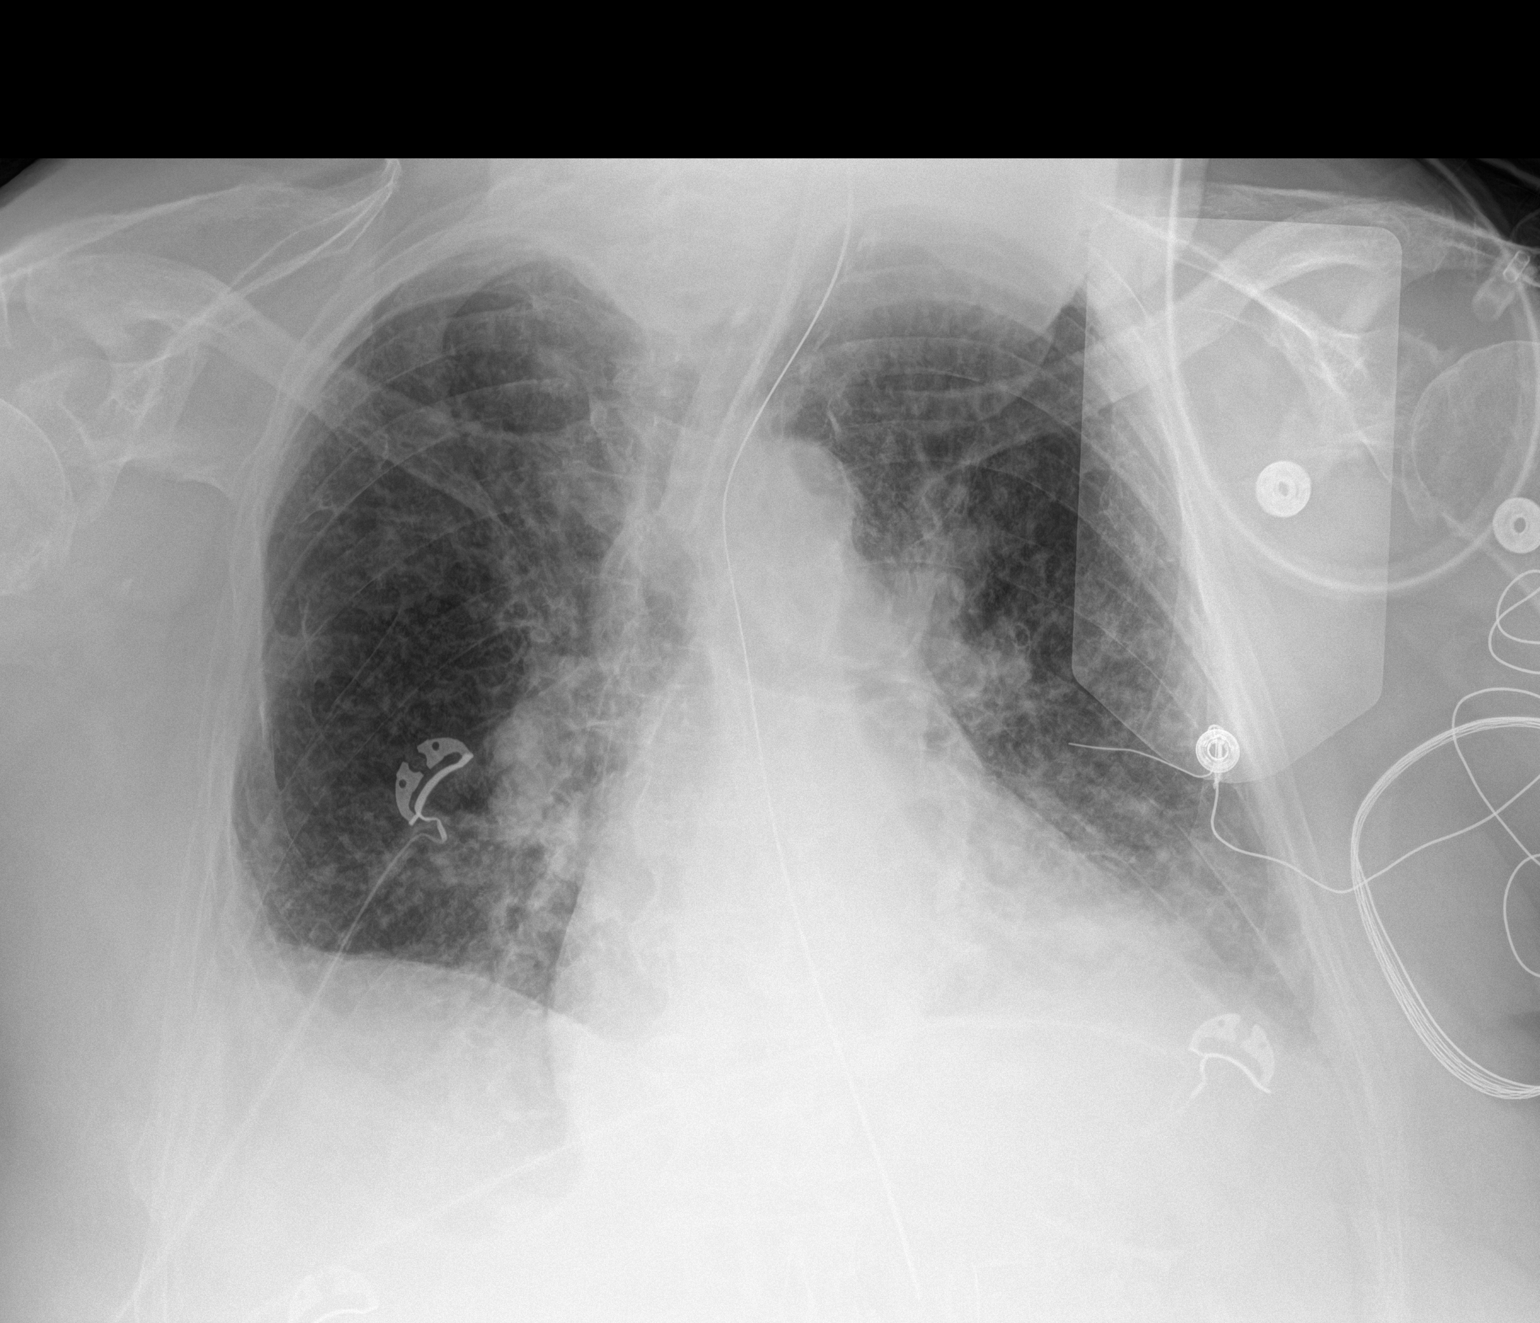

[1 of 1 positions shown; findings below may reference images not displayed]

FINDINGS: Endotracheal tube and NG tube are unchanged. Cardiomegaly with
vascular congestion. Diffuse interstitial prominence throughout the
lungs could reflect chronic interstitial lung disease or
interstitial edema. Small bilateral effusions.
IMPRESSION: Stable interstitial prominence could reflect interstitial edema or
chronic interstitial lung disease.

Cardiomegaly, vascular congestion.

Small effusions.
